# Patient Record
Sex: Female | Born: 1960
Health system: Southern US, Community
[De-identification: ages and names within clinical notes are randomized; demographics above are authoritative.]

## PROBLEM LIST (undated history)

## (undated) DIAGNOSIS — Z803 Family history of malignant neoplasm of breast: Secondary | ICD-10-CM

## (undated) DIAGNOSIS — Z973 Presence of spectacles and contact lenses: Secondary | ICD-10-CM

## (undated) DIAGNOSIS — Z9889 Other specified postprocedural states: Secondary | ICD-10-CM

## (undated) DIAGNOSIS — F419 Anxiety disorder, unspecified: Secondary | ICD-10-CM

## (undated) DIAGNOSIS — Z889 Allergy status to unspecified drugs, medicaments and biological substances status: Secondary | ICD-10-CM

## (undated) DIAGNOSIS — C50919 Malignant neoplasm of unspecified site of unspecified female breast: Secondary | ICD-10-CM

## (undated) DIAGNOSIS — R112 Nausea with vomiting, unspecified: Secondary | ICD-10-CM

## (undated) DIAGNOSIS — Z8042 Family history of malignant neoplasm of prostate: Secondary | ICD-10-CM

## (undated) DIAGNOSIS — Z8041 Family history of malignant neoplasm of ovary: Secondary | ICD-10-CM

## (undated) DIAGNOSIS — Z8489 Family history of other specified conditions: Secondary | ICD-10-CM

## (undated) DIAGNOSIS — G43909 Migraine, unspecified, not intractable, without status migrainosus: Secondary | ICD-10-CM

## (undated) DIAGNOSIS — R011 Cardiac murmur, unspecified: Secondary | ICD-10-CM

## (undated) HISTORY — DX: Family history of malignant neoplasm of prostate: Z80.42

## (undated) HISTORY — DX: Family history of malignant neoplasm of ovary: Z80.41

## (undated) HISTORY — DX: Family history of malignant neoplasm of breast: Z80.3

---

## 1994-02-18 HISTORY — PX: PORT-A-CATH REMOVAL: SHX5289

## 1994-02-18 HISTORY — PX: BREAST SURGERY: SHX581

## 1994-02-18 HISTORY — PX: PORTACATH PLACEMENT: SHX2246

## 1994-02-18 HISTORY — PX: BREAST BIOPSY: SHX20

## 1994-02-18 HISTORY — PX: MASTECTOMY: SHX3

## 1994-02-18 HISTORY — PX: TUNNELED VENOUS CATHETER PLACEMENT: SHX818

## 1998-06-15 ENCOUNTER — Other Ambulatory Visit: Admission: RE | Admit: 1998-06-15 | Discharge: 1998-06-15 | Payer: Self-pay | Admitting: Obstetrics and Gynecology

## 1999-02-19 HISTORY — PX: BREAST RECONSTRUCTION: SHX9

## 1999-03-21 ENCOUNTER — Inpatient Hospital Stay (HOSPITAL_COMMUNITY): Admission: RE | Admit: 1999-03-21 | Discharge: 1999-03-24 | Payer: Self-pay | Admitting: Plastic Surgery

## 1999-03-21 ENCOUNTER — Encounter (INDEPENDENT_AMBULATORY_CARE_PROVIDER_SITE_OTHER): Payer: Self-pay | Admitting: Specialist

## 1999-06-29 ENCOUNTER — Ambulatory Visit (HOSPITAL_BASED_OUTPATIENT_CLINIC_OR_DEPARTMENT_OTHER): Admission: RE | Admit: 1999-06-29 | Discharge: 1999-06-29 | Payer: Self-pay | Admitting: Plastic Surgery

## 1999-07-05 ENCOUNTER — Encounter: Admission: RE | Admit: 1999-07-05 | Discharge: 1999-07-05 | Payer: Self-pay | Admitting: Oncology

## 1999-07-05 ENCOUNTER — Encounter: Payer: Self-pay | Admitting: Oncology

## 1999-07-19 ENCOUNTER — Other Ambulatory Visit: Admission: RE | Admit: 1999-07-19 | Discharge: 1999-07-19 | Payer: Self-pay | Admitting: Obstetrics and Gynecology

## 1999-08-30 ENCOUNTER — Ambulatory Visit (HOSPITAL_COMMUNITY): Admission: RE | Admit: 1999-08-30 | Discharge: 1999-08-30 | Payer: Self-pay | Admitting: Obstetrics and Gynecology

## 2000-06-02 ENCOUNTER — Encounter: Payer: Self-pay | Admitting: Oncology

## 2000-06-02 ENCOUNTER — Encounter: Admission: RE | Admit: 2000-06-02 | Discharge: 2000-06-02 | Payer: Self-pay | Admitting: Oncology

## 2000-06-06 ENCOUNTER — Ambulatory Visit (HOSPITAL_COMMUNITY): Admission: RE | Admit: 2000-06-06 | Discharge: 2000-06-06 | Payer: Self-pay | Admitting: Oncology

## 2000-06-13 ENCOUNTER — Encounter: Payer: Self-pay | Admitting: Neurology

## 2000-06-13 ENCOUNTER — Ambulatory Visit (HOSPITAL_COMMUNITY): Admission: RE | Admit: 2000-06-13 | Discharge: 2000-06-13 | Payer: Self-pay | Admitting: Neurology

## 2000-10-30 ENCOUNTER — Encounter: Payer: Self-pay | Admitting: Oncology

## 2000-10-30 ENCOUNTER — Encounter: Admission: RE | Admit: 2000-10-30 | Discharge: 2000-10-30 | Payer: Self-pay | Admitting: Oncology

## 2001-01-05 ENCOUNTER — Other Ambulatory Visit: Admission: RE | Admit: 2001-01-05 | Discharge: 2001-01-05 | Payer: Self-pay | Admitting: Obstetrics and Gynecology

## 2001-11-02 ENCOUNTER — Encounter: Admission: RE | Admit: 2001-11-02 | Discharge: 2001-11-02 | Payer: Self-pay | Admitting: Oncology

## 2001-11-02 ENCOUNTER — Encounter: Payer: Self-pay | Admitting: Oncology

## 2002-02-10 ENCOUNTER — Other Ambulatory Visit: Admission: RE | Admit: 2002-02-10 | Discharge: 2002-02-10 | Payer: Self-pay | Admitting: Obstetrics and Gynecology

## 2002-11-08 ENCOUNTER — Encounter: Payer: Self-pay | Admitting: Oncology

## 2002-11-08 ENCOUNTER — Encounter: Admission: RE | Admit: 2002-11-08 | Discharge: 2002-11-08 | Payer: Self-pay | Admitting: Oncology

## 2002-11-18 ENCOUNTER — Encounter: Admission: RE | Admit: 2002-11-18 | Discharge: 2002-11-18 | Payer: Self-pay | Admitting: Oncology

## 2002-11-18 ENCOUNTER — Encounter: Payer: Self-pay | Admitting: Oncology

## 2003-02-23 ENCOUNTER — Other Ambulatory Visit: Admission: RE | Admit: 2003-02-23 | Discharge: 2003-02-23 | Payer: Self-pay | Admitting: Obstetrics and Gynecology

## 2003-05-06 ENCOUNTER — Encounter: Admission: RE | Admit: 2003-05-06 | Discharge: 2003-05-06 | Payer: Self-pay | Admitting: Oncology

## 2003-11-09 ENCOUNTER — Encounter: Admission: RE | Admit: 2003-11-09 | Discharge: 2003-11-09 | Payer: Self-pay | Admitting: Oncology

## 2004-02-24 ENCOUNTER — Ambulatory Visit: Payer: Self-pay | Admitting: Oncology

## 2004-03-20 ENCOUNTER — Other Ambulatory Visit: Admission: RE | Admit: 2004-03-20 | Discharge: 2004-03-20 | Payer: Self-pay | Admitting: Obstetrics and Gynecology

## 2004-06-26 ENCOUNTER — Encounter: Admission: RE | Admit: 2004-06-26 | Discharge: 2004-06-26 | Payer: Self-pay | Admitting: Oncology

## 2004-08-27 ENCOUNTER — Ambulatory Visit: Payer: Self-pay | Admitting: Oncology

## 2005-03-29 ENCOUNTER — Other Ambulatory Visit: Admission: RE | Admit: 2005-03-29 | Discharge: 2005-03-29 | Payer: Self-pay | Admitting: Obstetrics and Gynecology

## 2005-06-27 ENCOUNTER — Encounter: Admission: RE | Admit: 2005-06-27 | Discharge: 2005-06-27 | Payer: Self-pay | Admitting: Oncology

## 2005-07-18 ENCOUNTER — Encounter: Admission: RE | Admit: 2005-07-18 | Discharge: 2005-07-18 | Payer: Self-pay | Admitting: Oncology

## 2005-08-15 ENCOUNTER — Ambulatory Visit: Payer: Self-pay | Admitting: Oncology

## 2005-08-23 LAB — COMPREHENSIVE METABOLIC PANEL
ALT: 15 U/L (ref 0–40)
AST: 13 U/L (ref 0–37)
Albumin: 4.5 g/dL (ref 3.5–5.2)
Alkaline Phosphatase: 54 U/L (ref 39–117)
Calcium: 9 mg/dL (ref 8.4–10.5)
Chloride: 103 mEq/L (ref 96–112)
Potassium: 4.1 mEq/L (ref 3.5–5.3)
Sodium: 138 mEq/L (ref 135–145)
Total Protein: 7.6 g/dL (ref 6.0–8.3)

## 2005-08-23 LAB — CBC WITH DIFFERENTIAL/PLATELET
Basophils Absolute: 0 10*3/uL (ref 0.0–0.1)
EOS%: 1.4 % (ref 0.0–7.0)
HCT: 36.8 % (ref 34.8–46.6)
MCHC: 33.5 g/dL (ref 32.0–36.0)
MONO#: 0.5 10*3/uL (ref 0.1–0.9)
MONO%: 8.6 % (ref 0.0–13.0)
Platelets: 302 10*3/uL (ref 145–400)
WBC: 5.3 10*3/uL (ref 3.9–10.0)

## 2006-06-30 ENCOUNTER — Encounter: Admission: RE | Admit: 2006-06-30 | Discharge: 2006-06-30 | Payer: Self-pay | Admitting: Oncology

## 2006-07-08 ENCOUNTER — Encounter: Admission: RE | Admit: 2006-07-08 | Discharge: 2006-07-08 | Payer: Self-pay | Admitting: Oncology

## 2006-08-19 ENCOUNTER — Ambulatory Visit: Payer: Self-pay | Admitting: Oncology

## 2006-08-27 LAB — CBC WITH DIFFERENTIAL/PLATELET
BASO%: 0.5 % (ref 0.0–2.0)
Basophils Absolute: 0 10*3/uL (ref 0.0–0.1)
EOS%: 4.7 % (ref 0.0–7.0)
HGB: 11.8 g/dL (ref 11.6–15.9)
MCH: 30.9 pg (ref 26.0–34.0)
MCHC: 34.5 g/dL (ref 32.0–36.0)
MCV: 89.7 fL (ref 81.0–101.0)
MONO%: 9.7 % (ref 0.0–13.0)
RBC: 3.8 10*6/uL (ref 3.70–5.32)
RDW: 14.2 % (ref 11.3–14.5)
lymph#: 1.6 10*3/uL (ref 0.9–3.3)

## 2006-08-27 LAB — COMPREHENSIVE METABOLIC PANEL
ALT: 13 U/L (ref 0–35)
AST: 14 U/L (ref 0–37)
Albumin: 4.1 g/dL (ref 3.5–5.2)
Alkaline Phosphatase: 51 U/L (ref 39–117)
BUN: 12 mg/dL (ref 6–23)
Potassium: 4.2 mEq/L (ref 3.5–5.3)

## 2007-07-02 ENCOUNTER — Encounter: Admission: RE | Admit: 2007-07-02 | Discharge: 2007-07-02 | Payer: Self-pay | Admitting: Oncology

## 2007-07-15 ENCOUNTER — Ambulatory Visit: Payer: Self-pay | Admitting: Oncology

## 2007-07-17 ENCOUNTER — Encounter: Admission: RE | Admit: 2007-07-17 | Discharge: 2007-07-17 | Payer: Self-pay | Admitting: Oncology

## 2007-08-24 ENCOUNTER — Ambulatory Visit: Payer: Self-pay | Admitting: Oncology

## 2007-08-26 LAB — CBC WITH DIFFERENTIAL/PLATELET
BASO%: 0.2 % (ref 0.0–2.0)
EOS%: 2 % (ref 0.0–7.0)
HCT: 34.5 % — ABNORMAL LOW (ref 34.8–46.6)
LYMPH%: 30 % (ref 14.0–48.0)
MCH: 30 pg (ref 26.0–34.0)
MCHC: 33.6 g/dL (ref 32.0–36.0)
MCV: 89.3 fL (ref 81.0–101.0)
MONO#: 0.4 10*3/uL (ref 0.1–0.9)
MONO%: 8 % (ref 0.0–13.0)
NEUT%: 59.8 % (ref 39.6–76.8)
Platelets: 281 10*3/uL (ref 145–400)

## 2007-08-26 LAB — COMPREHENSIVE METABOLIC PANEL
ALT: 13 U/L (ref 0–35)
CO2: 21 mEq/L (ref 19–32)
Creatinine, Ser: 0.8 mg/dL (ref 0.40–1.20)
Total Bilirubin: 0.8 mg/dL (ref 0.3–1.2)

## 2008-07-04 ENCOUNTER — Encounter: Admission: RE | Admit: 2008-07-04 | Discharge: 2008-07-04 | Payer: Self-pay | Admitting: Oncology

## 2008-09-05 ENCOUNTER — Ambulatory Visit: Payer: Self-pay | Admitting: Oncology

## 2008-09-07 LAB — CBC WITH DIFFERENTIAL/PLATELET
BASO%: 0.5 % (ref 0.0–2.0)
EOS%: 1.8 % (ref 0.0–7.0)
HCT: 36.6 % (ref 34.8–46.6)
LYMPH%: 36.8 % (ref 14.0–49.7)
MCH: 30.1 pg (ref 25.1–34.0)
MCHC: 33.2 g/dL (ref 31.5–36.0)
NEUT%: 52.5 % (ref 38.4–76.8)
Platelets: 312 10*3/uL (ref 145–400)
RBC: 4.03 10*6/uL (ref 3.70–5.45)

## 2008-09-07 LAB — COMPREHENSIVE METABOLIC PANEL
ALT: 17 U/L (ref 0–35)
AST: 21 U/L (ref 0–37)
Alkaline Phosphatase: 61 U/L (ref 39–117)
Creatinine, Ser: 0.81 mg/dL (ref 0.40–1.20)
Sodium: 138 mEq/L (ref 135–145)
Total Bilirubin: 0.6 mg/dL (ref 0.3–1.2)

## 2008-09-30 ENCOUNTER — Ambulatory Visit (HOSPITAL_COMMUNITY): Admission: RE | Admit: 2008-09-30 | Discharge: 2008-09-30 | Payer: Self-pay | Admitting: Oncology

## 2009-02-18 HISTORY — PX: ABDOMINAL HYSTERECTOMY: SHX81

## 2009-07-06 ENCOUNTER — Encounter: Admission: RE | Admit: 2009-07-06 | Discharge: 2009-07-06 | Payer: Self-pay | Admitting: Oncology

## 2009-08-22 ENCOUNTER — Ambulatory Visit (HOSPITAL_COMMUNITY): Admission: RE | Admit: 2009-08-22 | Discharge: 2009-08-22 | Payer: Self-pay | Admitting: Anesthesiology

## 2009-08-24 ENCOUNTER — Inpatient Hospital Stay (HOSPITAL_COMMUNITY): Admission: RE | Admit: 2009-08-24 | Discharge: 2009-08-26 | Payer: Self-pay | Admitting: Obstetrics and Gynecology

## 2009-08-24 ENCOUNTER — Encounter (INDEPENDENT_AMBULATORY_CARE_PROVIDER_SITE_OTHER): Payer: Self-pay | Admitting: Obstetrics and Gynecology

## 2009-09-04 ENCOUNTER — Ambulatory Visit: Payer: Self-pay | Admitting: Oncology

## 2009-09-06 LAB — COMPREHENSIVE METABOLIC PANEL
ALT: 15 U/L (ref 0–35)
AST: 17 U/L (ref 0–37)
Calcium: 9.8 mg/dL (ref 8.4–10.5)
Potassium: 4 mEq/L (ref 3.5–5.3)
Sodium: 139 mEq/L (ref 135–145)
Total Bilirubin: 1.2 mg/dL (ref 0.3–1.2)

## 2009-09-06 LAB — CBC WITH DIFFERENTIAL/PLATELET
Basophils Absolute: 0 10*3/uL (ref 0.0–0.1)
Eosinophils Absolute: 0.1 10*3/uL (ref 0.0–0.5)
MCH: 29.2 pg (ref 25.1–34.0)
MCV: 91 fL (ref 79.5–101.0)
MONO#: 0.3 10*3/uL (ref 0.1–0.9)
NEUT#: 4.6 10*3/uL (ref 1.5–6.5)
RDW: 13.3 % (ref 11.2–14.5)
lymph#: 1.6 10*3/uL (ref 0.9–3.3)

## 2009-10-06 ENCOUNTER — Encounter: Admission: RE | Admit: 2009-10-06 | Discharge: 2009-10-06 | Payer: Self-pay | Admitting: Oncology

## 2010-03-10 ENCOUNTER — Other Ambulatory Visit: Payer: Self-pay | Admitting: Oncology

## 2010-03-10 DIAGNOSIS — Z853 Personal history of malignant neoplasm of breast: Secondary | ICD-10-CM

## 2010-03-10 DIAGNOSIS — Z0000 Encounter for general adult medical examination without abnormal findings: Secondary | ICD-10-CM

## 2010-04-24 ENCOUNTER — Other Ambulatory Visit: Payer: Self-pay | Admitting: Obstetrics and Gynecology

## 2010-05-06 LAB — URINALYSIS, ROUTINE W REFLEX MICROSCOPIC
Bilirubin Urine: NEGATIVE
Glucose, UA: NEGATIVE mg/dL
Specific Gravity, Urine: >1.03 — ABNORMAL HIGH (ref 1.005–1.030)
pH: 5.5 (ref 5.0–8.0)

## 2010-05-06 LAB — SURGICAL PCR SCREEN: Staphylococcus aureus: NEGATIVE

## 2010-05-06 LAB — COMPREHENSIVE METABOLIC PANEL
Albumin: 3.8 g/dL (ref 3.5–5.2)
Alkaline Phosphatase: 51 U/L (ref 39–117)
Calcium: 8.9 mg/dL (ref 8.4–10.5)
Chloride: 106 mEq/L (ref 96–112)
GFR calc Af Amer: >60 mL/min (ref >60)
GFR calc non Af Amer: >60 mL/min (ref >60)
Potassium: 3.6 mEq/L (ref 3.5–5.1)

## 2010-05-06 LAB — CBC
Hemoglobin: 10.9 g/dL — ABNORMAL LOW (ref 12.0–15.0)
Hemoglobin: 11.4 g/dL — ABNORMAL LOW (ref 12.0–15.0)
MCH: 30.3 pg (ref 26.0–34.0)
Platelets: 212 10*3/uL (ref 150–400)
Platelets: 258 10*3/uL (ref 150–400)
RBC: 3.51 MIL/uL — ABNORMAL LOW (ref 3.87–5.11)
RBC: 3.75 MIL/uL — ABNORMAL LOW (ref 3.87–5.11)
RDW: 13.7 % (ref 11.5–15.5)

## 2010-05-06 LAB — URINE MICROSCOPIC-ADD ON

## 2010-05-06 LAB — APTT: aPTT: 30 seconds (ref 24–37)

## 2010-05-06 LAB — PREGNANCY, URINE: Preg Test, Ur: NEGATIVE

## 2010-05-06 LAB — PROTIME-INR: INR: 1.08 (ref 0.00–1.49)

## 2010-07-06 NOTE — Op Note (Signed)
[facility]  Patient:    Heidi Sexton, Heidi Sexton                   MRN: [id number] Proc. Date: 08/30/99 Adm. Date:  [id number] Attending:  Miguel Aschoff                           Operative Report  PREOPERATIVE DIAGNOSIS:       Intrauterine pregnancy at 10 weeks for indicated termination secondary to the patients breast carcinoma.  Desired sterilization.  POSTOPERATIVE DIAGNOSIS:      Intrauterine pregnancy at 10 weeks for indicated termination secondary to the patients breast carcinoma.  Desired sterilization.  OPERATION:                    Suction curettage followed by laparoscopic tubal sterilization using cautery and division.  SURGEON:                      Miguel Aschoff, M.D.  ASSISTANT:  ANESTHESIA:                   General anesthesia.  COMPLICATIONS:                None.  INDICATIONS:                  The patient is a 50 year old black female approximately [redacted] weeks pregnant, now 4-1/2 years out from treatment for breast carcinoma with high dose chemotherapy and radiation.  In view of her breast carcinoma status and fears that an ongoing pregnancy could increase chances for  recurrence, the patient is now being taken to undergo elective termination of pregnancy.  In addition, she has requested that a sterilization procedure be performed at this time and has given her informed consent for tubal sterilization.  DESCRIPTION OF PROCEDURE:     The patient was taken to the operating room and placed in the supine position.  General anesthesia was administered without difficulty.  She was then placed in the dorsal lithotomy position and prepped and draped in the usual sterile fashion.  The bladder was catheterized.  A speculum was placed in the vaginal vault.  The anterior cervical lip was grasped with a tenaculum and then the endocervical canal was dilated using serial Pratt dilators until a #33 Pratt dilator could be passed.  Then using a #10 vacuum  curet, the contents of the uterine cavity were evacuated without difficulty.  Sharp curettage was carried out following this with only a small amount of additional tissue being obtained.  Then a final pass was made with the suction curet.  After this portion of the procedure was completed, a Hulka tenaculum was placed through the cervix and held.  Attention was then directed to the umbilicus where a small infraumbilical incision was made.  The Veress needle was then inserted and the abdomen was insufflated with 3 liters of CO2.  After this was done, the trocar to the laparoscope was placed.  The laparoscope was then introduced.  Systematic inspection revealed the uterus to be globular, now top normal size.  The bladder peritoneum was unremarkable.  The round ligaments were unremarkable.  The tubes  were normal along their course.  Fimbria were fine and delicate.  The ovaries were totally within normal limits.  The cul-de-sac was unremarkable.  The only remarkable finding was the inspection of the upper abdomen revealed banjo-type string adhesions running  from the surface of the liver to the anterior abdominal wall consistent with Curtis-Fitz-Hugh, hepatitis syndrome.  No lesions were noted within the liver.  At this point a second puncture was placed suprapubically and a 5 mm port was established.  Then the bipolar cautery forceps were introduced and the midportion of each tube was cauterized for 3 cm along their course and then  this area of cauterization was divided using laparoscopic scissors with good hemostasis and good separation.  Once this was done, a final inspection was made for hemostasis which was excellent.  The procedure was completed.  The CO2 was allowed to escape.  All instruments were removed and the incisions were closed using subcuticular 4-0 Vicryl.  The plan is for the patient to be discharged home.  Medications for home include Doxycycline 100 mg twice  a day x 3 days and Tylox one every three hours as needed for pain.  The patient is to call if there are any problems such as fever, pain, or heavy bleeding.  She is to follow up in four weeks for follow-up examination. DD:  08/30/99 TD:  08/30/99 Job: [id number]

## 2010-07-06 NOTE — Op Note (Signed)
[place]. [facility]  Patient:    Heidi Sexton, Heidi Sexton                   MRN: [id number] Proc. Date: 06/29/99 Adm. Date:  [id number] Disc. Date: [id number] Attending:  Loura Halt Ii                           Operative Report  PREOPERATIVE DIAGNOSES: 1. Absence of left nipple areolar complex. 2. History of breast cancer with mastectomy previously performed.  POSTOPERATIVE DIAGNOSES: 1. Absence of left nipple areolar complex. 2. History of breast cancer with mastectomy previously performed.  OPERATION PERFORMED:  Left nipple reconstruction.  SURGEON:  Alfredia Ferguson, M.D.  ANESTHESIA:  None required.  INDICATIONS FOR SURGERY:  This is a 50 year old woman who has previously had a mastectomy and radiation therapy.  She had significant radiation injury to her skin and subsequently underwent resection of the radiated skin and placement of a TRAM flap.  The patient has done quite well and is now ready for her nipple reconstruction.  The left breast was marked with a Band-Aid with the patient in a sitting position for location of the nipple related to her opposite side.  DESCRIPTION OF SURGERY:  A 45 mm diameter circle was drawn in the central portion of the reconstructed breast where the Band-Aid had been placed.  The left reconstructed breast was then prepped and draped in a sterile fashion.  A tripartite flap was marked, which was inferiorly based, using a 2.5 cm random skin attachment.  The three point of the flap were pointing to the 3 oclock, 12 oclock, and 9 oclock positions.  The flap was incised down to the remaining connection of the skin with the three points appearing like the points on a star.  The tips of each of the points were now excised.  These were skin and subcutaneous tissue flaps.  The 3 oclock and 9 oclock flaps were rolled towards one another and fixed with one another using interrupted 4-0 chromic suture.  This created a  cylinder.  The 12 oclock flap was then laid down upon the top of this cylinder and fixed into position using interrupted 4-0 chromic.  This created the nipple.  The donor site was then closed using multiple, interrupted, 4-0 Vicryl suture for the dermis.  The skin was united using a running 4-0 chromic suture.  The viability of the nipple flap appeared to be excellent at the conclusion of the procedure.  A bulky dressing was applied.  The patient was discharged to home in satisfactory condition. DD:  06/29/99 TD:  07/02/99 Job: [id number]

## 2010-07-06 NOTE — H&P (Signed)
[place]. [facility]  Patient:    Heidi Sexton                    MRN: [id number] Adm. Date:  [id number] Attending:  Loura Halt Ii                         History and Physical  ADMISSION DIAGNOSES: 1. Acquired absence, left breast. 2. Personal history of breast cancer.  CHIEF COMPLAINT:  "My left breast has been removed and I wish to have it reconstructed."  HISTORY OF PRESENT ILLNESS:  This is a 50 year old black female, admitted for left breast reconstruction.  The patient states that she had breast cancer in 1996 and underwent a left modified radical mastectomy.  She had 13 of 23 lymph nodes positive for metastatic disease.  Patient subsequently underwent five cycles of  Adriamycin and Cytoxan, followed by high-dose chemotherapy and bone marrow rescue. Patient had radiation therapy to the left chest wall and axilla.  Patient has had no evidence of disease since 1996.  She is doing well, active and wishes to proceed with delayed breast reconstruction.  PAST MEDICAL HISTORY:  Negative other than those presented in history of present illness.  PAST SURGICAL HISTORY:  Significant for a left modified radical mastectomy and axillary dissection in 1996.  ALLERGIES:  Patient is allergic to TAPE.  SOCIAL HISTORY:  Patient is married and has one child who is 5 years old.  She interestingly was pregnant when she developed breast cancer and the baby remained in utero during two cycles of her chemotherapy.  Patient denies smoking.  REVIEW OF SYSTEMS:  Patient denies any history of diarrhea or constipation. She denies any history of hematuria or dysuria.  She has had no recent weight loss.  She denies chest pain, shortness of breath or dyspnea on exertion.  There is no  history of weakness in her extremities, although she does have some residual numbness on her chest wall.  Patient denies any psychiatric illnesses, visual problems or  dermatologic problems.  Patient has occasional jaw pain which may be related to a history of TMJ problems.  FAMILY HISTORY:  There is no significant family history of breast cancer.  She as a distant relative who had breast cancer on her fathers side.  She has a cousin  who had a brain tumor and an uncle who had leukemia.  PHYSICAL EXAMINATION:  GENERAL:  Physical exam reveals a woman who is 5-feet 7-inches tall and weighs 57 pounds.  HEENT:  Within normal limits.  CHEST:  Clear to auscultation.  CARDIAC:  Normal S1 and S2, without murmurs or gallops.  BREASTS:  Exam reveals a slight ptotic B-cup breast on the right with no dominant masses.  On the left, there is surgical absence of the breast.  The skin is densely adherent to the chest wall secondary to radiation therapy.  There is a scar contracture which extends up into the axilla, which is a continuation of her mastectomy incision.  There is no axillary adenopathy in either side.  ABDOMEN:  Exam reveals no evidence of masses, tenderness, diastasis or herniae.  There is a very small panniculus.  ASSESSMENT ON ADMISSION:  Acquired absence of the left breast.  PLAN:  Delayed TRAM flap reconstruction. DD:  03/21/99 TD:  03/21/99 Job: [id number]

## 2010-07-06 NOTE — Discharge Summary (Signed)
[place]. [facility]  Patient:    Heidi Sexton                    MRN: [id number] Adm. Date:  [id number] Disc. Date: [id number] Attending:  Loura Halt Ii                           Discharge Summary  ADMISSION DIAGNOSES: 1. Acquired absence left breast. 2. Personal history of left breast cancer.  DISCHARGE DIAGNOSES: 1. Acquired absence left breast. 2. Personal history of left breast cancer.  OPERATIONS:  Delayed breast reconstruction utilizing a left transverse rectus abdominis myocutaneous flap performed on March 21, 1999.  CHIEF COMPLAINT:  "My left breast has been removed, and I wish to have a reconstruction."  HISTORY OF PRESENT ILLNESS:  [age]-year-old black female admitted to the hospital for delayed breast reconstruction.  She had breast cancer in 1996, which was treated with mastectomy, radiation therapy, and chemotherapy.  She had 13 positive nodes at the time of mastectomy.  She has been disease-free for the last four years, and wishes now to proceed with breast reconstruction.  She has a very small abdominal panniculus, and knows that we will not be able to make her the exact same size as the opposite side.  PAST MEDICAL HISTORY:  Negative, other than that presented within the history of present illness.  PAST SURGICAL HISTORY:  Left modified radical mastectomy in 1996.  ALLERGIES:  TAPE.  SOCIAL HISTORY:  The patient is married, has one child.  She does not smoke.  REVIEW OF SYSTEMS:  Please see admission history and physical for review of systems.  PHYSICAL EXAMINATION:  Please see admission H&P for complete physical exam.  ADMISSION LABORATORY DATA:  Hemoglobin of 11.8, hematocrit of 36.5.  Urinalysis was negative.  No EKG was performed.  No chest x-ray was performed.  HOSPITAL COURSE:  On the day of admission, the patient was taken to the operating room where she underwent an uncomplicated  transverse rectus abdominis myocutaneous flap.  Postoperative course has been completely uncomplicated.  On the first postoperative day the patient was started on a diet, her IV was discontinued, and she was switched to oral medications.  On the second postoperative day the patient is ambulating without assistance.  She has three remaining drains in place, with she will go home with.  She is tolerating her oral medications.  She did have some transient nausea on the afternoon of discharge.  She also developed a bit of anemia postoperatively.  Her hemoglobin and hematocrit were 9.0 and 26.6 at discharge.  DISCHARGE MEDICATIONS: 1. Tylox 1-2 q.4h. p.r.n. pain. 2. Keflex 500 mg four times a day. 3. She was advised to take iron and multivitamins once at home.  DISCHARGE INSTRUCTIONS:  Discharge instructions were given to the patient which  included emptying and recording the drain output twice a day.  She was instructed on doing dressing changes around the drain.  However, no other dressing changes  were required.  FOLLOW-UP:  Will be provided in six days in my office.  If her drainage reduces to below 30 cc per 24 hours, the patient was told that she could call the office, nd we would be happy to remove the drains prior to her scheduled appointment.  The patient understands her discharge instructions, and is willing to comply. DD:  03/23/99 TD:  03/24/99 Job: [id number]

## 2010-07-06 NOTE — Op Note (Signed)
[place]. [facility]  Patient:    Heidi Sexton                    MRN: [id number] Proc. Date: 03/21/99 Adm. Date:  [id number] Attending:  Loura Halt Ii                           Operative Report  PREOPERATIVE DIAGNOSES: 1. Acquired absence of left breast. 2. Severe radiation injury to skin of left anterior chest. 3. History of breast cancer.  POSTOPERATIVE DIAGNOSES: 1. Acquired absence of left breast. 2. Severe radiation injury to skin of left anterior chest. 3. History of breast cancer.  PROCEDURES: 1. Excision of radiated skin, left anterior chest with recreation of mastectomy  flaps. 2. Delayed reconstruction utilizing a left epsilateral transverse rectus abdominis    myocutaneous flap.  SURGEON:  Alfredia Ferguson, M.D.  ASSISTANT:  Janet Berlin. Dan Humphreys, M.D.  ANESTHESIA:  General endotracheal anesthesia.  INDICATIONS:  This is a 50 year old black female, who is 4 years status post left modified radical mastectomy with 13 positive nodes.  She was treated with radiation therapy and chemotherapy.  She is showing no evidence of disease and wishes to undergo delayed reconstruction of her breast.  Patient is noted to have severe tightness of the skin of her chest with dense adherence to the anterior chest wall. The inferior half of the pectoralis muscle was atrophied and the skin is stuck o the ribs.  The scar continues up into the anterior axilla with a contracture. Patient wishes to have this scar released and reconstruction of her breast. She understands that the amount of tissue she has in her abdomen is inadequate to reconstruct a breast of same size as the opposite side.  In addition, because she is so small, there is inadequate amount of skin to replace all of the radiation  injured skin of the chest.  She understands the risk of partial or complete loss of the flap, being made too small, being made too large, wound  healing difficulties, abdominal hernias, vascular compromise to the naval and wound breakdown both in the chest and the abdomen.  Inspite of these and other risks discussed with the patient, she wished to proceed with the operation.  DESCRIPTION OF SURGERY:  Skin marks were placed outlining the maximum skin paddle dimensions of her abdomen the day prior to surgery.  The patient was taken to the operating room, where she was given general endotracheal anesthesia.  The attention was first directed to the left anterior chest.  The skin that was densely adherent and atrophied that was stuck to the ribs was excised.  This was approximately 4 cm in width and extended from the medial inferior corner of the left anterior chest up to the anterior axilla.  Following removal of this skin using sharp dissection,  essentially peeling it off the ribs, the inferior and superior breast flaps were elevated.  These were densely adherent inferiorly.  Superiorly the upper half of the pectoralis muscle was still present and was a bit easier to elevate the superior breast flap.  Essentially, I elevated the breast flaps back to virgin territory, where it had not been previously dissected.  This allowed marked release of the chest wall skin.  In doing this, the wound became quite wide, approximately 12-14 cm in width as it diagonally stretched from inferior medial corner to the  anterior axilla.  Following recreation  of the mastectomy flaps, a 10 mm blake drain was placed in the axillary gutter and brought out through a separate stab incision and moist packs were placed in the mastectomy defect.  Attention was now directed to the abdomen.  The upper skin incision of the skin paddle was made and dissected down to the anterior abdominal fascia.  The anterior abdominal skin flap was elevated to the level of the costal margins bilaterally in the xiphoid in the midline.  A circular incision was now  made around the umbilicus and the umbilicus along with a healthy stalk of fatty tissue was dissected away from the skin paddle. Patient was temporarily placed in a sitting position in order to assure that the upper abdominal flap could close to the anticipated line of incision for the inferior skin paddle.  Once I was certain that it could close without undue tension, the patient was returned to a supine position.  The lower skin incision was made and deepened until reaching the anterior abdominal wall fascia.  I opted to use an epsilateral muscle.  The right corner of the skin paddle was elevated off the anterior abdominal wall fascia until crossing the linea alba 1 cm to the left of midline.  The left corner of the skin paddle was elevated until reaching the  lateral row of rectus perforators, which was approximately 3 cm medial to the lateral rectus border.  Two parallel incisions were now made in the rectus fascia beginning at the costal margins superiorly.  The medial incision was continued inferiorly skirting along the medial connection of the skin paddle to the anterior rectus fascia.  Upon reaching the inferior connection of the skin paddle of the  rectus fascia, the incision through the rectus fascia was carried laterally, again skirting along the inferior connection of the skin paddle.  The lateral rectus incision was carried inferiorly, skirting along the lateral connection of the skin paddle until connecting with the previously made transverse opening in the rectus fascia at the inferior border of the skin paddle.  A strip of rectus muscle was  left in between these two parallel incisions, which was approximately 2 cm in width.  The rectus muscle was now dissected out of its anatomic bed, elevating he rectus fascia off of the rectus muscle with the exception of the strip of rectus in the middle.  Once the anterior portion of the rectus muscle had been freed,  the  deep portion of the rectus muscle was freed off the posterior rectus fascia. The deep inferior epigastric vessels were visualized inferiorly.  The artery was isolated, clamped and ligated with hemoclips.  The two veins were likewise ligated.  The rectus muscle was now divided just below the arcuate line.  A tunnel was created between the mastectomy dissection and abdominal dissection.  The skin paddle along with its connected muscle was now tunnelled up to the mastectomy defect and temporarily stapled in position.  The abdominal wound was copiously irrigated with warm saline irrigation and hemostasis was assured using electrocautery.  The anterior rectus fascia was closed using multiple interrupted 0 Prolene in a figure-of-eight, buried fashion.  Marlex mesh was placed as an onlay graft over the rectus closure.  The Marlex mesh was approximately 10 inches in height and 4 inches in width.  It was fixed in position using a running 2-0 Prolene along the periphery of the mesh.  An opening in the central portion of the mesh was made and the umbilicus was brought  through this opening.  Two Blake drains were  placed and brought out through separate stab incisions for the abdominal drainage. Patient was now placed in a semi Fowlers position.  The abdominal wound was closed by approximating the dermis in midline using interrupted 0 Vicryl suture.  A new opening was made in the midline for the umbilicus.  Then the umbilicus was brought through this opening and fixed in position using multiple interrupted 4-0 Vicryl sutures.  The remainder of the abdominal wound was closed using a combination of 2-0 and 3-0 Vicryl sutures for the dermis. The skin was united using Steri-Strips. Attention was redirected to the left breast.  The medial corner (zone 4) of the  skin paddle was now excised.  There was very brisk bleeding coming from this, which assured me that the flap should be fine.   Because the width of the mastectomy defect was so great, I was unable to bury any of the TRAM beneath the superior breast flap.  For this reason, the TRAM was fixed in position in an end-to-end ype closure, uniting the upper border of the skin paddle to the upper border of the  breast flap using interrupted 3-0 Vicryl sutures.  A running 3-0 subcuticular was then placed in the upper skin paddle/breast flap incision.  The inferior skin paddle was united with the lower breast flap in a similar fashion. Steri-Strips were then applied.  The flap looked good, was warm, had good capillary refill. The area was cleansed and Steri-Strips were applied.  A light dressing was applied o breast and the abdomen.  The patient was extubated and transported to the recovery room in satisfactory condition.  Estimated blood loss was between 400 and 500 cc. DD:  03/21/99 TD:  03/21/99 Job: [id number]

## 2010-07-09 ENCOUNTER — Ambulatory Visit: Payer: Self-pay

## 2010-07-09 ENCOUNTER — Inpatient Hospital Stay: Admission: RE | Admit: 2010-07-09 | Payer: Self-pay | Source: Ambulatory Visit

## 2010-07-25 ENCOUNTER — Ambulatory Visit
Admission: RE | Admit: 2010-07-25 | Discharge: 2010-07-25 | Disposition: A | Discharge status: Home / Self Care | Payer: 59 | Source: Ambulatory Visit | Attending: Oncology | Admitting: Oncology

## 2010-07-25 DIAGNOSIS — Z853 Personal history of malignant neoplasm of breast: Secondary | ICD-10-CM

## 2010-07-25 DIAGNOSIS — Z0000 Encounter for general adult medical examination without abnormal findings: Secondary | ICD-10-CM

## 2010-08-16 ENCOUNTER — Other Ambulatory Visit: Payer: Self-pay | Admitting: Oncology

## 2010-08-16 DIAGNOSIS — Z1501 Genetic susceptibility to malignant neoplasm of breast: Secondary | ICD-10-CM

## 2010-08-16 DIAGNOSIS — Z9012 Acquired absence of left breast and nipple: Secondary | ICD-10-CM

## 2010-08-16 DIAGNOSIS — R922 Inconclusive mammogram: Secondary | ICD-10-CM

## 2010-09-18 ENCOUNTER — Other Ambulatory Visit: Payer: Self-pay | Admitting: Oncology

## 2010-09-18 ENCOUNTER — Encounter (HOSPITAL_BASED_OUTPATIENT_CLINIC_OR_DEPARTMENT_OTHER): Payer: 59 | Admitting: Oncology

## 2010-09-18 DIAGNOSIS — Z853 Personal history of malignant neoplasm of breast: Secondary | ICD-10-CM

## 2010-09-18 LAB — CBC WITH DIFFERENTIAL/PLATELET
BASO%: 0.2 % (ref 0.0–2.0)
EOS%: 2.1 % (ref 0.0–7.0)
HCT: 35.8 % (ref 34.8–46.6)
MCH: 28.1 pg (ref 25.1–34.0)
MCHC: 32.7 g/dL (ref 31.5–36.0)
MONO#: 0.4 10*3/uL (ref 0.1–0.9)
NEUT%: 40.8 % (ref 38.4–76.8)
RBC: 4.17 10*6/uL (ref 3.70–5.45)
RDW: 13.6 % (ref 11.2–14.5)
WBC: 4.3 10*3/uL (ref 3.9–10.3)
lymph#: 2.1 10*3/uL (ref 0.9–3.3)

## 2010-09-18 LAB — COMPREHENSIVE METABOLIC PANEL
ALT: 27 U/L (ref 0–35)
AST: 26 U/L (ref 0–37)
Calcium: 9.4 mg/dL (ref 8.4–10.5)
Chloride: 106 mEq/L (ref 96–112)
Creatinine, Ser: 0.81 mg/dL (ref 0.50–1.10)
Sodium: 139 mEq/L (ref 135–145)
Total Protein: 7.2 g/dL (ref 6.0–8.3)

## 2010-10-05 ENCOUNTER — Ambulatory Visit
Admission: RE | Admit: 2010-10-05 | Discharge: 2010-10-05 | Disposition: A | Discharge status: Home / Self Care | Payer: 59 | Source: Ambulatory Visit | Attending: Oncology | Admitting: Oncology

## 2010-10-05 DIAGNOSIS — Z1501 Genetic susceptibility to malignant neoplasm of breast: Secondary | ICD-10-CM

## 2010-10-05 DIAGNOSIS — Z9012 Acquired absence of left breast and nipple: Secondary | ICD-10-CM

## 2010-10-05 DIAGNOSIS — R922 Inconclusive mammogram: Secondary | ICD-10-CM

## 2010-10-05 MED ORDER — GADOBENATE DIMEGLUMINE 529 MG/ML IV SOLN
19.0000 mL | Freq: Once | INTRAVENOUS | Status: AC | PRN
  Administered 2010-10-05: 19 mL via INTRAVENOUS

## 2011-03-23 ENCOUNTER — Telehealth: Payer: Self-pay | Admitting: *Deleted

## 2011-03-23 NOTE — Telephone Encounter (Signed)
left message to inform the patient of the new date and time on 09-18-2011 starting at 9:00am with dr.[email or web address]

## 2011-03-26 ENCOUNTER — Telehealth: Payer: Self-pay | Admitting: Oncology

## 2011-03-26 NOTE — Telephone Encounter (Signed)
S/w the pt and she is aware of her July 2013 appts °

## 2011-05-27 ENCOUNTER — Telehealth: Payer: Self-pay

## 2011-05-27 NOTE — Telephone Encounter (Signed)
FAXED COMPLETED AND SIGNED DISEASE STATUS FORM FOR [facility]  FOR ANNUAL F/U OF REGISTERED PATIENTS.

## 2011-07-29 ENCOUNTER — Other Ambulatory Visit: Payer: Self-pay | Admitting: Oncology

## 2011-07-29 DIAGNOSIS — Z1231 Encounter for screening mammogram for malignant neoplasm of breast: Secondary | ICD-10-CM

## 2011-07-29 DIAGNOSIS — Z853 Personal history of malignant neoplasm of breast: Secondary | ICD-10-CM

## 2011-08-05 ENCOUNTER — Ambulatory Visit
Admission: RE | Admit: 2011-08-05 | Discharge: 2011-08-05 | Disposition: A | Discharge status: Home / Self Care | Payer: 59 | Source: Ambulatory Visit | Attending: Oncology | Admitting: Oncology

## 2011-08-05 DIAGNOSIS — Z1231 Encounter for screening mammogram for malignant neoplasm of breast: Secondary | ICD-10-CM

## 2011-08-05 DIAGNOSIS — Z853 Personal history of malignant neoplasm of breast: Secondary | ICD-10-CM

## 2011-08-30 ENCOUNTER — Other Ambulatory Visit: Payer: Self-pay

## 2011-08-30 DIAGNOSIS — C50919 Malignant neoplasm of unspecified site of unspecified female breast: Secondary | ICD-10-CM

## 2011-09-02 ENCOUNTER — Telehealth: Payer: Self-pay | Admitting: Oncology

## 2011-09-02 ENCOUNTER — Ambulatory Visit (HOSPITAL_BASED_OUTPATIENT_CLINIC_OR_DEPARTMENT_OTHER): Payer: 59 | Admitting: Oncology

## 2011-09-02 ENCOUNTER — Encounter: Payer: Self-pay | Admitting: Oncology

## 2011-09-02 ENCOUNTER — Ambulatory Visit (HOSPITAL_BASED_OUTPATIENT_CLINIC_OR_DEPARTMENT_OTHER): Payer: 59 | Admitting: Lab

## 2011-09-02 VITALS — BP 115/75 | HR 66 | Temp 98.4°F | Ht 67.0 in | Wt 198.3 lb

## 2011-09-02 DIAGNOSIS — C50912 Malignant neoplasm of unspecified site of left female breast: Secondary | ICD-10-CM

## 2011-09-02 DIAGNOSIS — C50919 Malignant neoplasm of unspecified site of unspecified female breast: Secondary | ICD-10-CM

## 2011-09-02 DIAGNOSIS — Z853 Personal history of malignant neoplasm of breast: Secondary | ICD-10-CM

## 2011-09-02 DIAGNOSIS — Z1501 Genetic susceptibility to malignant neoplasm of breast: Secondary | ICD-10-CM | POA: Insufficient documentation

## 2011-09-02 DIAGNOSIS — IMO0002 Reserved for concepts with insufficient information to code with codable children: Secondary | ICD-10-CM

## 2011-09-02 LAB — CBC WITH DIFFERENTIAL/PLATELET
Basophils Absolute: 0 10*3/uL (ref 0.0–0.1)
EOS%: 2.1 % (ref 0.0–7.0)
HCT: 35.9 % (ref 34.8–46.6)
HGB: 11.8 g/dL (ref 11.6–15.9)
MCH: 29.3 pg (ref 25.1–34.0)
MCHC: 33 g/dL (ref 31.5–36.0)
MCV: 89 fL (ref 79.5–101.0)
MONO%: 8.7 % (ref 0.0–14.0)
NEUT%: 49.6 % (ref 38.4–76.8)
RDW: 13.9 % (ref 11.2–14.5)

## 2011-09-02 LAB — COMPREHENSIVE METABOLIC PANEL
AST: 17 U/L (ref 0–37)
Alkaline Phosphatase: 64 U/L (ref 39–117)
BUN: 19 mg/dL (ref 6–23)
Creatinine, Ser: 0.94 mg/dL (ref 0.50–1.10)

## 2011-09-02 NOTE — Patient Instructions (Signed)
Will set up breast MRI in August

## 2011-09-02 NOTE — Telephone Encounter (Signed)
gv pt appt schedule for June 2014 mammo and July 2014 lb/LL. S/w Victorino Dike @ [facility] re appt for breast mri 8/15 [facility] out). Victorino Dike has info and order is in [employer]. Per Victorino Dike they will call pt w/appt - pt aware.

## 2011-09-02 NOTE — Progress Notes (Signed)
OFFICE PROGRESS NOTE   09/02/2011   Physicians: K.Janice Norrie (gyn)  INTERVAL HISTORY:  Patient is seen, alone for visit, in yearly follow up of her history of node positive breast cancer in setting of multiple BRCA 1 and 2 variants.  History is of T2 11 node positive left breast cancer diagnosed June 1996 (when she was [redacted] weeks pregnant). Those records precede MOSAIQ, however I recall that she received adriamycin prior to delivery, then had left mastectomy with ~ 23 left axillary nodes evaluated (11 or possibly 13 positive), ER/ PR negative and HER 2 not tested in 1996. After surgery she had additional chemotherapy, including high dose chemotherapy with autologous bone marrow transplant at [facility], and local radiation. She has multiple BRCA 1 and 2 variants. She had hysterectomy with BSO in July 2011, pathology benign. Yearly breast MRIs are medically necessary with BRCA mutations, personal history of breast cancer and breast tissue still "extremely dense" on digital mammograms. Most recent mammogram was at [facility] 08-06-11 and Breast MRI was done last Aug 2012.  Patient did have consultation with plastic surgeon last year and was considering breast reduction on right, but has not done this due to husband's medical problems. She has not wanted to consider prophylactic right mastectomy.  Patient has been doing well other than stress related to husband now out of work on disability. They are changing diet as husband has been recently diagnosed with diabetes, and she hopes this will help her weight loss also. She has had no recent fever or symptoms of infection, no HA, respiratory, cardiac, GI, musculoskeletal or hematologic complaints. Remainder of 10 point Review of Systems negative.  Daughter Joice Lofts is 53, a rising high Education administrator.  Objective:  Vital signs in last 24 hours:  BP 115/75  Pulse 66  Temp 98.4 F (36.9 C) (Oral)  Ht 5\' 7"  (1.702 m)  Wt 198 lb 4.8 oz (89.948 kg)   BMI 31.06 kg/m2 Easily mobile, looks comfortable.   HEENT:PERRLA, sclera clear, anicteric and oropharynx clear, no lesions. Normal hair pattern LymphaticsCervical, supraclavicular, and axillary nodes normal.No inguinal adenopathy Resp: clear to auscultation bilaterally and normal percussion bilaterally Cardio: regular rate and rhythm GI: soft, non-tender; bowel sounds normal; no masses,  no organomegaly Extremities: extremities normal, atraumatic, no cyanosis or edema Neuro:nonfocal Breast:left tram without evidence of local recurrence, nothing in left axilla, no swelling LUE. Right breast without dominant mass, skin or nipple findings and axilla benign. Skin without rash or ecchymoses  Lab Results:  Results for orders placed in visit on 09/02/11  CBC WITH DIFFERENTIAL      Component Value Range   WBC 4.3  3.9 - 10.3 10e3/uL   NEUT# 2.1  1.5 - 6.5 10e3/uL   HGB 11.8  11.6 - 15.9 g/dL   HCT 82.9  56.2 - 13.0 %   Platelets 269  145 - 400 10e3/uL   MCV 89.0  79.5 - 101.0 fL   MCH 29.3  25.1 - 34.0 pg   MCHC 33.0  31.5 - 36.0 g/dL   RBC 8.65  7.84 - 6.96 10e6/uL   RDW 13.9  11.2 - 14.5 %   lymph# 1.7  0.9 - 3.3 10e3/uL   MONO# 0.4  0.1 - 0.9 10e3/uL   Eosinophils Absolute 0.1  0.0 - 0.5 10e3/uL   Basophils Absolute 0.0  0.0 - 0.1 10e3/uL   NEUT% 49.6  38.4 - 76.8 %   LYMPH% 39.2  14.0 - 49.7 %   MONO% 8.7  0.0 - 14.0 %   EOS% 2.1  0.0 - 7.0 %   BASO% 0.4  0.0 - 2.0 %    CMET available after visit entirely normal  Studies/Results:  [facility]  Medications: I have reviewed the patient's current medications.  Assessment/Plan: 1. T2 eleven node + left breast cancer: history as above, now out 17 years from diagnosis and treatment as above, with multiple BRCA abnormalities. Breast MRI in Aug medically necessary. Yearly visits at this office. 2.post hysterectomy and bilateral oophorectomy  Patient is in agreement with plan.   LIVESAY,LENNIS  P, MD   09/02/2011, 11:56 AM

## 2011-09-18 ENCOUNTER — Ambulatory Visit: Payer: 59 | Admitting: Oncology

## 2011-09-18 ENCOUNTER — Other Ambulatory Visit: Payer: 59 | Admitting: Lab

## 2011-10-04 ENCOUNTER — Ambulatory Visit
Admission: RE | Admit: 2011-10-04 | Discharge: 2011-10-04 | Disposition: A | Discharge status: Home / Self Care | Payer: 59 | Source: Ambulatory Visit | Attending: Oncology | Admitting: Oncology

## 2011-10-04 DIAGNOSIS — C50919 Malignant neoplasm of unspecified site of unspecified female breast: Secondary | ICD-10-CM

## 2011-10-04 MED ORDER — GADOBENATE DIMEGLUMINE 529 MG/ML IV SOLN
18.0000 mL | Freq: Once | INTRAVENOUS | Status: AC | PRN
  Administered 2011-10-04: 18 mL via INTRAVENOUS

## 2011-10-08 ENCOUNTER — Telehealth: Payer: Self-pay

## 2011-10-08 NOTE — Telephone Encounter (Signed)
Spoke with Heidi Sexton and told her that her Breast MRI done 10-04-11 was fine per Dr. Darrold Span.

## 2012-04-07 ENCOUNTER — Encounter: Payer: Self-pay | Admitting: Oncology

## 2012-04-07 NOTE — Progress Notes (Signed)
Medical Oncology Documentation  Per genetics counselor, contacted by [facility] re testing done 2004 - 2005, with patient's variant now reclassified to suspected deleterious. K.Powell to contact patient with this information.  Ila Mcgill, MD

## 2012-04-08 ENCOUNTER — Telehealth: Payer: Self-pay | Admitting: Genetic Counselor

## 2012-04-08 NOTE — Telephone Encounter (Signed)
Left message that I received an amended report from her genetic testing performed in 2004 and asked that she CB.

## 2012-04-09 ENCOUNTER — Telehealth: Payer: Self-pay | Admitting: Genetic Counselor

## 2012-04-09 NOTE — Telephone Encounter (Signed)
Left message about updated report on her test from 2004.

## 2012-05-13 ENCOUNTER — Encounter (HOSPITAL_BASED_OUTPATIENT_CLINIC_OR_DEPARTMENT_OTHER): Payer: Self-pay | Admitting: Genetic Counselor

## 2012-05-13 DIAGNOSIS — Z1501 Genetic susceptibility to malignant neoplasm of breast: Secondary | ICD-10-CM

## 2012-07-31 ENCOUNTER — Telehealth: Payer: Self-pay | Admitting: Oncology

## 2012-07-31 NOTE — Telephone Encounter (Signed)
Moved 7/15 appt to covering provider due to LL's departure. S/w pt husband re new time for lb/fu 7/15 @ 8am.

## 2012-08-06 ENCOUNTER — Other Ambulatory Visit: Payer: Self-pay | Admitting: Oncology

## 2012-08-06 ENCOUNTER — Ambulatory Visit
Admission: RE | Admit: 2012-08-06 | Discharge: 2012-08-06 | Disposition: A | Discharge status: Home / Self Care | Payer: 59 | Source: Ambulatory Visit | Attending: Oncology | Admitting: Oncology

## 2012-08-06 DIAGNOSIS — Z1501 Genetic susceptibility to malignant neoplasm of breast: Secondary | ICD-10-CM

## 2012-08-06 DIAGNOSIS — Z1505 Genetic susceptibility to malignant neoplasm of fallopian tube(s): Secondary | ICD-10-CM

## 2012-08-06 DIAGNOSIS — C50919 Malignant neoplasm of unspecified site of unspecified female breast: Secondary | ICD-10-CM

## 2012-08-28 ENCOUNTER — Telehealth: Payer: Self-pay | Admitting: Hematology and Oncology

## 2012-08-28 ENCOUNTER — Telehealth: Payer: Self-pay

## 2012-08-28 NOTE — Telephone Encounter (Signed)
Former LL pt lmonvm inquiring as to whether or not she needs to keep lb/CP on schedule. Message to [facility] desk nurse Sallye Ober) she will take care of this.

## 2012-08-28 NOTE — Telephone Encounter (Signed)
Left message for Heidi Sexton that Dr. Darrold Span wants her followed up yearly in this office.  Told her that if she is wanting to skip a year for appointment is due to Dr. Precious Reel departure from the practice that an appointment can be arranged with Dr. Tami Lin in the [facility] or she can keep appointment for 09-01-12 as scheduled.

## 2012-09-01 ENCOUNTER — Ambulatory Visit (HOSPITAL_BASED_OUTPATIENT_CLINIC_OR_DEPARTMENT_OTHER): Payer: 59 | Admitting: Hematology and Oncology

## 2012-09-01 ENCOUNTER — Other Ambulatory Visit (HOSPITAL_BASED_OUTPATIENT_CLINIC_OR_DEPARTMENT_OTHER): Payer: 59

## 2012-09-01 ENCOUNTER — Other Ambulatory Visit: Payer: Self-pay

## 2012-09-01 ENCOUNTER — Telehealth: Payer: Self-pay | Admitting: Oncology

## 2012-09-01 VITALS — BP 124/87 | HR 79 | Temp 97.0°F | Resp 20 | Ht 67.0 in | Wt 215.2 lb

## 2012-09-01 DIAGNOSIS — Z1501 Genetic susceptibility to malignant neoplasm of breast: Secondary | ICD-10-CM

## 2012-09-01 DIAGNOSIS — C50912 Malignant neoplasm of unspecified site of left female breast: Secondary | ICD-10-CM

## 2012-09-01 DIAGNOSIS — Z1502 Genetic susceptibility to malignant neoplasm of ovary: Secondary | ICD-10-CM

## 2012-09-01 DIAGNOSIS — C50919 Malignant neoplasm of unspecified site of unspecified female breast: Secondary | ICD-10-CM

## 2012-09-01 LAB — COMPREHENSIVE METABOLIC PANEL (CC13)
ALT: 24 U/L (ref 0–55)
Albumin: 3.7 g/dL (ref 3.5–5.0)
CO2: 22 mEq/L (ref 22–29)
Calcium: 9.3 mg/dL (ref 8.4–10.4)
Chloride: 107 mEq/L (ref 98–109)
Glucose: 152 mg/dl — ABNORMAL HIGH (ref 70–140)
Sodium: 140 mEq/L (ref 136–145)
Total Protein: 7.3 g/dL (ref 6.4–8.3)

## 2012-09-01 LAB — CBC WITH DIFFERENTIAL/PLATELET
BASO%: 0.5 % (ref 0.0–2.0)
Eosinophils Absolute: 0.1 10*3/uL (ref 0.0–0.5)
HCT: 36.2 % (ref 34.8–46.6)
LYMPH%: 43.9 % (ref 14.0–49.7)
MCHC: 33.6 g/dL (ref 31.5–36.0)
MONO#: 0.3 10*3/uL (ref 0.1–0.9)
NEUT#: 2.2 10*3/uL (ref 1.5–6.5)
Platelets: 277 10*3/uL (ref 145–400)
RBC: 4.19 10*6/uL (ref 3.70–5.45)
WBC: 4.6 10*3/uL (ref 3.9–10.3)
lymph#: 2 10*3/uL (ref 0.9–3.3)

## 2012-09-01 NOTE — Progress Notes (Signed)
OFFICE PROGRESS NOTE   09/01/2012   Physicians: K.Janice Norrie (gyn)  INTERVAL HISTORY:  Patient is seen, alone for visit, in yearly follow up of her history of node positive breast cancer in setting of multiple BRCA 1 and 2 variants.  History is of T2 11 node positive left breast cancer diagnosed June 1996 (when she was [redacted] weeks pregnant). Those records precede MOSAIQ, however I recall that she received adriamycin prior to delivery, then had left mastectomy with ~ 23 left axillary nodes evaluated (11 or possibly 13 positive), ER/ PR negative and HER 2 not tested in 1996. After surgery she had additional chemotherapy, including high dose chemotherapy with autologous bone marrow transplant at [facility], and local radiation. She has multiple BRCA 1 and 2 variants. She had hysterectomy with BSO in July 2011, pathology benign. Yearly breast MRIs are medically necessary with BRCA mutations, personal history of breast cancer and breast tissue still "extremely dense" on digital mammograms. Most recent mammogram was at [facility] 08-06-11 and Breast MRI was done last Aug 2012.  Patient did have consultation with plastic surgeon last year and was considering breast reduction on right, but has not done this due to husband's medical problems. She has not wanted to consider prophylactic right mastectomy.  Patient has been doing well other than stress related to husband now out of work on disability. She has had no recent fever or symptoms of infection, no HA, respiratory, cardiac, GI, musculoskeletal or hematologic complaints. Remainder of 10 point Review of Systems negative.  Daughter Joice Lofts is 34, a rising high Education administrator.  Objective:  Vital signs in last 24 hours:  BP 124/87  Pulse 79  Temp(Src) 97 F (36.1 C) (Oral)  Resp 20  Ht 5\' 7"  (1.702 m)  Wt 215 lb 3.2 oz (97.614 kg)  BMI 33.7 kg/m2 Easily mobile, looks comfortable.   HEENT:PERRLA, sclera clear, anicteric and oropharynx clear, no  lesions. Normal hair pattern LymphaticsCervical, supraclavicular, and axillary nodes normal.No inguinal adenopathy Resp: clear to auscultation bilaterally and normal percussion bilaterally Cardio: regular rate and rhythm GI: soft, non-tender; bowel sounds normal; no masses,  no organomegaly Extremities: extremities normal, atraumatic, no cyanosis or edema Neuro:nonfocal Breast:left tram without evidence of local recurrence, nothing in left axilla, no swelling LUE. Right breast without dominant mass, skin or nipple findings and axilla benign. Skin without rash or ecchymoses  Lab Results:  Results for orders placed in visit on 09/01/12  CBC WITH DIFFERENTIAL      Result Value Range   WBC 4.6  3.9 - 10.3 10e3/uL   NEUT# 2.2  1.5 - 6.5 10e3/uL   HGB 12.2  11.6 - 15.9 g/dL   HCT 40.9  81.1 - 91.4 %   Platelets 277  145 - 400 10e3/uL   MCV 86.4  79.5 - 101.0 fL   MCH 29.0  25.1 - 34.0 pg   MCHC 33.6  31.5 - 36.0 g/dL   RBC 7.82  9.56 - 2.13 10e6/uL   RDW 14.0  11.2 - 14.5 %   lymph# 2.0  0.9 - 3.3 10e3/uL   MONO# 0.3  0.1 - 0.9 10e3/uL   Eosinophils Absolute 0.1  0.0 - 0.5 10e3/uL   Basophils Absolute 0.0  0.0 - 0.1 10e3/uL   NEUT% 46.6  38.4 - 76.8 %   LYMPH% 43.9  14.0 - 49.7 %   MONO% 5.9  0.0 - 14.0 %   EOS% 3.1  0.0 - 7.0 %   BASO% 0.5  0.0 - 2.0 %    CMET available after visit entirely normal  Studies/Results:  [facility] as [facility]  Medications: I have reviewed the patient's current medications.  Assessment/Plan: 1. T2 eleven node + left breast cancer: history as above, now out 17 years from diagnosis and treatment as above, with multiple BRCA abnormalities. Breast MRI in Aug medically necessary. Yearly visits at this office. 2.post hysterectomy and bilateral oophorectomy  Patient is in agreement with plan.   Zachery Dakins, MD   09/01/2012, 9:50 AM

## 2012-09-01 NOTE — Telephone Encounter (Signed)
gv and printed appt sched and avs  °

## 2012-09-02 ENCOUNTER — Other Ambulatory Visit: Payer: Self-pay | Admitting: *Deleted

## 2012-09-02 DIAGNOSIS — C50919 Malignant neoplasm of unspecified site of unspecified female breast: Secondary | ICD-10-CM

## 2012-09-02 NOTE — Progress Notes (Signed)
Sheralyn Boatman with Radiology calling to clarify orders for MRI, need to be changed to w & w/o contrast and correct attending Oncologist.

## 2012-09-24 ENCOUNTER — Other Ambulatory Visit: Payer: 59

## 2012-09-25 ENCOUNTER — Ambulatory Visit
Admission: RE | Admit: 2012-09-25 | Discharge: 2012-09-25 | Disposition: A | Discharge status: Home / Self Care | Payer: 59 | Source: Ambulatory Visit | Attending: Oncology | Admitting: Oncology

## 2012-09-25 DIAGNOSIS — C50912 Malignant neoplasm of unspecified site of left female breast: Secondary | ICD-10-CM

## 2012-09-25 DIAGNOSIS — Z1501 Genetic susceptibility to malignant neoplasm of breast: Secondary | ICD-10-CM

## 2012-09-25 MED ORDER — GADOBENATE DIMEGLUMINE 529 MG/ML IV SOLN
18.0000 mL | Freq: Once | INTRAVENOUS | Status: AC | PRN
  Administered 2012-09-25: 18 mL via INTRAVENOUS

## 2013-09-13 ENCOUNTER — Other Ambulatory Visit: Payer: Self-pay

## 2013-09-13 DIAGNOSIS — Z1509 Genetic susceptibility to other malignant neoplasm: Secondary | ICD-10-CM

## 2013-09-13 DIAGNOSIS — Z1501 Genetic susceptibility to malignant neoplasm of breast: Secondary | ICD-10-CM

## 2013-09-13 DIAGNOSIS — C50912 Malignant neoplasm of unspecified site of left female breast: Secondary | ICD-10-CM

## 2013-09-13 NOTE — Progress Notes (Signed)
Ms. Heidi Sexton wants to have mamlgram and breast MRI set up for this year in August and see Dr. Marko Sexton in follow up since she is back at [facility].  Cancell Dr. Virgie Dad appointment. L/M in work voice mail and cell mail full that mammogram set up for 09-23-13 at 1620 and Breast MRI is set up for 09-24-13 at 1715.  She can call the [facility] or [facility] to reschedule these appointments.   She wil,l be called with an appointment to follow  Up with Dr. Marko Sexton.

## 2013-09-15 ENCOUNTER — Telehealth: Payer: Self-pay

## 2013-09-15 DIAGNOSIS — C50912 Malignant neoplasm of unspecified site of left female breast: Secondary | ICD-10-CM

## 2013-09-15 NOTE — Telephone Encounter (Signed)
Heidi Sexton states that the breast MRI and Mammogram appoints are fine as scheduled.   Scheduled her to see Dr. Marko Plume August 26 at 1500.  Cancelled appointments with Dr. Jana Hakim per patient request.

## 2013-09-18 HISTORY — PX: BREAST BIOPSY: SHX20

## 2013-09-23 ENCOUNTER — Ambulatory Visit
Admission: RE | Admit: 2013-09-23 | Discharge: 2013-09-23 | Disposition: A | Discharge status: Home / Self Care | Payer: 59 | Source: Ambulatory Visit | Attending: Oncology | Admitting: Oncology

## 2013-09-23 DIAGNOSIS — Z1509 Genetic susceptibility to other malignant neoplasm: Secondary | ICD-10-CM

## 2013-09-23 DIAGNOSIS — C50912 Malignant neoplasm of unspecified site of left female breast: Secondary | ICD-10-CM

## 2013-09-23 DIAGNOSIS — Z1501 Genetic susceptibility to malignant neoplasm of breast: Secondary | ICD-10-CM

## 2013-09-24 ENCOUNTER — Ambulatory Visit
Admission: RE | Admit: 2013-09-24 | Discharge: 2013-09-24 | Disposition: A | Discharge status: Home / Self Care | Payer: 59 | Source: Ambulatory Visit | Attending: Hematology and Oncology | Admitting: Hematology and Oncology

## 2013-09-24 DIAGNOSIS — C50919 Malignant neoplasm of unspecified site of unspecified female breast: Secondary | ICD-10-CM

## 2013-09-24 MED ORDER — GADOBENATE DIMEGLUMINE 529 MG/ML IV SOLN
20.0000 mL | Freq: Once | INTRAVENOUS | Status: AC | PRN
Start: 2013-09-24 — End: 2013-09-24

## 2013-09-27 ENCOUNTER — Other Ambulatory Visit: Payer: Self-pay | Admitting: Oncology

## 2013-09-27 DIAGNOSIS — R928 Other abnormal and inconclusive findings on diagnostic imaging of breast: Secondary | ICD-10-CM

## 2013-09-28 ENCOUNTER — Other Ambulatory Visit: Payer: Self-pay | Admitting: Oncology

## 2013-09-28 DIAGNOSIS — R928 Other abnormal and inconclusive findings on diagnostic imaging of breast: Secondary | ICD-10-CM

## 2013-09-30 ENCOUNTER — Other Ambulatory Visit: Payer: Self-pay | Admitting: Oncology

## 2013-09-30 ENCOUNTER — Other Ambulatory Visit: Payer: 59

## 2013-09-30 ENCOUNTER — Ambulatory Visit
Admission: RE | Admit: 2013-09-30 | Discharge: 2013-09-30 | Disposition: A | Discharge status: Home / Self Care | Payer: 59 | Source: Ambulatory Visit | Attending: Oncology | Admitting: Oncology

## 2013-09-30 DIAGNOSIS — R928 Other abnormal and inconclusive findings on diagnostic imaging of breast: Secondary | ICD-10-CM

## 2013-10-01 ENCOUNTER — Telehealth: Payer: Self-pay | Admitting: Oncology

## 2013-10-01 NOTE — Telephone Encounter (Signed)
Medical Oncology  Tried to return call to patient. Could not LM on cell, did LM on home # that I am glad to speak with her and that I will be back in office on 8-17.  Godfrey Pick, MD

## 2013-10-07 ENCOUNTER — Ambulatory Visit: Payer: 59 | Admitting: Oncology

## 2013-10-10 ENCOUNTER — Other Ambulatory Visit: Payer: Self-pay | Admitting: Oncology

## 2013-10-10 DIAGNOSIS — Z1509 Genetic susceptibility to other malignant neoplasm: Secondary | ICD-10-CM

## 2013-10-10 DIAGNOSIS — C50911 Malignant neoplasm of unspecified site of right female breast: Secondary | ICD-10-CM

## 2013-10-10 DIAGNOSIS — Z1501 Genetic susceptibility to malignant neoplasm of breast: Secondary | ICD-10-CM

## 2013-10-10 DIAGNOSIS — Z15060 Genetic susceptibility to colorectal cancer: Secondary | ICD-10-CM

## 2013-10-11 ENCOUNTER — Ambulatory Visit (INDEPENDENT_AMBULATORY_CARE_PROVIDER_SITE_OTHER): Payer: 59 | Admitting: General Surgery

## 2013-10-11 ENCOUNTER — Encounter (INDEPENDENT_AMBULATORY_CARE_PROVIDER_SITE_OTHER): Payer: Self-pay | Admitting: General Surgery

## 2013-10-11 VITALS — BP 150/80 | HR 88 | Temp 98.2°F | Ht 67.5 in | Wt 214.2 lb

## 2013-10-11 DIAGNOSIS — C50911 Malignant neoplasm of unspecified site of right female breast: Secondary | ICD-10-CM

## 2013-10-11 DIAGNOSIS — C50419 Malignant neoplasm of upper-outer quadrant of unspecified female breast: Secondary | ICD-10-CM

## 2013-10-11 DIAGNOSIS — C50411 Malignant neoplasm of upper-outer quadrant of right female breast: Secondary | ICD-10-CM | POA: Insufficient documentation

## 2013-10-11 DIAGNOSIS — C50919 Malignant neoplasm of unspecified site of unspecified female breast: Secondary | ICD-10-CM

## 2013-10-11 NOTE — Patient Instructions (Signed)
We will refer you for plastic surgery evaluation and radiation oncology   I will see you back in 2-3 weeks.

## 2013-10-11 NOTE — Progress Notes (Signed)
Chief Complaint  Patient presents with  . New Evaluation    Breast Cancer    Referring MD: Marko Plume  HISTORY: Pt is a 53 yo F with history of a T2N2 left breast cancer while pregnant in 1996.  She had chemotherapy for hormone receptor negative breast cancer (her2 not tested) as well as left modified radical mastectomy with TRAM reconstruction. She has BRCA 1 and 2 variant. She has been doing well since then with yearly MRIs until this year.  She was found to have a 6 mm nodule 17 cm from the nipple at 10 o'clock.  This was grade 3 triple negative cancer with Ki67 87%.  She is sore at the biopsy site. She is not happy about getting cancer.  Dr. Rebekah Chesterfield and Dr. Stephanie Coup performed her surgery in 1996.    Past Medical History  Diagnosis Date  . Cancer     Past Surgical History  Procedure Laterality Date  . Breast surgery    . Abdominal hysterectomy      Current Outpatient Prescriptions  Medication Sig Dispense Refill  . Cholecalciferol (VITAMIN D) 2000 UNITS tablet Take 2,000 Units by mouth daily.      Marland Kitchen loratadine (CLARITIN) 10 MG tablet Take 10 mg by mouth daily as needed.       No current facility-administered medications for this visit.     Allergies  Allergen Reactions  . Adhesive [Tape] Hives  . Aleve [Naproxen] Itching     Family History  Problem Relation Age of Onset  . Liver cancer Mother      History   Social History  . Marital Status: Married    Spouse Name: N/A    Number of Children: N/A  . Years of Education: N/A   Social History Main Topics  . Smoking status: Former Research scientist (life sciences)  . Smokeless tobacco: Never Used  . Alcohol Use: No  . Drug Use: No  . Sexual Activity: None   Other Topics Concern  . None   Social History Narrative  . None     REVIEW OF SYSTEMS - PERTINENT POSITIVES ONLY: 12 point review of systems negative other than HPI and PMH  EXAM: Filed Vitals:   10/11/13 1423  BP: 150/80  Pulse: 88  Temp: 98.2 F (36.8 C)    Wt Readings  from Last 3 Encounters:  10/11/13 214 lb 4 oz (97.183 kg)  09/01/12 215 lb 3.2 oz (97.614 kg)  09/02/11 198 lb 4.8 oz (89.948 kg)     Gen:  No acute distress.  Well nourished and well groomed.   Neurological: Alert and oriented to person, place, and time. Coordination normal.  Head: Normocephalic and atraumatic.  Eyes: Conjunctivae are normal. Pupils are equal, round, and reactive to light. No scleral icterus.  Neck: Normal range of motion. Neck supple. No tracheal deviation or thyromegaly present.  Cardiovascular: Normal rate, regular rhythm, normal heart sounds and intact distal pulses.  Exam reveals no gallop and no friction rub.  No murmur heard. Breast: no palpable masses.  Small hematoma at biopsy site.  No palpable lymphadenopathy.  Left breast surgically resected.  No right nipple retraction.   Respiratory: Effort normal.  No respiratory distress. No chest wall tenderness. Breath sounds normal.  No wheezes, rales or rhonchi.  GI: Soft. Bowel sounds are normal. The abdomen is soft and nontender.  There is no rebound and no guarding.  Musculoskeletal: Normal range of motion. Extremities are nontender.  Lymphadenopathy: No cervical, preauricular, postauricular or axillary adenopathy is  present Skin: Skin is warm and dry. No rash noted. No diaphoresis. No erythema. No pallor. No clubbing, cyanosis, or edema.   Psychiatric: Normal mood and affect. Behavior is normal. Judgment and thought content normal.    LABORATORY RESULTS: Available labs are reviewed   Pathology Diagnosis Breast, right, needle core biopsy, 10 o'clock, RUOQ - INVASIVE MAMMARY CARCINOMA, SEE COMMENT.  RADIOLOGY RESULTS: See E-Chart or I-Site for most recent results.  Images and reports are reviewed.  Mr Breast Bilateral W Wo Contrast  09/27/2013   CLINICAL DATA:  Patient with remote history of left mastectomy status post left myocutaneous flap reconstruction.  EXAM: BILATERAL BREAST MRI WITH AND WITHOUT CONTRAST   TECHNIQUE: Multiplanar, multisequence MR images of both breasts were obtained prior to and following the intravenous administration of 2m of MultiHance.  THREE-DIMENSIONAL MR IMAGE RENDERING ON INDEPENDENT WORKSTATION:  Three-dimensional MR images were rendered by post-processing of the original MR data on an independent workstation. The three-dimensional MR images were interpreted, and findings are reported in the following complete MRI report for this study. Three dimensional images were evaluated at the independent DynaCad workstation  COMPARISON:  Prior exams including prior MRI 10/05/2010; 10/04/2011 and 09/25/2012.  FINDINGS: Breast composition: c:  Heterogeneous fibroglandular tissue  Background parenchymal enhancement: Mild  Right breast: Within the upper-outer aspect of the right breast posterior depth is a new circumscribed enhancing 9 x 5 x 6 mm mass. This demonstrates associated increased T2 signal. No additional concerning areas of enhancement are identified within the right breast.  Left breast: Normal appearing myocutaneous flap reconstruction.  Lymph nodes: No abnormal appearing lymph nodes.  Ancillary findings:  None.  IMPRESSION: New 9 mm enhancing mass within the upper-outer right breast posteriorly with associated T2 signal. This may potentially represent a new intramammary lymph node. Additionally this is felt to correspond with the new mass identified on recent screening mammogram for which the patient has been recalled. Recommend continued diagnostic mammographic evaluation and second-look right breast ultrasound and possible biopsy.  RECOMMENDATION: Second look ultrasound for new mass within the upper-outer right breast posteriorly on MRI and mammography. If the mass demonstrates concerning features on ultrasound, consider ultrasound-guided core needle biopsy. If the mass is sonographically occult, recommend further evaluation with MRI guided core needle biopsy.  BI-RADS CATEGORY  4:  Suspicious.   Electronically Signed   By: DLovey NewcomerM.D.   On: 09/27/2013 10:23   Mm Digital Diagnostic Unilat R  10/04/2013   CLINICAL DATA:  Post right breast ultrasound-guided core biopsy.  EXAM: DIAGNOSTIC right breast MAMMOGRAM POST ULTRASOUND BIOPSY  COMPARISON:  Previous exams  FINDINGS: Mammographic images were obtained following ultrasound guided biopsy of the mass located within the right breast at the 10 o'clock position. The ribbon shaped clip is in appropriate position.  IMPRESSION: Appropriate position of clip following right breast ultrasound-guided core biopsy  Final Assessment: Post Procedure Mammograms for Marker Placement   Electronically Signed   By: RLuberta RobertsonM.D.   On: 10/04/2013 10:03   Mm Digital Diagnostic Unilat R  09/30/2013   CLINICAL DATA:  The patient has a history of a previous malignant mastectomy of the left breast for a T2 carcinoma of the left breast in 1996 ( she was [redacted] weeks pregnant at the time). Left axillary lymph node dissection was performed as well with 11 positive left axillary lymph nodes. The patient received chemotherapy. She also gives a history of multiple BRCA 1 and BRCA 2 mutations. She has undergone previous hysterectomy  and bilateral oophorectomy. She has been followed by screening mammography and breast MRI. Recent right breast screening mammogram and breast MRI demonstrated a new oval mass within the upper-outer quadrant of the right breast. Diagnostic evaluation was recommended.  EXAM: DIGITAL DIAGNOSTIC  right breast MAMMOGRAM WITH CAD  ULTRASOUND right BREAST  COMPARISON:  Previous examinations the most recent of which is dated 09/23/2013.  ACR Breast Density Category c: The breast tissue is heterogeneously dense, which may obscure small masses.  FINDINGS: There is an oval circumscribed mass located within the upper outer quadrant of the right breast with lucency along its superior border. Most likely this represents an intramammary lymph node  which has increased in size.  Mammographic images were processed with CAD.  On physical exam, there is no discrete palpable abnormality within the upper outer quadrant of the right breast  Ultrasound is performed, showing there is an oval mass with a central cleft located within the right breast at 10 o'clock position 17 cm from the nipple corresponding to the mammographic finding. Most likely this represents an intramammary lymph node. However, there is eccentric cortical thickening associated with this lymph node and this does represent change when compare with prior studies. This measures 6 x 5 x 4 mm in size. Tissue sampling is recommended. I discussed ultrasound-guided core biopsy with clip placement of this mass with the patient. This will be performed and reported separately.  IMPRESSION: 6 mm suspicious mass located within the upper outer quadrant of the right breast at the 10 o'clock position 17 cm from the nipple. Most likely this represents an abnormal intramammary lymph node. Tissue sampling is recommended and ultrasound-guided core biopsy will be performed and reported separately.  RECOMMENDATION: Right breast ultrasound-guided core biopsy.  I have discussed the findings and recommendations with the patient. Results were also provided in writing at the conclusion of the visit. If applicable, a reminder letter will be sent to the patient regarding the next appointment.  BI-RADS CATEGORY  4: Suspicious.   Electronically Signed   By: Luberta Robertson M.D.   On: 09/30/2013 16:29   US Breast Ltd Uni Right Inc Axilla  09/30/2013   CLINICAL DATA:  The patient has a history of a previous malignant mastectomy of the left breast for a T2 carcinoma of the left breast in 1996 ( she was [redacted] weeks pregnant at the time). Left axillary lymph node dissection was performed as well with 11 positive left axillary lymph nodes. The patient received chemotherapy. She also gives a history of multiple BRCA 1 and BRCA 2  mutations. She has undergone previous hysterectomy and bilateral oophorectomy. She has been followed by screening mammography and breast MRI. Recent right breast screening mammogram and breast MRI demonstrated a new oval mass within the upper-outer quadrant of the right breast. Diagnostic evaluation was recommended.  EXAM: DIGITAL DIAGNOSTIC  right breast MAMMOGRAM WITH CAD  ULTRASOUND right BREAST  COMPARISON:  Previous examinations the most recent of which is dated 09/23/2013.  ACR Breast Density Category c: The breast tissue is heterogeneously dense, which may obscure small masses.  FINDINGS: There is an oval circumscribed mass located within the upper outer quadrant of the right breast with lucency along its superior border. Most likely this represents an intramammary lymph node which has increased in size.  Mammographic images were processed with CAD.  On physical exam, there is no discrete palpable abnormality within the upper outer quadrant of the right breast  Ultrasound is performed, showing there is an  oval mass with a central cleft located within the right breast at 10 o'clock position 17 cm from the nipple corresponding to the mammographic finding. Most likely this represents an intramammary lymph node. However, there is eccentric cortical thickening associated with this lymph node and this does represent change when compare with prior studies. This measures 6 x 5 x 4 mm in size. Tissue sampling is recommended. I discussed ultrasound-guided core biopsy with clip placement of this mass with the patient. This will be performed and reported separately.  IMPRESSION: 6 mm suspicious mass located within the upper outer quadrant of the right breast at the 10 o'clock position 17 cm from the nipple. Most likely this represents an abnormal intramammary lymph node. Tissue sampling is recommended and ultrasound-guided core biopsy will be performed and reported separately.  RECOMMENDATION: Right breast  ultrasound-guided core biopsy.  I have discussed the findings and recommendations with the patient. Results were also provided in writing at the conclusion of the visit. If applicable, a reminder letter will be sent to the patient regarding the next appointment.  BI-RADS CATEGORY  4: Suspicious.   Electronically Signed   By: Luberta Robertson M.D.   On: 09/30/2013 16:29   Mm Screening Breast Tomo Uni R  09/24/2013   CLINICAL DATA:  Screening.  EXAM: DIGITAL SCREENING UNILATERAL RIGHT MAMMOGRAM WITH TOMO AND CAD  COMPARISON:  Previous exam(s)  ACR Breast Density Category c: The breast tissue is heterogeneously dense, which may obscure small masses.  FINDINGS: In the right breast, a possible mass warrants further evaluation with spot compression views and possibly ultrasound. In the left breast, no findings suspicious for malignancy. Images were processed with CAD.  IMPRESSION: Further evaluation is suggested for possible mass in the right breast.  RECOMMENDATION: Diagnostic mammogram and possibly ultrasound of the right breast. (Code:[id number])  The patient will be contacted regarding the findings, and additional imaging will be scheduled.  BI-RADS CATEGORY  0: Incomplete. Need additional imaging evaluation and/or prior mammograms for comparison.   Electronically Signed   By: Lovey Newcomer M.D.   On: 09/24/2013 15:49   [place] Rt Breast Bx W Loc Dev 1st Lesion Img Bx Spec US Guide  10/04/2013   ADDENDUM REPORT: 10/01/2013 12:15  ADDENDUM: Pathology revealed grade 3 invasive mammary carcinoma in the right breast. This was found to be concordant by Dr. Curlene Dolphin. Pathology was discussed with the patient by telephone. She reported doing well after the biopsy. Post biopsy instructions were reviewed and her questions were answered. The patient has a history of left breast cancer diagnosed in 1996, while she was [redacted] weeks pregnant. She underwent chemotherapy, mastectomy, and axillary dissection (11 positive lymph nodes). She  has known BRCA 1 and 2 mutations and has had a hysterectomy and oophorectomy. Surgical referral has been scheduled with Dr. [name] at [facility] on October 11, 2013. She also has an appointment with her medical oncologist, Dr. Evlyn Clines, on October 13, 2013. She has already had an MRI. My number was provided to the patient for future questions and concerns.  Pathology results reported by Susa Raring RN, BSN on October 01, 2013.   Electronically Signed   By: Luberta Robertson M.D.   On: 10/01/2013 12:15   10/04/2013   CLINICAL DATA:  History of malignant mastectomy of the left breast in 1996 for a T2 tumor. At that time 11 positive lymph nodes were present on left axillary lymph node dissection. She received chemotherapy. The patient was  [redacted] weeks pregnant at the time of her diagnosis. The patient has a history of multiple BRCA 1 and BRCA 2 mutations. Breast MRI and recent right breast mammogram demonstrated a new small mass within the upper outer quadrant of the right breast.  EXAM: ULTRASOUND GUIDED right BREAST CORE NEEDLE BIOPSY  COMPARISON:  Previous exams.  PROCEDURE: I met with the patient and we discussed the procedure of ultrasound-guided biopsy, including benefits and alternatives. We discussed the high likelihood of a successful procedure. We discussed the risks of the procedure including infection, bleeding, tissue injury, clip migration, and inadequate sampling. Informed written consent was given. The usual time-out protocol was performed immediately prior to the procedure.  Using sterile technique and 2% Lidocaine as local anesthetic, under direct ultrasound visualization, a 12 gauge vacuum assisteddevice was used to perform biopsy of the small mass located within the right breast at the 10 o'clock position 17 cm from the nipple using a lateral approach. At the conclusion of the procedure, a ribbon shaped tissue marker clip was deployed into the biopsy cavity. Follow-up  2-view mammogram was performed and dictated separately.  IMPRESSION: Ultrasound-guided biopsy of the right breast mass located at the 10 o'clock position 17 cm from the nipple as discussed above. No apparent complications.  Electronically Signed: By: Luberta Robertson M.D. On: 09/30/2013 16:59      ASSESSMENT AND PLAN: Breast cancer of upper-outer quadrant of right female breast Pt is going to think about what she would like to do.    She originally wanted a lumpectomy and sentinel lymph node biopsy, but we discussed that she is likely to have another event as she is only 24 and has BRCA variants. She has had bilateral breast cancer by the age of 59.    The complicating factor is that the lesion on MRI may a intramammary lymph node replaced by tumor rather than a de novo cancer.  I would like to see what radiation oncology recommends doing with this in terms of their plan for radiation.  It may be that she can proceed with mastectomy and sentinel lymph node biopsy with immediate reconstruction since the mass is 1 cm, and all imaging shows normal lymph nodes.    She will also discuss with plastic surgery what options she has for reconstruction either with a lumpectomy and reduction/mammoplasty vs a mastectomy and how radiation affects her current cancer and potential future cancer if she continues to strongly desire breast conservation.  45 min spent in evaluation, examination, counseling, and coordination of care.  >50% of time spent in counseling.        Milus Height MD Surgical [facility]      Visit Diagnoses: 1. Breast cancer, right   2. Breast cancer of upper-outer quadrant of right female breast     Primary Care Physician: Wenda Low, MD  Other care  Marko Plume, MD Harlow Mares, MD

## 2013-10-11 NOTE — Assessment & Plan Note (Signed)
Pt is going to think about what she would like to do.    She originally wanted a lumpectomy and sentinel lymph node biopsy, but we discussed that she is likely to have another event as she is only 89 and has BRCA variants. She has had bilateral breast cancer by the age of 62.    The complicating factor is that the lesion on MRI may a intramammary lymph node replaced by tumor rather than a de novo cancer.  I would like to see what radiation oncology recommends doing with this in terms of their plan for radiation.  It may be that she can proceed with mastectomy and sentinel lymph node biopsy with immediate reconstruction since the mass is 1 cm, and all imaging shows normal lymph nodes.    She will also discuss with plastic surgery what options she has for reconstruction either with a lumpectomy and reduction/mammoplasty vs a mastectomy and how radiation affects her current cancer and potential future cancer if she continues to strongly desire breast conservation.  45 min spent in evaluation, examination, counseling, and coordination of care.  >50% of time spent in counseling.

## 2013-10-12 ENCOUNTER — Telehealth: Payer: Self-pay | Admitting: Oncology

## 2013-10-12 ENCOUNTER — Ambulatory Visit (HOSPITAL_COMMUNITY)
Admission: RE | Admit: 2013-10-12 | Discharge: 2013-10-12 | Disposition: A | Discharge status: Home / Self Care | Payer: 59 | Source: Ambulatory Visit | Attending: Oncology | Admitting: Oncology

## 2013-10-12 ENCOUNTER — Ambulatory Visit (HOSPITAL_COMMUNITY): Payer: 59

## 2013-10-12 DIAGNOSIS — Z1501 Genetic susceptibility to malignant neoplasm of breast: Secondary | ICD-10-CM | POA: Diagnosis present

## 2013-10-12 DIAGNOSIS — C50919 Malignant neoplasm of unspecified site of unspecified female breast: Secondary | ICD-10-CM | POA: Insufficient documentation

## 2013-10-12 DIAGNOSIS — Z1509 Genetic susceptibility to other malignant neoplasm: Secondary | ICD-10-CM

## 2013-10-12 DIAGNOSIS — Z1502 Genetic susceptibility to malignant neoplasm of ovary: Secondary | ICD-10-CM | POA: Diagnosis present

## 2013-10-12 DIAGNOSIS — C50911 Malignant neoplasm of unspecified site of right female breast: Secondary | ICD-10-CM

## 2013-10-12 MED ORDER — IOHEXOL 300 MG/ML  SOLN
50.0000 mL | Freq: Once | INTRAMUSCULAR | Status: AC | PRN
  Administered 2013-10-12: 50 mL via ORAL

## 2013-10-12 MED ORDER — IOHEXOL 300 MG/ML  SOLN
100.0000 mL | Freq: Once | INTRAMUSCULAR | Status: AC | PRN
  Administered 2013-10-12: 100 mL via INTRAVENOUS

## 2013-10-12 NOTE — Telephone Encounter (Signed)
pt contacted by central rad sched re ct appt - no other orders per 8/23 pof.

## 2013-10-13 ENCOUNTER — Other Ambulatory Visit (HOSPITAL_BASED_OUTPATIENT_CLINIC_OR_DEPARTMENT_OTHER): Payer: 59

## 2013-10-13 ENCOUNTER — Encounter: Payer: Self-pay | Admitting: Oncology

## 2013-10-13 ENCOUNTER — Ambulatory Visit (HOSPITAL_BASED_OUTPATIENT_CLINIC_OR_DEPARTMENT_OTHER): Payer: 59 | Admitting: Oncology

## 2013-10-13 VITALS — BP 128/89 | HR 75 | Temp 98.3°F | Resp 18 | Ht 67.0 in | Wt 213.4 lb

## 2013-10-13 DIAGNOSIS — Z1509 Genetic susceptibility to other malignant neoplasm: Secondary | ICD-10-CM

## 2013-10-13 DIAGNOSIS — C50419 Malignant neoplasm of upper-outer quadrant of unspecified female breast: Secondary | ICD-10-CM

## 2013-10-13 DIAGNOSIS — C50912 Malignant neoplasm of unspecified site of left female breast: Secondary | ICD-10-CM

## 2013-10-13 DIAGNOSIS — C50911 Malignant neoplasm of unspecified site of right female breast: Secondary | ICD-10-CM

## 2013-10-13 DIAGNOSIS — Z1501 Genetic susceptibility to malignant neoplasm of breast: Secondary | ICD-10-CM

## 2013-10-13 DIAGNOSIS — Z171 Estrogen receptor negative status [ER-]: Secondary | ICD-10-CM

## 2013-10-13 DIAGNOSIS — Z853 Personal history of malignant neoplasm of breast: Secondary | ICD-10-CM

## 2013-10-13 LAB — CBC WITH DIFFERENTIAL/PLATELET
BASO%: 0.3 % (ref 0.0–2.0)
Basophils Absolute: 0 10*3/uL (ref 0.0–0.1)
EOS%: 1.7 % (ref 0.0–7.0)
Eosinophils Absolute: 0.1 10*3/uL (ref 0.0–0.5)
HCT: 37.7 % (ref 34.8–46.6)
HGB: 11.9 g/dL (ref 11.6–15.9)
LYMPH%: 29.1 % (ref 14.0–49.7)
MCH: 27.8 pg (ref 25.1–34.0)
MCHC: 31.6 g/dL (ref 31.5–36.0)
MCV: 88.1 fL (ref 79.5–101.0)
MONO#: 0.4 10*3/uL (ref 0.1–0.9)
MONO%: 7.7 % (ref 0.0–14.0)
NEUT#: 3.4 10*3/uL (ref 1.5–6.5)
NEUT%: 61.2 % (ref 38.4–76.8)
PLATELETS: 318 10*3/uL (ref 145–400)
RBC: 4.28 10*6/uL (ref 3.70–5.45)
RDW: 14.1 % (ref 11.2–14.5)
WBC: 5.5 10*3/uL (ref 3.9–10.3)
lymph#: 1.6 10*3/uL (ref 0.9–3.3)

## 2013-10-13 LAB — COMPREHENSIVE METABOLIC PANEL (CC13)
ALK PHOS: 74 U/L (ref 40–150)
ALT: 18 U/L (ref 0–55)
AST: 17 U/L (ref 5–34)
Albumin: 4 g/dL (ref 3.5–5.0)
Anion Gap: 11 mEq/L (ref 3–11)
BILIRUBIN TOTAL: 0.65 mg/dL (ref 0.20–1.20)
BUN: 15.7 mg/dL (ref 7.0–26.0)
CO2: 24 mEq/L (ref 22–29)
Calcium: 9.4 mg/dL (ref 8.4–10.4)
Chloride: 107 mEq/L (ref 98–109)
Creatinine: 0.8 mg/dL (ref 0.6–1.1)
Glucose: 87 mg/dl (ref 70–140)
Potassium: 4 mEq/L (ref 3.5–5.1)
SODIUM: 142 meq/L (ref 136–145)
TOTAL PROTEIN: 7.7 g/dL (ref 6.4–8.3)

## 2013-10-13 NOTE — Progress Notes (Signed)
OFFICE PROGRESS NOTE   10/13/2013   Physicians: F.Byerly, K.Winifred Olive (gyn), Crissie Reese (new patient apt 10-18-13), Eppie Gibson (new patient apt 10-20-13).  INTERVAL HISTORY:  Patient is seen,together with husband and for first time back by this MD since 08-2011, now with new diagnosis of second primary breast cancer in right breast. At last genetics testing, she had multiple BRCA 1 and 2 variants.    I have known her since first breast cancer on left in June 1996, when she was [redacted] weeks pregnant. This was ER PR negative, HER 2 not tested in 1996.  She was treated with adriamycin as she completed the pregnancy (daughter [name] now 4 years old and healthy), had mastectomy with 11 or 13 nodes positive out of 23 nodes, additional chemotherapy including high dose with autologous transplant at [facility], and local radiation on left. She has had no known active disease since treatment was completed in 1997. Note these records preceded all EMRs; [facility] has gotten some information from microfilm so far, with request also submitted to [facility] for their chemotherapy information.  She had hysterectomy with oophorectomy ~ 08-2009.  She has not wanted to consider prophylactic right mastectomy in past, despite history as above. She has been followed with mammograms and yearly breast MRIs due to BRCA abnormalities. She had TRAM reconstruction on left, and has been unhappy with asymmetrical size between the reconstruction and remaining breast.  Patient was seen by locum tenens at this office 09-01-12. She was not aware of any changes in right breast. She had right tomo mammogram at [facility] on 09-24-13, with heterogeneously dense tissue and suspicious area noted. She had MRI  09-27-13 (already scheduled) with 9 x 5 x 6 mm mass upper outer quadrant on right, with no other findings in right breast, no right axillary or internal mammary adenopathy and nothing of concern with reconstructed left  breast/axilla. She had US biopsy at [facility] on 10-04-13, pathology [phone]) with invasive mammary carcinoma ER PR and HER 2 negative and proliferation fraction 87%., She was seen by Dr Barry Dienes on 10-11-13, with recommendation for mastectomy given her history. Patient was reluctant to agree to mastectomy at time of their meeting, but did agree to consultation with plastic surgery and is to see Dr Crissie Reese on 10-18-13. Dr Barry Dienes and I communicated prior to her visit and we discussed directly prior to patient being presented at multidisciplinary breast conference AM of this visit. She had staging CT CAP last PM, which has 5 mm apparent cyst in liver, nothing else of concern radiographically. We will need to follow the apparent cyst.  She had no difficulty with the biopsy and cannot tell any changes in right breast. She is otherwise feeling well. She has physical exam with PCP already scheduled within the week. She has beach vacation planned first week in Sept, but is willing to proceed with surgery as soon as Sept 10 and is willing to cancel plans for conference out of state in Oct if needed.   ONCOLOGIC HISTORY AS detailed above  Review of systems as above, also: Sinus infection this summer, resolved. No other infectious illness. No pain. Good appetite, good energy. No SOB, no change in bowels or bladder. No swelling LE. No bleeding. Remainder of 10 point Review of Systems negative.  Social History:  Working full time for [employer] Married, lives with husband. Daughter is taking a year off before college. No cigarettes. Occasional ETOH. 2 Jasmine Awe.  Family History negative for breast ca.   Objective:  Vital signs in last 24 hours:  BP 128/89  Pulse 75  Temp(Src) 98.3 F (36.8 C) (Oral)  Resp 18  Ht 5' 7"  (1.702 m)  Wt 213 lb 6.4 oz (96.798 kg)  BMI 33.42 kg/m2 Looks comfortable, talkative, husband very supportive.  Alert, oriented and appropriate. Ambulatory  without difficulty.  No alopecia  HEENT:PERRL, sclerae not icteric. Oral mucosa moist without lesions, posterior pharynx clear.  Neck supple. No JVD.  Lymphatics:no cervical,suraclavicular, axillary adenopathy Resp: clear to auscultation bilaterally and normal percussion bilaterally Cardio: regular rate and rhythm. No gallop. GI: soft, nontender, not distended, no mass or organomegaly. Normally active bowel sounds.  Musculoskeletal/ Extremities: without pitting edema, cords, tenderness. No swelling either UE. Neuro: no peripheral neuropathy. Otherwise nonfocal. PSYCH normal mood and affect Skin without rash, ecchymosis, petechiae Breasts:Right with biopsy site upper outer, not tender, no drainage. No palpable mass, no skin or nipple findings. Left TRAM well healed, without findings of concern for local recurrence.Axillae benign.  Lab Results:  Results for orders placed in visit on 10/13/13  CBC WITH DIFFERENTIAL      Result Value Ref Range   WBC 5.5  3.9 - 10.3 10e3/uL   NEUT# 3.4  1.5 - 6.5 10e3/uL   HGB 11.9  11.6 - 15.9 g/dL   HCT 37.7  34.8 - 46.6 %   Platelets 318  145 - 400 10e3/uL   MCV 88.1  79.5 - 101.0 fL   MCH 27.8  25.1 - 34.0 pg   MCHC 31.6  31.5 - 36.0 g/dL   RBC 4.28  3.70 - 5.45 10e6/uL   RDW 14.1  11.2 - 14.5 %   lymph# 1.6  0.9 - 3.3 10e3/uL   MONO# 0.4  0.1 - 0.9 10e3/uL   Eosinophils Absolute 0.1  0.0 - 0.5 10e3/uL   Basophils Absolute 0.0  0.0 - 0.1 10e3/uL   NEUT% 61.2  38.4 - 76.8 %   LYMPH% 29.1  14.0 - 49.7 %   MONO% 7.7  0.0 - 14.0 %   EOS% 1.7  0.0 - 7.0 %   BASO% 0.3  0.0 - 2.0 %  COMPREHENSIVE METABOLIC PANEL (SN05)      Result Value Ref Range   Sodium 142  136 - 145 mEq/L   Potassium 4.0  3.5 - 5.1 mEq/L   Chloride 107  98 - 109 mEq/L   CO2 24  22 - 29 mEq/L   Glucose 87  70 - 140 mg/dl   BUN 15.7  7.0 - 26.0 mg/dL   Creatinine 0.8  0.6 - 1.1 mg/dL   Total Bilirubin 0.65  0.20 - 1.20 mg/dL   Alkaline Phosphatase 74  40 - 150 U/L   AST 17   5 - 34 U/L   ALT 18  0 - 55 U/L   Total Protein 7.7  6.4 - 8.3 g/dL   Albumin 4.0  3.5 - 5.0 g/dL   Calcium 9.4  8.4 - 10.4 mg/dL   Anion Gap 11  3 - 11 mEq/L    Available after visit  CA 2729 normal at 18 and CEA normal at 0.7  Studies/Results:   Ginette Otto J Collected: 09/30/2013 Client: [facility] Accession: [id number] Received: 09/30/2013 F. Altamese Cabal, MD  REPORT OF SURGICAL PATHOLOGY ADDITIONAL INFORMATION: PROGNOSTIC INDICATORS - ACIS Results: IMMUNOHISTOCHEMICAL AND MORPHOMETRIC ANALYSIS BY THE AUTOMATED CELLULAR IMAGING SYSTEM (ACIS) Estrogen Receptor: 0%, NEGATIVE Progesterone Receptor:  0%, NEGATIVE Proliferation Marker Ki67: 87% COMMENT: The negative hormone receptor study(ies) in this case have an internal positive control. REFERENCE RANGE ESTROGEN RECEPTOR NEGATIVE <1% POSITIVE =>1% PROGESTERONE RECEPTOR NEGATIVE <1% POSITIVE =>1% All controls stained appropriately Enid Cutter MD Pathologist, Electronic Signature ( Signed 10/07/2013) CHROMOGENIC IN-SITU HYBRIDIZATION Results: HER-2/NEU BY CISH - NO AMPLIFICATION OF HER-2 DETECTED. RESULT RATIO OF HER2: CEP 17 SIGNALS 1.40 AVERAGE HER2 COPY NUMBER PER CELL 1.75 1 of 3 FINAL for KAMEAH, RAWL [phone]) ADDITIONAL INFORMATION:(continued) REFERENCE RANGE NEGATIVE HER2/Chr17 Ratio <2.0 and Average HER2 copy number <4.0 EQUIVOCAL HER2/Chr17 Ratio <2.0 and Average HER2 copy number 4.0 and <6.0 POSITIVE HER2/Chr17 Ratio >=2.0 and/or Average HER2 copy number >=6.0 Enid Cutter MD Pathologist, Electronic Signature ( Signed 10/06/2013) FINAL DIAGNOSIS Diagnosis Breast, right, needle core biopsy, 10 o'clock, RUOQ - INVASIVE MAMMARY CARCINOMA, SEE COMMENT. Microscopic Comment Although grade of tumor is best assessed at resection, with these biopsies, the invasive tumor is grade III. Differential considerations include lymph node involvement by tumor and  metastatic soft tissue tumor deposit. Breast prognostic studies are pending      Ct Chest W Contrast  10/13/2013   CLINICAL DATA:  New diagnosis breast cancer. Remote history of left breast cancer.  EXAM: CT CHEST, ABDOMEN, AND PELVIS WITH CONTRAST  TECHNIQUE: Multidetector CT imaging of the chest, abdomen and pelvis was performed following the standard protocol during bolus administration of intravenous contrast.  CONTRAST:  41m OMNIPAQUE IOHEXOL 300 MG/ML SOLN, 1020mOMNIPAQUE IOHEXOL 300 MG/ML SOLN  COMPARISON:  None.  FINDINGS: CT CHEST FINDINGS  Reconstructions surgery in the left chest wall. Lymphadenectomy clips in left axilla. There is no right axillary lymphadenopathy. No supraclavicular or internal mammary lymphadenopathy. No mediastinal lymphadenopathy. No pericardial fluid. Esophagus is normal.  Review of the lung parenchyma demonstrates scarring at the left lung apex; no suspicious pulmonary nodules.  CT ABDOMEN AND PELVIS FINDINGS  There is a 5 mm low-density lesion in the dome of the right hepatic lobe (image 44, series 2). Gallbladder, pancreas, spleen, adrenal glands, and kidneys are normal.  Stomach, small bowel, appendix, cecum normal. The colon demonstrates several diverticula through the descending colon. No acute inflammation.  Abdominal aorta is normal caliber. No retroperitoneal periportal lymphadenopathy. No mesenteric nodularity.  No free fluid the pelvis. Post hysterectomy anatomy. No adnexal abnormality. No pelvic lymphadenopathy. No aggressive osseous lesion.  IMPRESSION: Cancer CT Impression Chest Impression:  1. No evidence of metastatic adenopathy in the thorax. 2. Postsurgical change in the left chest wall. 3. Scarring at the left lung apex likely relates to radiation change. Abdomen / Pelvis Impression:  1. No metastasis in the abdomen pelvis. 2. Small sub cm hypodensity in the right hepatic lobe is likely a small hepatic cyst. 3. Mild left colon diverticulosis. 4.  Hysterectomy.   Electronically Signed   By: StSuzy Bouchard.D.   On: 10/13/2013 09:24   Ct Abdomen Pelvis W Contrast  10/13/2013   CLINICAL DATA:  New diagnosis breast cancer. Remote history of left breast cancer.  EXAM: CT CHEST, ABDOMEN, AND PELVIS WITH CONTRAST  TECHNIQUE: Multidetector CT imaging of the chest, abdomen and pelvis was performed following the standard protocol during bolus administration of intravenous contrast.  CONTRAST:  5079mMNIPAQUE IOHEXOL 300 MG/ML SOLN, 100m72mNIPAQUE IOHEXOL 300 MG/ML SOLN  COMPARISON:  None.  FINDINGS: CT CHEST FINDINGS  Reconstructions surgery in the left chest wall. Lymphadenectomy clips in left axilla. There is no right axillary lymphadenopathy. No supraclavicular or internal mammary lymphadenopathy. No  mediastinal lymphadenopathy. No pericardial fluid. Esophagus is normal.  Review of the lung parenchyma demonstrates scarring at the left lung apex; no suspicious pulmonary nodules.  CT ABDOMEN AND PELVIS FINDINGS  There is a 5 mm low-density lesion in the dome of the right hepatic lobe (image 44, series 2). Gallbladder, pancreas, spleen, adrenal glands, and kidneys are normal.  Stomach, small bowel, appendix, cecum normal. The colon demonstrates several diverticula through the descending colon. No acute inflammation.  Abdominal aorta is normal caliber. No retroperitoneal periportal lymphadenopathy. No mesenteric nodularity.  No free fluid the pelvis. Post hysterectomy anatomy. No adnexal abnormality. No pelvic lymphadenopathy. No aggressive osseous lesion.  IMPRESSION: Cancer CT Impression Chest Impression:  1. No evidence of metastatic adenopathy in the thorax. 2. Postsurgical change in the left chest wall. 3. Scarring at the left lung apex likely relates to radiation change. Abdomen / Pelvis Impression:  1. No metastasis in the abdomen pelvis. 2. Small sub cm hypodensity in the right hepatic lobe is likely a small hepatic cyst. 3. Mild left colon  diverticulosis. 4. Hysterectomy.   Electronically Signed   By: Suzy Bouchard M.D.   On: 10/13/2013 09:24        DIGITAL DIAGNOSTIC right breast MAMMOGRAM WITH CAD 09-30-13 ULTRASOUND right BREAST  COMPARISON: Previous examinations the most recent of which is dated  09/23/2013.  ACR Breast Density Category c: The breast tissue is heterogeneously  dense, which may obscure small masses.  FINDINGS:  There is an oval circumscribed mass located within the upper outer  quadrant of the right breast with lucency along its superior border.  Most likely this represents an intramammary lymph node which has  increased in size.  Mammographic images were processed with CAD.  On physical exam, there is no discrete palpable abnormality within  the upper outer quadrant of the right breast  Ultrasound is performed, showing there is an oval mass with a  central cleft located within the right breast at 10 o'clock position  17 cm from the nipple corresponding to the mammographic finding.  Most likely this represents an intramammary lymph node. However,  there is eccentric cortical thickening associated with this lymph  node and this does represent change when compare with prior studies.  This measures 6 x 5 x 4 mm in size. Tissue sampling is recommended.  I discussed ultrasound-guided core biopsy with clip placement of  this mass with the patient. This will be performed and reported  separately.  IMPRESSION:  6 mm suspicious mass located within the upper outer quadrant of the  right breast at the 10 o'clock position 17 cm from the nipple. Most  likely this represents an abnormal intramammary lymph node. Tissue  sampling is recommended and ultrasound-guided core biopsy will be  performed and reported separately.  RECOMMENDATION:  Right breast ultrasound-guided core biopsy.     CLINICAL DATA: Patient with remote history of left mastectomy  status post left myocutaneous flap reconstruction.   EXAM:  BILATERAL BREAST MRI WITH AND WITHOUT CONTRAST  09-27-13 TECHNIQUE:  Multiplanar, multisequence MR images of both breasts were obtained  prior to and following the intravenous administration of 68m of  MultiHance.  THREE-DIMENSIONAL MR IMAGE RENDERING ON INDEPENDENT WORKSTATION:  Three-dimensional MR images were rendered by post-processing of the  original MR data on an independent workstation. The  three-dimensional MR images were interpreted, and findings are  reported in the following complete MRI report for this study. Three  dimensional images were evaluated at the independent DynaCad  workstation  COMPARISON: Prior exams including prior MRI 10/05/2010; 10/04/2011  and 09/25/2012.  FINDINGS:  Breast composition: c: Heterogeneous fibroglandular tissue  Background parenchymal enhancement: Mild  Right breast: Within the upper-outer aspect of the right breast  posterior depth is a new circumscribed enhancing 9 x 5 x 6 mm mass.  This demonstrates associated increased T2 signal. No additional  concerning areas of enhancement are identified within the right  breast.  Left breast: Normal appearing myocutaneous flap reconstruction.  Lymph nodes: No abnormal appearing lymph nodes.  Ancillary findings: None.  IMPRESSION:  New 9 mm enhancing mass within the upper-outer right breast  posteriorly with associated T2 signal. This may potentially  represent a new intramammary lymph node. Additionally this is felt  to correspond with the new mass identified on recent screening  mammogram for which the patient has been recalled. Recommend  continued diagnostic mammographic evaluation and second-look right  breast ultrasound and possible biopsy.  RECOMMENDATION:  Second look ultrasound for new mass within the upper-outer right  breast posteriorly on MRI and mammography. If the mass demonstrates  concerning features on ultrasound, consider ultrasound-guided core  needle biopsy. If the  mass is sonographically occult, recommend  further evaluation with MRI guided core needle biopsy.    DIGITAL SCREENING UNILATERAL RIGHT MAMMOGRAM WITH TOMO AND CAD 09-24-13 COMPARISON: Previous exam(s)  ACR Breast Density Category c: The breast tissue is heterogeneously  dense, which may obscure small masses.  FINDINGS:  In the right breast, a possible mass warrants further evaluation  with spot compression views and possibly ultrasound. In the left  breast, no findings suspicious for malignancy. Images were processed  with CAD.  IMPRESSION:  Further evaluation is suggested for possible mass in the right  breast.  RECOMMENDATION:  Diagnostic mammogram and possibly ultrasound of the right breast.  (Code:FI-R-61M)       Medications: I have reviewed the patient's current medications.  DISCUSSION: we have discussed circumstances surrounding this new diagnosis of small triple negative breast cancer. Dr Barry Dienes and I both recommend mastectomy given the multiple BRCA 1 and 2 variants, as was general consensus from multidisciplinary breast conference today, however this is still her decision. She may need radiation if lumpectomy only is done. Depending on final path size of the tumor, she may need adjuvant chemotherapy for this triple negative malignancy. We have discussed implications of triple negative for treatment and fact that these can be more aggressive malignancies.  I have expressed my concern that, with high proliferation fraction of the tumor, delay in surgery may allow significant growth. We have discussed sentinel nodes vs axillary dissection. If mastectomy, she does want reconstruction, and would prefer that at time of mastectomy; I have explained that this requires coordination of OR time with general surgeon and plastic surgeon, so possible that there could be some delay getting this arranged. We have mentioned another TRAM or other flap, or possibly tissue expander and saline  implant. NOTE she especially wants the reconstructions to be symmetrical.  By completion of lengthy discussion, she is in agreement with right mastectomy. She would like to go on beach trip in ~ a week, but could have surgery after Sept 10. She understands that I will let other MDs know. If mastectomy she may not need RT, but seems reasonable to have consultation with Dr Isidore Moos, as it will be useful going forward to have the remote RT treatment information recovered for present EMR.  Assessment/Plan:  1.New triple negative invasive ductal carcinoma of right  breast, in patient with history of node + premenopausal left breast cancer 1996 and multiple BRCA 1 and 2 variants. Size by various imaging techniques is ~ 5-6 mm or possibly up to 9 mm, no imaging evidence of adenopathy and body CTs without obvious concern for metastatic disease. From medical standpoint, mastectomy seems most appropriate, with axillary node evaluation. She may need chemotherapy depending on size of tumor. Radiation not as likely to be needed if mastectomy, but I think proactive to get old RT information into EMR in case needed in future. With high proliferation fraction, I would be in favor of surgery in near future. I will schedule next appointment with medical oncology with surgery date is known. 2.post hysterectomy and oophorectomy 2011 3.history of allergic sinusitis 4.5 mm apparent cyst in liver by CT. Follow   TIme spent 50+ min including >50% counseling and coordination of care. Cc this note Drs Santina Evans.   Gordy Levan, MD   10/13/2013, 4:34 PM

## 2013-10-14 ENCOUNTER — Telehealth: Payer: Self-pay | Admitting: Oncology

## 2013-10-14 LAB — CANCER ANTIGEN 27.29: CA 27.29: 18 U/mL (ref 0–39)

## 2013-10-14 LAB — CEA: CEA: 0.7 ng/mL (ref 0.0–5.0)

## 2013-10-14 NOTE — Telephone Encounter (Signed)
Medical Oncology  LM on cell [phone] and on home machine [phone] to let patient know I need to speak with her to clarify 2 things from visit on 10-13-13. She can call back to my office cell on 8-27 or to office on 8-28.  L.Livesay,MD

## 2013-10-15 ENCOUNTER — Telehealth: Payer: Self-pay | Admitting: Oncology

## 2013-10-15 NOTE — Telephone Encounter (Signed)
Medical Oncology  Returned call to patient following my call to her on 10-14-13. Explained that she still may need chemo after mastectomy depending on size of primary, as chemo is considered in triple negative cancers if >=5 mm. Recommended that she keep radiation oncology appointment, even if mastectomy is choice, as it would be good to have summary of past RT easily available in current medical records.  Patient understands and agrees.  Godfrey Pick, MD

## 2013-10-19 ENCOUNTER — Encounter: Payer: Self-pay | Admitting: Radiation Oncology

## 2013-10-19 NOTE — Progress Notes (Signed)
Radiation Oncology         [phone] ________________________________  Initial outpatient Consultation  Name: Heidi Sexton MRN: [id number]  Date: 10/20/2013  DOB: [birth year]  XT:KWIOXB,DZHGDJ, MD  [name], MD   REFERRING PHYSICIAN: [name], MD  DIAGNOSIS: Stage I, T1bN0M0 Right breast triple negative grade III invasive mammary carcinoma, UOQ  HISTORY OF PRESENT ILLNESS::Heidi Sexton is a 53 y.o. female who has a prior history of left breast cancer diagnosed in 1996, when she was [redacted] weeks pregnant. Per med/onc notes, this was ER PR negative, HER 2 not tested.  I believe this was Stage pT2N3M0. She received adriamycin while pregnant, followed by a mastectomy with 11 or 13 nodes positive out of 23. She went on to receive adjuvant chemotherapy with high dose with autologous transplant at [facility], and radiotherapy at [facility].    MRI of breasts in Aug 2013 and Aug 2014 were negative. Mammograms of breasts in June 2013 and 2014 were negative. But, mammogram on 09-24-13 showed a possible right breast mass. MRI on 09-27-13 notable for a 39m enhancing mass in the RUOQ. Left TRAM and b/l nodes unremarkable.    Spot compression and UKoreaviews on 09-30-13 reported a "6 mm suspicious mass located within the upper outer quadrant of the right breast at the 10 o'clock position 17 cm from the nipple. Most likely this represents an abnormal intramammary lymph node."  CT of CAP on 10-13-13 negative for metastastic disease.  Biopsy on 09-30-13 revealed: Breast, right, needle core biopsy, 10 o'clock, RUOQ - INVASIVE MAMMARY CARCINOMA, SEE COMMENT. Microscopic Comment Although grade of tumor is best assessed at resection, with these biopsies, the invasive tumor is grade III.  It is unclear whether the lesion is an intramammary node vs parenchymal breast tumor.   She has plans for mastectomy and immediate reconstruction in the near future. Chemotherapy plans are pending final pathology. She  has tested positive for multiple BRCA 1 and 2 variants but had refused prophylactic mastectomy before her most recent diagnosis.   PREVIOUS RADIATION THERAPY: Yes RT to left chest wall (and presumably nodes) [facility] Will try to retrieve files.  PAST MEDICAL HISTORY:  has a past medical history of Cancer and Breast cancer.    PAST SURGICAL HISTORY: Past Surgical History  Procedure Laterality Date  . Breast surgery    . Abdominal hysterectomy  2011    FAMILY HISTORY: family history includes Liver cancer in her mother.  SOCIAL HISTORY:  reports that she has never smoked. She has never used smokeless tobacco. She reports that she does not drink alcohol or use illicit drugs.  ALLERGIES: Adhesive; Aleve; and Advil  MEDICATIONS:  Current Outpatient Prescriptions  Medication Sig Dispense Refill  . Cholecalciferol (VITAMIN D) 2000 UNITS tablet Take 2,000 Units by mouth daily.      .Marland Kitchenloratadine (CLARITIN) 10 MG tablet Take 10 mg by mouth daily as needed.       No current facility-administered medications for this encounter.    REVIEW OF SYSTEMS:  Notable for that above.   PHYSICAL EXAM:  height is 5' 7"  (1.702 m) and weight is 212 lb 8 oz (96.389 kg). Her oral temperature is 98 F (36.7 C). Her blood pressure is 106/77 and her pulse is 83. Her respiration is 16 and oxygen saturation is 100%.   General: Alert and oriented, in no acute distress HEENT: Head is normocephalic  Extraocular movements are intact. Oropharynx is clear. Neck: Neck is supple,  no palpable cervical or supraclavicular lymphadenopathy. Heart: Regular in rate and rhythm with no murmurs, rubs, or gallops. Chest: Clear to auscultation bilaterally, with no rhonchi, wheezes, or rales. Abdomen: Soft, nontender, nondistended, with no rigidity or guarding. Extremities: No cyanosis or edema. Lymphatics: No concerning lymphadenopathy. Skin: No concerning lesions. Musculoskeletal: symmetric strength and muscle tone  throughout. Neurologic: Cranial nerves II through XII are grossly intact. No obvious focalities. Speech is fluent. Coordination is intact. Psychiatric: Judgment and insight are intact. Affect is appropriate. BREASTS: Cannot appreciate any obvious lumps/bumps in right breast or left TRAM flap. No concerning skin lesions, no axillary nodes appreciated   ECOG = 0  0 - Asymptomatic (Fully active, able to carry on all predisease activities without restriction)  1 - Symptomatic but completely ambulatory (Restricted in physically strenuous activity but ambulatory and able to carry out work of a light or sedentary nature. For example, light housework, office work)  2 - Symptomatic, <50% in bed during the day (Ambulatory and capable of all self care but unable to carry out any work activities. Up and about more than 50% of waking hours)  3 - Symptomatic, >50% in bed, but not bedbound (Capable of only limited self-care, confined to bed or chair 50% or more of waking hours)  4 - Bedbound (Completely disabled. Cannot carry on any self-care. Totally confined to bed or chair)  5 - Death   Eustace Pen MM, Creech RH, Tormey DC, et al. [phone]). "Toxicity and response criteria of the [facility]". [place] Oncol. 5 (6): 649-55   LABORATORY DATA:  Lab Results  Component Value Date   WBC 5.5 10/13/2013   HGB 11.9 10/13/2013   HCT 37.7 10/13/2013   MCV 88.1 10/13/2013   PLT 318 10/13/2013   CMP     Component Value Date/Time   NA 142 10/13/2013 1426   NA 140 09/02/2011 1106   K 4.0 10/13/2013 1426   K 4.6 09/02/2011 1106   CL 107 09/02/2011 1106   CO2 24 10/13/2013 1426   CO2 24 09/02/2011 1106   GLUCOSE 87 10/13/2013 1426   GLUCOSE 94 09/02/2011 1106   BUN 15.7 10/13/2013 1426   BUN 19 09/02/2011 1106   CREATININE 0.8 10/13/2013 1426   CREATININE 0.94 09/02/2011 1106   CALCIUM 9.4 10/13/2013 1426   CALCIUM 9.4 09/02/2011 1106   PROT 7.7 10/13/2013 1426   PROT 7.1 09/02/2011 1106   ALBUMIN  4.0 10/13/2013 1426   ALBUMIN 4.5 09/02/2011 1106   AST 17 10/13/2013 1426   AST 17 09/02/2011 1106   ALT 18 10/13/2013 1426   ALT 17 09/02/2011 1106   ALKPHOS 74 10/13/2013 1426   ALKPHOS 64 09/02/2011 1106   BILITOT 0.65 10/13/2013 1426   BILITOT 0.4 09/02/2011 1106   GFRNONAA >60 08/22/2009 1611   GFRAA  Value: >60        The eGFR has been calculated using the MDRD equation. This calculation has not been validated in all clinical situations. eGFR's persistently <60 mL/min signify possible Chronic Kidney Disease. 08/22/2009 1611         RADIOGRAPHY: Ct Chest W Contrast  10/13/2013   CLINICAL DATA:  New diagnosis breast cancer. Remote history of left breast cancer.  EXAM: CT CHEST, ABDOMEN, AND PELVIS WITH CONTRAST  TECHNIQUE: Multidetector CT imaging of the chest, abdomen and pelvis was performed following the standard protocol during bolus administration of intravenous contrast.  CONTRAST:  39m OMNIPAQUE IOHEXOL 300 MG/ML SOLN, 1070mOMNIPAQUE IOHEXOL  300 MG/ML SOLN  COMPARISON:  None.  FINDINGS: CT CHEST FINDINGS  Reconstructions surgery in the left chest wall. Lymphadenectomy clips in left axilla. There is no right axillary lymphadenopathy. No supraclavicular or internal mammary lymphadenopathy. No mediastinal lymphadenopathy. No pericardial fluid. Esophagus is normal.  Review of the lung parenchyma demonstrates scarring at the left lung apex; no suspicious pulmonary nodules.  CT ABDOMEN AND PELVIS FINDINGS  There is a 5 mm low-density lesion in the dome of the right hepatic lobe (image 44, series 2). Gallbladder, pancreas, spleen, adrenal glands, and kidneys are normal.  Stomach, small bowel, appendix, cecum normal. The colon demonstrates several diverticula through the descending colon. No acute inflammation.  Abdominal aorta is normal caliber. No retroperitoneal periportal lymphadenopathy. No mesenteric nodularity.  No free fluid the pelvis. Post hysterectomy anatomy. No adnexal abnormality. No pelvic  lymphadenopathy. No aggressive osseous lesion.  IMPRESSION: Cancer CT Impression Chest Impression:  1. No evidence of metastatic adenopathy in the thorax. 2. Postsurgical change in the left chest wall. 3. Scarring at the left lung apex likely relates to radiation change. Abdomen / Pelvis Impression:  1. No metastasis in the abdomen pelvis. 2. Small sub cm hypodensity in the right hepatic lobe is likely a small hepatic cyst. 3. Mild left colon diverticulosis. 4. Hysterectomy.   Electronically Signed   By: Suzy Bouchard M.D.   On: 10/13/2013 09:24   Ct Abdomen Pelvis W Contrast  10/13/2013   CLINICAL DATA:  New diagnosis breast cancer. Remote history of left breast cancer.  EXAM: CT CHEST, ABDOMEN, AND PELVIS WITH CONTRAST  TECHNIQUE: Multidetector CT imaging of the chest, abdomen and pelvis was performed following the standard protocol during bolus administration of intravenous contrast.  CONTRAST:  44m OMNIPAQUE IOHEXOL 300 MG/ML SOLN, 1061mOMNIPAQUE IOHEXOL 300 MG/ML SOLN  COMPARISON:  None.  FINDINGS: CT CHEST FINDINGS  Reconstructions surgery in the left chest wall. Lymphadenectomy clips in left axilla. There is no right axillary lymphadenopathy. No supraclavicular or internal mammary lymphadenopathy. No mediastinal lymphadenopathy. No pericardial fluid. Esophagus is normal.  Review of the lung parenchyma demonstrates scarring at the left lung apex; no suspicious pulmonary nodules.  CT ABDOMEN AND PELVIS FINDINGS  There is a 5 mm low-density lesion in the dome of the right hepatic lobe (image 44, series 2). Gallbladder, pancreas, spleen, adrenal glands, and kidneys are normal.  Stomach, small bowel, appendix, cecum normal. The colon demonstrates several diverticula through the descending colon. No acute inflammation.  Abdominal aorta is normal caliber. No retroperitoneal periportal lymphadenopathy. No mesenteric nodularity.  No free fluid the pelvis. Post hysterectomy anatomy. No adnexal abnormality. No  pelvic lymphadenopathy. No aggressive osseous lesion.  IMPRESSION: Cancer CT Impression Chest Impression:  1. No evidence of metastatic adenopathy in the thorax. 2. Postsurgical change in the left chest wall. 3. Scarring at the left lung apex likely relates to radiation change. Abdomen / Pelvis Impression:  1. No metastasis in the abdomen pelvis. 2. Small sub cm hypodensity in the right hepatic lobe is likely a small hepatic cyst. 3. Mild left colon diverticulosis. 4. Hysterectomy.   Electronically Signed   By: StSuzy Bouchard.D.   On: 10/13/2013 09:24   Mr Breast Bilateral W Wo Contrast  09/27/2013   CLINICAL DATA:  Patient with remote history of left mastectomy status post left myocutaneous flap reconstruction.  EXAM: BILATERAL BREAST MRI WITH AND WITHOUT CONTRAST  TECHNIQUE: Multiplanar, multisequence MR images of both breasts were obtained prior to and following the intravenous administration of  84m of MultiHance.  THREE-DIMENSIONAL MR IMAGE RENDERING ON INDEPENDENT WORKSTATION:  Three-dimensional MR images were rendered by post-processing of the original MR data on an independent workstation. The three-dimensional MR images were interpreted, and findings are reported in the following complete MRI report for this study. Three dimensional images were evaluated at the independent DynaCad workstation  COMPARISON:  Prior exams including prior MRI 10/05/2010; 10/04/2011 and 09/25/2012.  FINDINGS: Breast composition: c:  Heterogeneous fibroglandular tissue  Background parenchymal enhancement: Mild  Right breast: Within the upper-outer aspect of the right breast posterior depth is a new circumscribed enhancing 9 x 5 x 6 mm mass. This demonstrates associated increased T2 signal. No additional concerning areas of enhancement are identified within the right breast.  Left breast: Normal appearing myocutaneous flap reconstruction.  Lymph nodes: No abnormal appearing lymph nodes.  Ancillary findings:  None.   IMPRESSION: New 9 mm enhancing mass within the upper-outer right breast posteriorly with associated T2 signal. This may potentially represent a new intramammary lymph node. Additionally this is felt to correspond with the new mass identified on recent screening mammogram for which the patient has been recalled. Recommend continued diagnostic mammographic evaluation and second-look right breast ultrasound and possible biopsy.  RECOMMENDATION: Second look ultrasound for new mass within the upper-outer right breast posteriorly on MRI and mammography. If the mass demonstrates concerning features on ultrasound, consider ultrasound-guided core needle biopsy. If the mass is sonographically occult, recommend further evaluation with MRI guided core needle biopsy.  BI-RADS CATEGORY  4: Suspicious.   Electronically Signed   By: DLovey NewcomerM.D.   On: 09/27/2013 10:23   Mm Digital Diagnostic Unilat R  10/04/2013   CLINICAL DATA:  Post right breast ultrasound-guided core biopsy.  EXAM: DIAGNOSTIC right breast MAMMOGRAM POST ULTRASOUND BIOPSY  COMPARISON:  Previous exams  FINDINGS: Mammographic images were obtained following ultrasound guided biopsy of the mass located within the right breast at the 10 o'clock position. The ribbon shaped clip is in appropriate position.  IMPRESSION: Appropriate position of clip following right breast ultrasound-guided core biopsy  Final Assessment: Post Procedure Mammograms for Marker Placement   Electronically Signed   By: RLuberta RobertsonM.D.   On: 10/04/2013 10:03   Mm Digital Diagnostic Unilat R  09/30/2013   CLINICAL DATA:  The patient has a history of a previous malignant mastectomy of the left breast for a T2 carcinoma of the left breast in 1996 ( she was [redacted] weeks pregnant at the time). Left axillary lymph node dissection was performed as well with 11 positive left axillary lymph nodes. The patient received chemotherapy. She also gives a history of multiple BRCA 1 and BRCA 2  mutations. She has undergone previous hysterectomy and bilateral oophorectomy. She has been followed by screening mammography and breast MRI. Recent right breast screening mammogram and breast MRI demonstrated a new oval mass within the upper-outer quadrant of the right breast. Diagnostic evaluation was recommended.  EXAM: DIGITAL DIAGNOSTIC  right breast MAMMOGRAM WITH CAD  ULTRASOUND right BREAST  COMPARISON:  Previous examinations the most recent of which is dated 09/23/2013.  ACR Breast Density Category c: The breast tissue is heterogeneously dense, which may obscure small masses.  FINDINGS: There is an oval circumscribed mass located within the upper outer quadrant of the right breast with lucency along its superior border. Most likely this represents an intramammary lymph node which has increased in size.  Mammographic images were processed with CAD.  On physical exam, there is no discrete palpable  abnormality within the upper outer quadrant of the right breast  Ultrasound is performed, showing there is an oval mass with a central cleft located within the right breast at 10 o'clock position 17 cm from the nipple corresponding to the mammographic finding. Most likely this represents an intramammary lymph node. However, there is eccentric cortical thickening associated with this lymph node and this does represent change when compare with prior studies. This measures 6 x 5 x 4 mm in size. Tissue sampling is recommended. I discussed ultrasound-guided core biopsy with clip placement of this mass with the patient. This will be performed and reported separately.  IMPRESSION: 6 mm suspicious mass located within the upper outer quadrant of the right breast at the 10 o'clock position 17 cm from the nipple. Most likely this represents an abnormal intramammary lymph node. Tissue sampling is recommended and ultrasound-guided core biopsy will be performed and reported separately.  RECOMMENDATION: Right breast  ultrasound-guided core biopsy.  I have discussed the findings and recommendations with the patient. Results were also provided in writing at the conclusion of the visit. If applicable, a reminder letter will be sent to the patient regarding the next appointment.  BI-RADS CATEGORY  4: Suspicious.   Electronically Signed   By: Luberta Robertson M.D.   On: 09/30/2013 16:29   US Breast Ltd Uni Right Inc Axilla  09/30/2013   CLINICAL DATA:  The patient has a history of a previous malignant mastectomy of the left breast for a T2 carcinoma of the left breast in 1996 ( she was [redacted] weeks pregnant at the time). Left axillary lymph node dissection was performed as well with 11 positive left axillary lymph nodes. The patient received chemotherapy. She also gives a history of multiple BRCA 1 and BRCA 2 mutations. She has undergone previous hysterectomy and bilateral oophorectomy. She has been followed by screening mammography and breast MRI. Recent right breast screening mammogram and breast MRI demonstrated a new oval mass within the upper-outer quadrant of the right breast. Diagnostic evaluation was recommended.  EXAM: DIGITAL DIAGNOSTIC  right breast MAMMOGRAM WITH CAD  ULTRASOUND right BREAST  COMPARISON:  Previous examinations the most recent of which is dated 09/23/2013.  ACR Breast Density Category c: The breast tissue is heterogeneously dense, which may obscure small masses.  FINDINGS: There is an oval circumscribed mass located within the upper outer quadrant of the right breast with lucency along its superior border. Most likely this represents an intramammary lymph node which has increased in size.  Mammographic images were processed with CAD.  On physical exam, there is no discrete palpable abnormality within the upper outer quadrant of the right breast  Ultrasound is performed, showing there is an oval mass with a central cleft located within the right breast at 10 o'clock position 17 cm from the nipple corresponding  to the mammographic finding. Most likely this represents an intramammary lymph node. However, there is eccentric cortical thickening associated with this lymph node and this does represent change when compare with prior studies. This measures 6 x 5 x 4 mm in size. Tissue sampling is recommended. I discussed ultrasound-guided core biopsy with clip placement of this mass with the patient. This will be performed and reported separately.  IMPRESSION: 6 mm suspicious mass located within the upper outer quadrant of the right breast at the 10 o'clock position 17 cm from the nipple. Most likely this represents an abnormal intramammary lymph node. Tissue sampling is recommended and ultrasound-guided core biopsy will be performed  and reported separately.  RECOMMENDATION: Right breast ultrasound-guided core biopsy.  I have discussed the findings and recommendations with the patient. Results were also provided in writing at the conclusion of the visit. If applicable, a reminder letter will be sent to the patient regarding the next appointment.  BI-RADS CATEGORY  4: Suspicious.   Electronically Signed   By: Luberta Robertson M.D.   On: 09/30/2013 16:29   Mm Screening Breast Tomo Uni R  09/24/2013   CLINICAL DATA:  Screening.  EXAM: DIGITAL SCREENING UNILATERAL RIGHT MAMMOGRAM WITH TOMO AND CAD  COMPARISON:  Previous exam(s)  ACR Breast Density Category c: The breast tissue is heterogeneously dense, which may obscure small masses.  FINDINGS: In the right breast, a possible mass warrants further evaluation with spot compression views and possibly ultrasound. In the left breast, no findings suspicious for malignancy. Images were processed with CAD.  IMPRESSION: Further evaluation is suggested for possible mass in the right breast.  RECOMMENDATION: Diagnostic mammogram and possibly ultrasound of the right breast. (Code:[id number])  The patient will be contacted regarding the findings, and additional imaging will be scheduled.  BI-RADS  CATEGORY  0: Incomplete. Need additional imaging evaluation and/or prior mammograms for comparison.   Electronically Signed   By: Lovey Newcomer M.D.   On: 09/24/2013 15:49   [place] Rt Breast Bx W Loc Dev 1st Lesion Img Bx Spec US Guide  10/04/2013   ADDENDUM REPORT: 10/01/2013 12:15  ADDENDUM: Pathology revealed grade 3 invasive mammary carcinoma in the right breast. This was found to be concordant by Dr. Curlene Dolphin. Pathology was discussed with the patient by telephone. She reported doing well after the biopsy. Post biopsy instructions were reviewed and her questions were answered. The patient has a history of left breast cancer diagnosed in 1996, while she was [redacted] weeks pregnant. She underwent chemotherapy, mastectomy, and axillary dissection (11 positive lymph nodes). She has known BRCA 1 and 2 mutations and has had a hysterectomy and oophorectomy. Surgical referral has been scheduled with Dr. [name] at [facility] on October 11, 2013. She also has an appointment with her medical oncologist, Dr. Evlyn Clines, on October 13, 2013. She has already had an MRI. My number was provided to the patient for future questions and concerns.  Pathology results reported by Susa Raring RN, BSN on October 01, 2013.   Electronically Signed   By: Luberta Robertson M.D.   On: 10/01/2013 12:15   10/04/2013   CLINICAL DATA:  History of malignant mastectomy of the left breast in 1996 for a T2 tumor. At that time 11 positive lymph nodes were present on left axillary lymph node dissection. She received chemotherapy. The patient was [redacted] weeks pregnant at the time of her diagnosis. The patient has a history of multiple BRCA 1 and BRCA 2 mutations. Breast MRI and recent right breast mammogram demonstrated a new small mass within the upper outer quadrant of the right breast.  EXAM: ULTRASOUND GUIDED right BREAST CORE NEEDLE BIOPSY  COMPARISON:  Previous exams.  PROCEDURE: I met with the patient and we discussed the  procedure of ultrasound-guided biopsy, including benefits and alternatives. We discussed the high likelihood of a successful procedure. We discussed the risks of the procedure including infection, bleeding, tissue injury, clip migration, and inadequate sampling. Informed written consent was given. The usual time-out protocol was performed immediately prior to the procedure.  Using sterile technique and 2% Lidocaine as local anesthetic, under direct ultrasound visualization, a 12 gauge  vacuum assisteddevice was used to perform biopsy of the small mass located within the right breast at the 10 o'clock position 17 cm from the nipple using a lateral approach. At the conclusion of the procedure, a ribbon shaped tissue marker clip was deployed into the biopsy cavity. Follow-up 2-view mammogram was performed and dictated separately.  IMPRESSION: Ultrasound-guided biopsy of the right breast mass located at the 10 o'clock position 17 cm from the nipple as discussed above. No apparent complications.  Electronically Signed: By: Luberta Robertson M.D. On: 09/30/2013 16:59      IMPRESSION/PLAN: This is a lovely 53 year old woman who was recently diagnosed with her second breast cancer contralaterally. Extensive history as described above. Due to BRCA positivity, I concur with the plan for mastectomy at this time. Whether the lesion that was biopsied is an intramammary node or a new breast parenchymal lesion, since clinically her axillary nodes are negative, I think immediately reconstruction is warranted. If she does not show any positive axillary lymph nodes on pathology, I do not think she will need radiotherapy.  I wished her the very best.  In the meantime I will try to obtain her written records 1996 to document her radiation history in the electronic medical record.    __________________________________________   Eppie Gibson, MD

## 2013-10-19 NOTE — Progress Notes (Signed)
Location of Breast Cancer:Right Upper Outer Quadrant  Histology per Pathology Report:  09-30-13 revealed:  Breast, right, needle core biopsy, 10 o'clock, RUOQ  - INVASIVE MAMMARY CARCINOMA, SEE COMMENT.  Microscopic Comment   Receptor Status: ER(0%), PR (0%), Her2-neu (No amplification), Ki-67(87%)  Heidi Sexton had a mammogram on 09-24-13 which showed a possible right breast mass. MRI on 09-27-13 notable for a 66m enhancing mass in the RUOQ. Left TRAM and b/l nodes unremarkable.  Spot compression and [facility]iews on 09-30-13 reported a "6 mm suspicious mass located within the upper outer quadrant of the right breast at the 10 o'clock position 17 cm from the nipple. Most likely this represents an abnormal intramammary lymph node." CT of CAP on 10-13-13 negative for metastastic disease.    Past/Anticipated interventions by surgeon, if any: Dr. FStark Klein Right Needle Core Biospy of the Right Upper Outer Quadrant  Past/Anticipated interventions by medical oncology, if any: Chemotherapy: Adriamycin 1996 for left  breast cancer  Lymphedema issues, if any: None  Pain issues, if any:  None  SAFETY ISSUES:  Prior radiation? Yes, 1996-7, Left Breast  Pacemaker/ICD? No  Possible current pregnancy? No  Is the patient on methotrexate? No  Current Complaints / other details:  First breast cancer on left in June 1996, when she was [redacted] weeks pregnant. This was ER PR negative, HER 2 not tested in 1996. She was treated with adriamycin as she completed the pregnancy (daughter Heidi Nuttingnow 89years old and healthy), had mastectomy with 11 or 13 nodes positive out of 23 nodes, additional [facility] and local radiation on left. She has had no known active disease since treatment was completed in 1997.

## 2013-10-20 ENCOUNTER — Encounter: Payer: Self-pay | Admitting: Radiation Oncology

## 2013-10-20 ENCOUNTER — Other Ambulatory Visit (INDEPENDENT_AMBULATORY_CARE_PROVIDER_SITE_OTHER): Payer: Self-pay | Admitting: General Surgery

## 2013-10-20 ENCOUNTER — Telehealth (INDEPENDENT_AMBULATORY_CARE_PROVIDER_SITE_OTHER): Payer: Self-pay | Admitting: General Surgery

## 2013-10-20 ENCOUNTER — Ambulatory Visit
Admission: RE | Admit: 2013-10-20 | Discharge: 2013-10-20 | Disposition: A | Discharge status: Home / Self Care | Payer: 59 | Source: Ambulatory Visit | Attending: Radiation Oncology | Admitting: Radiation Oncology

## 2013-10-20 VITALS — BP 106/77 | HR 83 | Temp 98.0°F | Resp 16 | Ht 67.0 in | Wt 212.5 lb

## 2013-10-20 DIAGNOSIS — Z923 Personal history of irradiation: Secondary | ICD-10-CM | POA: Diagnosis not present

## 2013-10-20 DIAGNOSIS — Z510 Encounter for antineoplastic radiation therapy: Secondary | ICD-10-CM | POA: Insufficient documentation

## 2013-10-20 DIAGNOSIS — Z90710 Acquired absence of both cervix and uterus: Secondary | ICD-10-CM | POA: Insufficient documentation

## 2013-10-20 DIAGNOSIS — D0591 Unspecified type of carcinoma in situ of right breast: Secondary | ICD-10-CM

## 2013-10-20 DIAGNOSIS — C50419 Malignant neoplasm of upper-outer quadrant of unspecified female breast: Secondary | ICD-10-CM | POA: Diagnosis not present

## 2013-10-20 DIAGNOSIS — Z9221 Personal history of antineoplastic chemotherapy: Secondary | ICD-10-CM | POA: Diagnosis not present

## 2013-10-20 DIAGNOSIS — Z171 Estrogen receptor negative status [ER-]: Secondary | ICD-10-CM | POA: Insufficient documentation

## 2013-10-20 DIAGNOSIS — Z853 Personal history of malignant neoplasm of breast: Secondary | ICD-10-CM | POA: Diagnosis not present

## 2013-10-20 DIAGNOSIS — C50911 Malignant neoplasm of unspecified site of right female breast: Secondary | ICD-10-CM

## 2013-10-20 DIAGNOSIS — C50411 Malignant neoplasm of upper-outer quadrant of right female breast: Secondary | ICD-10-CM

## 2013-10-20 HISTORY — DX: Malignant neoplasm of unspecified site of unspecified female breast: C50.919

## 2013-10-20 NOTE — Progress Notes (Signed)
Reports occasional sinus headache and rare migraines. Denies nausea, vomiting, dizziness, weight loss, or night sweats. Healed well following ultrasound and needle biopsy done 8/13. Reports incision remain sore. 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 Vitals stable. Reports that she had chemo and radiation at [facility] in 1996 and high dose chemo at [facility]. Denies nipple discharge from right breast. Reports mass was found via routine mammogram and MRI. Denies palpating any lumps or bump in her right breast. Denies lymphedema of either upper extremity. Planning a trip to the beach on Saturday. Understands she may possibly have to have chemotherapy. Reports she understands the plan to be mastectomy, reconstruction and no radiation.

## 2013-10-20 NOTE — Telephone Encounter (Signed)
Discussed surgery with patient.  Will plan right mastectomy with sln bx and Dr. Harlow Mares to do reconstruction.

## 2013-10-20 NOTE — Progress Notes (Signed)
Please see the Nurse Progress Note in the MD Initial Consult Encounter for this patient. 

## 2013-10-22 ENCOUNTER — Telehealth: Payer: Self-pay

## 2013-10-22 NOTE — Telephone Encounter (Signed)
Faxed completed  FMLA forms dated 10-20-13 to Ms. McDougal. Sent a copy to HIM to be scanned into patient's EMR.

## 2013-11-01 ENCOUNTER — Encounter (INDEPENDENT_AMBULATORY_CARE_PROVIDER_SITE_OTHER): Payer: 59 | Admitting: General Surgery

## 2013-11-01 ENCOUNTER — Other Ambulatory Visit (INDEPENDENT_AMBULATORY_CARE_PROVIDER_SITE_OTHER): Payer: Self-pay | Admitting: General Surgery

## 2013-11-01 NOTE — H&P (Signed)
Heidi Sexton 11/01/2013 10:50 AM Location: [facility] Patient #: [id number] DOB: [birth year] Married / Language: English / Race: Black or African American Female History of Present Illness Heidi Klein MD; 11/01/2013 11:43 AM) Patient words: 2 week breast follow up.  The patient is a 53 year old female who presents for a follow-up for Breast cancer. The patient is being seen for a consultation for Stage I ( T1 and N0 ) invasive ductal carcinoma (Mammary) of the right breast (upper outer quadrant). Tumor markers include estrogen receptor negative, progesterone receptor negative, HER-2/neu negative and BRCA -2 positive. The patient was referred by a specialty consultant (Dr. Marko Sexton). Initial presentation was 1 month(s) ago for an abnormal mammogram (and MRI). Treatment has included modified radical mastectomy, radiation therapy and chemotherapy (for T2N2 breast cancer in 1996). She saw Dr. Harlow Sexton and wants to undergo lat flap with implant for reconstruction. We reviewed this in conference and felt that she had a reasonable low risk for needing radiation. Other Problems Heidi Sexton, [place]; 11/01/2013 11:00 AM) Breast Cancer Migraine Headache  Past Surgical History Heidi Sexton, [place]; 11/01/2013 11:00 AM) Breast Reconstruction Left. Hysterectomy (not due to cancer) - Complete Mastectomy Left.  Diagnostic Studies History Heidi Sexton, [place]; 11/01/2013 11:00 AM) Colonoscopy never Mammogram within last year Pap Smear 1-5 years ago  Social History Heidi Sexton, [place]; 11/01/2013 11:00 AM) Alcohol use Occasional alcohol use. Caffeine use Carbonated beverages, Coffee, Tea. Illicit drug use Recently quit drug use. Tobacco use Former smoker.  Family History Heidi Sexton, [place]; 11/01/2013 11:00 AM) Hypertension Heidi Sexton.  Pregnancy / Birth History Heidi Sexton, [place]; 11/01/2013 11:00 AM) Age at menarche 22 years. Age of menopause 70-50 Gravida 2 Irregular  periods Maternal age 9-35 Para 1     Review of Systems Heidi Sexton [place]; 11/01/2013 11:00 AM) General Not Present- Appetite Loss, Chills, Fatigue, Fever, Night Sweats, Weight Gain and Weight Loss. Skin Not Present- Change in Wart/Mole, Dryness, Hives, Jaundice, New Lesions, Non-Healing Wounds, Rash and Ulcer. HEENT Present- Seasonal Allergies and Wears glasses/contact lenses. Not Present- Earache, Hearing Loss, Hoarseness, Nose Bleed, Oral Ulcers, Ringing in the Ears, Sinus Pain, Sore Throat, Visual Disturbances and Yellow Eyes. Respiratory Not Present- Bloody sputum, Chronic Cough, Difficulty Breathing, Snoring and Wheezing. Cardiovascular Not Present- Chest Pain, Difficulty Breathing Lying Down, Leg Cramps, Palpitations, Rapid Heart Rate, Shortness of Breath and Swelling of Extremities. Gastrointestinal Not Present- Abdominal Pain, Bloating, Bloody Stool, Change in Bowel Habits, Chronic diarrhea, Constipation, Difficulty Swallowing, Excessive gas, Gets full quickly at meals, Hemorrhoids, Indigestion, Nausea, Rectal Pain and Vomiting. Female Genitourinary Not Present- Frequency, Nocturia, Painful Urination, Pelvic Pain and Urgency. Musculoskeletal Not Present- Back Pain, Joint Pain, Joint Stiffness, Muscle Pain, Muscle Weakness and Swelling of Extremities. Neurological Present- Headaches. Not Present- Decreased Memory, Fainting, Numbness, Seizures, Tingling, Tremor, Trouble walking and Weakness. Psychiatric Not Present- Anxiety, Bipolar, Change in Sleep Pattern, Depression, Fearful and Frequent crying. Endocrine Present- Hot flashes. Not Present- Cold Intolerance, Excessive Hunger, Hair Changes, Heat Intolerance and New Diabetes. Hematology Not Present- Easy Bruising, Excessive bleeding, Gland problems, HIV and Persistent Infections.  Vitals Heidi Costa MA; 11/01/2013 10:52 AM) 11/01/2013 10:51 AM Weight: 212.8 lb Height: 67.5in Body Surface Area: 2.14 m Body Mass Index: 32.84  kg/m Temp.: 97.104F(Temporal)  Pulse: 71 (Regular)  Resp.: 16 (Unlabored)  BP: 118/84 (Sitting, Left Arm, Standard)     Physical Exam Heidi Klein MD; 11/01/2013 11:44 AM)  General Mental Status-Alert. General Appearance-Consistent with stated age. Hydration-Well hydrated. Voice-Normal.  Eye Eyeball - Bilateral-Extraocular  movements intact. Sclera/Conjunctiva - Bilateral-No scleral icterus.  Chest and Lung Exam Note: no change in exam. no palpable mass     Assessment & Plan Heidi Klein MD; 11/01/2013 11:45 AM)  PRIMARY CANCER OF UPPER OUTER QUADRANT OF RIGHT FEMALE BREAST (174.4  C50.411) Impression: Plan is right mastectomy with sentinel lymph node biopsy and reconstruction with Dr. Harlow Sexton.  We reviewed risks and benefits. We discussed that Dr. Harlow Sexton would manage drains. We have orders written.  15 min spent in counseling, total visit 20 min.  Current Plans Pt Education - CCS Mastectomy HCI Schedule for Surgery

## 2013-11-05 ENCOUNTER — Other Ambulatory Visit: Payer: Self-pay | Admitting: Plastic Surgery

## 2013-11-11 ENCOUNTER — Encounter (HOSPITAL_COMMUNITY): Payer: Self-pay

## 2013-11-11 ENCOUNTER — Encounter (HOSPITAL_COMMUNITY): Payer: Self-pay | Admitting: Pharmacy Technician

## 2013-11-11 ENCOUNTER — Encounter (HOSPITAL_COMMUNITY)
Admission: RE | Admit: 2013-11-11 | Discharge: 2013-11-11 | Disposition: A | Discharge status: Home / Self Care | Payer: 59 | Source: Ambulatory Visit | Attending: General Surgery | Admitting: General Surgery

## 2013-11-11 DIAGNOSIS — Z01812 Encounter for preprocedural laboratory examination: Secondary | ICD-10-CM | POA: Insufficient documentation

## 2013-11-11 DIAGNOSIS — Z1501 Genetic susceptibility to malignant neoplasm of breast: Secondary | ICD-10-CM | POA: Insufficient documentation

## 2013-11-11 DIAGNOSIS — C50919 Malignant neoplasm of unspecified site of unspecified female breast: Secondary | ICD-10-CM | POA: Insufficient documentation

## 2013-11-11 HISTORY — DX: Anxiety disorder, unspecified: F41.9

## 2013-11-11 HISTORY — DX: Nausea with vomiting, unspecified: R11.2

## 2013-11-11 HISTORY — DX: Other specified postprocedural states: Z98.890

## 2013-11-11 HISTORY — DX: Cardiac murmur, unspecified: R01.1

## 2013-11-11 LAB — BASIC METABOLIC PANEL
ANION GAP: 16 — AB (ref 5–15)
BUN: 16 mg/dL (ref 6–23)
CHLORIDE: 104 meq/L (ref 96–112)
CO2: 23 meq/L (ref 19–32)
Calcium: 9.1 mg/dL (ref 8.4–10.5)
Creatinine, Ser: 0.66 mg/dL (ref 0.50–1.10)
GFR calc Af Amer: >90 mL/min (ref >90)
GFR calc non Af Amer: >90 mL/min (ref >90)
Glucose, Bld: 93 mg/dL (ref 70–99)
Potassium: 4.4 mEq/L (ref 3.7–5.3)
Sodium: 143 mEq/L (ref 137–147)

## 2013-11-11 LAB — CBC
HCT: 34.8 % — ABNORMAL LOW (ref 36.0–46.0)
HEMOGLOBIN: 11.5 g/dL — AB (ref 12.0–15.0)
MCH: 28.1 pg (ref 26.0–34.0)
MCHC: 33 g/dL (ref 30.0–36.0)
MCV: 85.1 fL (ref 78.0–100.0)
Platelets: 276 10*3/uL (ref 150–400)
RBC: 4.09 MIL/uL (ref 3.87–5.11)
RDW: 14 % (ref 11.5–15.5)
WBC: 4.6 10*3/uL (ref 4.0–10.5)

## 2013-11-11 NOTE — Pre-Procedure Instructions (Signed)
JANISHA BUESO  11/11/2013   Your procedure is scheduled on:  11/16/2013  Report to [facility] Admitting   ENTRANCE A at  5:30 AM.  Call this number if you have problems the morning of surgery: [phone]   Remember:    Do not eat food or drink liquids after midnight.  On Monday NIGHT   Take these medicines the morning of surgery with A SIP OF WATER:   NONE   Do not wear jewelry, make-up or nail polish.  Do not wear lotions, powders, or perfumes. You may wear deodorant.  Do not shave 48 hours prior to surgery. Men may shave face and neck.  Do not bring valuables to the hospital.  [facility] is not responsible                  for any belongings or valuables.               Contacts, dentures or bridgework may not be worn into surgery.  Leave suitcase in the car. After surgery it may be brought to your room.  For patients admitted to the hospital, discharge time is determined by your                treatment team.               Patients discharged the day of surgery will not be allowed to drive  home.  Name and phone number of your driver: /w FAMILY  Special Instructions: Special Instructions: [facility] - Preparing for Surgery  Before surgery, you can play an important role.  Because skin is not sterile, your skin needs to be as free of germs as possible.  You can reduce the number of germs on you skin by washing with CHG (chlorahexidine gluconate) soap before surgery.  CHG is an antiseptic cleaner which kills germs and bonds with the skin to continue killing germs even after washing.  Please DO NOT use if you have an allergy to CHG or antibacterial soaps.  If your skin becomes reddened/irritated stop using the CHG and inform your nurse when you arrive at Short Stay.  Do not shave (including legs and underarms) for at least 48 hours prior to the first CHG shower.  You may shave your face.  Please follow these instructions carefully:   1.  Shower with CHG Soap the  night before surgery and the  morning of Surgery.  2.  If you choose to wash your hair, wash your hair first as usual with your  normal shampoo.  3.  After you shampoo, rinse your hair and body thoroughly to remove the  Shampoo.  4.  Use CHG as you would any other liquid soap.  You can apply chg directly to the skin and wash gently with scrungie or a clean washcloth.  5.  Apply the CHG Soap to your body ONLY FROM THE NECK DOWN.    Do not use on open wounds or open sores.  Avoid contact with your eyes, ears, mouth and genitals (private parts).  Wash genitals (private parts)   with your normal soap.  6.  Wash thoroughly, paying special attention to the area where your surgery will be performed.  7.  Thoroughly rinse your body with warm water from the neck down.  8.  DO NOT shower/wash with your normal soap after using and rinsing off   the CHG Soap.  9.  Pat yourself dry with a clean  towel.            10.  Wear clean pajamas.            11.  Place clean sheets on your bed the night of your first shower and do not sleep with pets.  Day of Surgery  Do not apply any lotions/deodorants the morning of surgery.  Please wear clean clothes to the hospital/surgery center.   Please read over the following fact sheets that you were given: Pain Booklet, Coughing and Deep Breathing and Surgical Site Infection Prevention

## 2013-11-15 MED ORDER — CEFAZOLIN SODIUM-DEXTROSE 2-3 GM-% IV SOLR: 2.0000 g | INTRAVENOUS | Status: DC

## 2013-11-15 MED ORDER — HEPARIN SODIUM (PORCINE) 5000 UNIT/ML IJ SOLN
5000.0000 [IU] | Freq: Once | INTRAMUSCULAR | Status: AC
  Administered 2013-11-16: 5000 [IU] via SUBCUTANEOUS
  Filled 2013-11-15: qty 1

## 2013-11-15 MED ORDER — CEFAZOLIN SODIUM-DEXTROSE 2-3 GM-% IV SOLR
2.0000 g | INTRAVENOUS | Status: AC
  Administered 2013-11-16 (×2): 2 g via INTRAVENOUS
  Filled 2013-11-15: qty 50

## 2013-11-15 MED ORDER — CHLORHEXIDINE GLUCONATE 4 % EX LIQD
1.0000 "application " | Freq: Once | CUTANEOUS | Status: DC
  Filled 2013-11-15: qty 15

## 2013-11-16 ENCOUNTER — Encounter (HOSPITAL_COMMUNITY): Payer: 59 | Admitting: Anesthesiology

## 2013-11-16 ENCOUNTER — Inpatient Hospital Stay (HOSPITAL_COMMUNITY)
Admission: RE | Admit: 2013-11-16 | Discharge: 2013-11-18 | DRG: 581 | Disposition: A | Discharge status: Home / Self Care | Payer: 59 | Source: Ambulatory Visit | Attending: Plastic Surgery | Admitting: Plastic Surgery

## 2013-11-16 ENCOUNTER — Ambulatory Visit (HOSPITAL_COMMUNITY): Payer: 59 | Admitting: Anesthesiology

## 2013-11-16 ENCOUNTER — Encounter (HOSPITAL_COMMUNITY)
Admission: RE | Admit: 2013-11-16 | Discharge: 2013-11-16 | Disposition: A | Discharge status: Home / Self Care | Payer: 59 | Source: Ambulatory Visit | Attending: General Surgery | Admitting: General Surgery

## 2013-11-16 ENCOUNTER — Encounter (HOSPITAL_COMMUNITY): Payer: Self-pay | Admitting: *Deleted

## 2013-11-16 ENCOUNTER — Encounter (HOSPITAL_COMMUNITY)
Admission: RE | Disposition: A | Discharge status: Home / Self Care | Payer: Self-pay | Source: Ambulatory Visit | Attending: Plastic Surgery

## 2013-11-16 ENCOUNTER — Ambulatory Visit (HOSPITAL_COMMUNITY): Payer: 59

## 2013-11-16 DIAGNOSIS — Z87891 Personal history of nicotine dependence: Secondary | ICD-10-CM | POA: Diagnosis not present

## 2013-11-16 DIAGNOSIS — Z853 Personal history of malignant neoplasm of breast: Secondary | ICD-10-CM

## 2013-11-16 DIAGNOSIS — Z79899 Other long term (current) drug therapy: Secondary | ICD-10-CM | POA: Diagnosis not present

## 2013-11-16 DIAGNOSIS — C50411 Malignant neoplasm of upper-outer quadrant of right female breast: Secondary | ICD-10-CM | POA: Diagnosis present

## 2013-11-16 DIAGNOSIS — C50911 Malignant neoplasm of unspecified site of right female breast: Secondary | ICD-10-CM

## 2013-11-16 DIAGNOSIS — C50919 Malignant neoplasm of unspecified site of unspecified female breast: Secondary | ICD-10-CM | POA: Diagnosis present

## 2013-11-16 HISTORY — PX: MASTECTOMY W/ SENTINEL NODE BIOPSY: SHX2001

## 2013-11-16 HISTORY — PX: LATISSIMUS FLAP TO BREAST: SHX5357

## 2013-11-16 HISTORY — PX: BREAST RECONSTRUCTION: SHX9

## 2013-11-16 HISTORY — PX: MASTECTOMY COMPLETE / SIMPLE W/ SENTINEL NODE BIOPSY: SUR846

## 2013-11-16 HISTORY — DX: Migraine, unspecified, not intractable, without status migrainosus: G43.909

## 2013-11-16 HISTORY — PX: RECONSTRUCTION BREAST W/ LATISSIMUS DORSI FLAP: SUR1078

## 2013-11-16 HISTORY — DX: Family history of other specified conditions: Z84.89

## 2013-11-16 SURGERY — MASTECTOMY WITH SENTINEL LYMPH NODE BIOPSY
Anesthesia: General | Site: Breast | Laterality: Right

## 2013-11-16 MED ORDER — ONDANSETRON HCL 4 MG/2ML IJ SOLN
4.0000 mg | Freq: Four times a day (QID) | INTRAMUSCULAR | Status: DC | PRN
  Administered 2013-11-16: 4 mg via INTRAVENOUS
  Filled 2013-11-16: qty 2

## 2013-11-16 MED ORDER — PROPOFOL 10 MG/ML IV BOLUS
INTRAVENOUS | Status: AC
  Filled 2013-11-16: qty 20

## 2013-11-16 MED ORDER — PROMETHAZINE HCL 25 MG/ML IJ SOLN
INTRAMUSCULAR | Status: AC
  Filled 2013-11-16: qty 1

## 2013-11-16 MED ORDER — PROPOFOL 10 MG/ML IV BOLUS
INTRAVENOUS | Status: DC | PRN
  Administered 2013-11-16: 130 mg via INTRAVENOUS

## 2013-11-16 MED ORDER — DIPHENHYDRAMINE HCL 12.5 MG/5ML PO ELIX: 12.5000 mg | ORAL_SOLUTION | Freq: Four times a day (QID) | ORAL | Status: DC | PRN

## 2013-11-16 MED ORDER — SCOPOLAMINE 1 MG/3DAYS TD PT72
MEDICATED_PATCH | TRANSDERMAL | Status: DC | PRN
  Administered 2013-11-16: 1 via TRANSDERMAL

## 2013-11-16 MED ORDER — SODIUM CHLORIDE 0.9 % IJ SOLN
INTRAMUSCULAR | Status: DC | PRN
  Administered 2013-11-16: 08:00:00

## 2013-11-16 MED ORDER — FENTANYL CITRATE 0.05 MG/ML IJ SOLN
INTRAMUSCULAR | Status: AC
  Filled 2013-11-16: qty 5

## 2013-11-16 MED ORDER — GLYCOPYRROLATE 0.2 MG/ML IJ SOLN
INTRAMUSCULAR | Status: DC | PRN
  Administered 2013-11-16: 0.4 mg via INTRAVENOUS

## 2013-11-16 MED ORDER — METHOCARBAMOL 500 MG PO TABS
500.0000 mg | ORAL_TABLET | Freq: Four times a day (QID) | ORAL | Status: DC | PRN
Start: 2013-11-16 — End: 2013-11-17
  Administered 2013-11-16: 500 mg via ORAL
  Filled 2013-11-16: qty 1

## 2013-11-16 MED ORDER — HEPARIN SODIUM (PORCINE) 5000 UNIT/ML IJ SOLN
5000.0000 [IU] | Freq: Three times a day (TID) | INTRAMUSCULAR | Status: DC
  Administered 2013-11-17 – 2013-11-18 (×4): 5000 [IU] via SUBCUTANEOUS
  Filled 2013-11-16 (×8): qty 1

## 2013-11-16 MED ORDER — ROCURONIUM BROMIDE 50 MG/5ML IV SOLN
INTRAVENOUS | Status: AC
  Filled 2013-11-16: qty 1

## 2013-11-16 MED ORDER — HYDROMORPHONE 0.3 MG/ML IV SOLN
INTRAVENOUS | Status: DC
  Administered 2013-11-16: 0.599 mg via INTRAVENOUS
  Administered 2013-11-16: 0.99 mg via INTRAVENOUS
  Administered 2013-11-16: 0.2 mg via INTRAVENOUS
  Administered 2013-11-16: 1.39 mg via INTRAVENOUS
  Administered 2013-11-17: 0.999 mg via INTRAVENOUS

## 2013-11-16 MED ORDER — DOCUSATE SODIUM 100 MG PO CAPS
100.0000 mg | ORAL_CAPSULE | Freq: Every day | ORAL | Status: DC
  Administered 2013-11-17 – 2013-11-18 (×2): 100 mg via ORAL
  Filled 2013-11-16: qty 1

## 2013-11-16 MED ORDER — TECHNETIUM TC 99M SULFUR COLLOID FILTERED
1.0000 | Freq: Once | INTRAVENOUS | Status: AC | PRN
  Administered 2013-11-16: 1 via INTRADERMAL

## 2013-11-16 MED ORDER — METHYLENE BLUE 1 % INJ SOLN
INTRAMUSCULAR | Status: AC
  Filled 2013-11-16: qty 10

## 2013-11-16 MED ORDER — OXYCODONE HCL 5 MG/5ML PO SOLN: 5.0000 mg | Freq: Once | ORAL | Status: DC | PRN

## 2013-11-16 MED ORDER — BUPIVACAINE-EPINEPHRINE (PF) 0.5% -1:200000 IJ SOLN
INTRAMUSCULAR | Status: AC
  Filled 2013-11-16: qty 30

## 2013-11-16 MED ORDER — CEFAZOLIN SODIUM 1-5 GM-% IV SOLN
1.0000 g | Freq: Four times a day (QID) | INTRAVENOUS | Status: AC
  Administered 2013-11-16 – 2013-11-17 (×4): 1 g via INTRAVENOUS
  Filled 2013-11-16 (×4): qty 50

## 2013-11-16 MED ORDER — DEXAMETHASONE SODIUM PHOSPHATE 10 MG/ML IJ SOLN
INTRAMUSCULAR | Status: DC | PRN
  Administered 2013-11-16: 8 mg via INTRAVENOUS

## 2013-11-16 MED ORDER — ROCURONIUM BROMIDE 50 MG/5ML IV SOLN
INTRAVENOUS | Status: AC
Start: 2013-11-16 — End: 2013-11-16
  Filled 2013-11-16: qty 1

## 2013-11-16 MED ORDER — ONDANSETRON HCL 4 MG/2ML IJ SOLN
INTRAMUSCULAR | Status: DC | PRN
  Administered 2013-11-16: 4 mg via INTRAVENOUS

## 2013-11-16 MED ORDER — SODIUM CHLORIDE 0.9 % IJ SOLN: 9.0000 mL | INTRAMUSCULAR | Status: DC | PRN

## 2013-11-16 MED ORDER — DEXTROSE-NACL 5-0.45 % IV SOLN
INTRAVENOUS | Status: DC
  Administered 2013-11-16: 125 mL/h via INTRAVENOUS
  Administered 2013-11-17: 01:00:00 via INTRAVENOUS

## 2013-11-16 MED ORDER — FENTANYL CITRATE 0.05 MG/ML IJ SOLN
INTRAMUSCULAR | Status: AC
Start: 2013-11-16 — End: 2013-11-16
  Filled 2013-11-16: qty 5

## 2013-11-16 MED ORDER — FENTANYL CITRATE 0.05 MG/ML IJ SOLN
INTRAMUSCULAR | Status: DC | PRN
  Administered 2013-11-16: 50 ug via INTRAVENOUS
  Administered 2013-11-16 (×3): 100 ug via INTRAVENOUS
  Administered 2013-11-16: 50 ug via INTRAVENOUS
  Administered 2013-11-16 (×4): 100 ug via INTRAVENOUS
  Administered 2013-11-16 (×3): 50 ug via INTRAVENOUS
  Administered 2013-11-16 (×2): 100 ug via INTRAVENOUS

## 2013-11-16 MED ORDER — MIDAZOLAM HCL 2 MG/2ML IJ SOLN
INTRAMUSCULAR | Status: AC
  Filled 2013-11-16: qty 2

## 2013-11-16 MED ORDER — HYDROMORPHONE HCL 1 MG/ML IJ SOLN
INTRAMUSCULAR | Status: AC
  Filled 2013-11-16: qty 1

## 2013-11-16 MED ORDER — LIDOCAINE HCL (CARDIAC) 20 MG/ML IV SOLN
INTRAVENOUS | Status: AC
  Filled 2013-11-16: qty 5

## 2013-11-16 MED ORDER — NALOXONE HCL 0.4 MG/ML IJ SOLN: 0.4000 mg | INTRAMUSCULAR | Status: DC | PRN

## 2013-11-16 MED ORDER — SODIUM CHLORIDE 0.9 % IV SOLN
INTRAVENOUS | Status: AC
  Administered 2013-11-16: 10:00:00
  Filled 2013-11-16: qty 1

## 2013-11-16 MED ORDER — MIDAZOLAM HCL 5 MG/5ML IJ SOLN
INTRAMUSCULAR | Status: DC | PRN
  Administered 2013-11-16: 2 mg via INTRAVENOUS

## 2013-11-16 MED ORDER — LACTATED RINGERS IV SOLN
INTRAVENOUS | Status: DC | PRN
  Administered 2013-11-16 (×3): via INTRAVENOUS

## 2013-11-16 MED ORDER — PROMETHAZINE HCL 25 MG/ML IJ SOLN
6.2500 mg | INTRAMUSCULAR | Status: DC | PRN
  Administered 2013-11-16 – 2013-11-17 (×2): 6.25 mg via INTRAVENOUS
  Filled 2013-11-16: qty 1

## 2013-11-16 MED ORDER — HYDROMORPHONE 0.3 MG/ML IV SOLN
INTRAVENOUS | Status: AC
  Filled 2013-11-16: qty 25

## 2013-11-16 MED ORDER — SODIUM CHLORIDE 0.9 % IJ SOLN
INTRAMUSCULAR | Status: DC | PRN
  Administered 2013-11-16: 60 mL

## 2013-11-16 MED ORDER — SCOPOLAMINE 1 MG/3DAYS TD PT72
1.0000 | MEDICATED_PATCH | TRANSDERMAL | Status: DC
  Filled 2013-11-16: qty 1

## 2013-11-16 MED ORDER — ROCURONIUM BROMIDE 100 MG/10ML IV SOLN
INTRAVENOUS | Status: DC | PRN
  Administered 2013-11-16 (×2): 50 mg via INTRAVENOUS
  Administered 2013-11-16: 20 mg via INTRAVENOUS

## 2013-11-16 MED ORDER — SODIUM CHLORIDE 0.9 % IR SOLN
Status: DC | PRN
  Administered 2013-11-16: 1000 mL

## 2013-11-16 MED ORDER — HYDROMORPHONE HCL 1 MG/ML IJ SOLN
0.2500 mg | INTRAMUSCULAR | Status: DC | PRN
  Administered 2013-11-16 (×4): 0.5 mg via INTRAVENOUS

## 2013-11-16 MED ORDER — DIPHENHYDRAMINE HCL 50 MG/ML IJ SOLN: 12.5000 mg | Freq: Four times a day (QID) | INTRAMUSCULAR | Status: DC | PRN

## 2013-11-16 MED ORDER — SODIUM CHLORIDE 0.9 % IJ SOLN
INTRAMUSCULAR | Status: AC
  Filled 2013-11-16: qty 10

## 2013-11-16 MED ORDER — ONDANSETRON HCL 4 MG/2ML IJ SOLN
INTRAMUSCULAR | Status: AC
  Filled 2013-11-16: qty 2

## 2013-11-16 MED ORDER — BUPIVACAINE-EPINEPHRINE 0.5% -1:200000 IJ SOLN
INTRAMUSCULAR | Status: DC | PRN
  Administered 2013-11-16: 40 mL

## 2013-11-16 MED ORDER — CEFAZOLIN SODIUM-DEXTROSE 2-3 GM-% IV SOLR
INTRAVENOUS | Status: AC
  Filled 2013-11-16: qty 50

## 2013-11-16 MED ORDER — 0.9 % SODIUM CHLORIDE (POUR BTL) OPTIME
TOPICAL | Status: DC | PRN
  Administered 2013-11-16 (×5): 1000 mL

## 2013-11-16 MED ORDER — PROMETHAZINE HCL 25 MG/ML IJ SOLN: 6.2500 mg | INTRAMUSCULAR | Status: DC | PRN

## 2013-11-16 MED ORDER — OXYCODONE HCL 5 MG PO TABS: 5.0000 mg | ORAL_TABLET | Freq: Once | ORAL | Status: DC | PRN

## 2013-11-16 MED ORDER — DEXAMETHASONE SODIUM PHOSPHATE 4 MG/ML IJ SOLN
INTRAMUSCULAR | Status: AC
  Filled 2013-11-16: qty 2

## 2013-11-16 MED ORDER — NEOSTIGMINE METHYLSULFATE 10 MG/10ML IV SOLN
INTRAVENOUS | Status: DC | PRN
  Administered 2013-11-16: 3 mg via INTRAVENOUS

## 2013-11-16 SURGICAL SUPPLY — 110 items
APPLIER CLIP 9.375 MED OPEN (MISCELLANEOUS) ×4
ATCH SMKEVC FLXB CAUT HNDSWH (FILTER) ×1 IMPLANT
BAG DECANTER FOR FLEXI CONT (MISCELLANEOUS) ×2 IMPLANT
BINDER BREAST LRG (GAUZE/BANDAGES/DRESSINGS) IMPLANT
BINDER BREAST XLRG (GAUZE/BANDAGES/DRESSINGS) IMPLANT
BINDER BREAST XXLRG (GAUZE/BANDAGES/DRESSINGS) ×2 IMPLANT
BIOPATCH RED 1 DISK 7.0 (GAUZE/BANDAGES/DRESSINGS) ×8 IMPLANT
BLADE SURG 10 STRL SS (BLADE) ×2 IMPLANT
BLADE SURG 15 STRL LF DISP TIS (BLADE) ×1 IMPLANT
BLADE SURG 15 STRL SS (BLADE) ×1
BNDG COHESIVE 4X5 TAN STRL (GAUZE/BANDAGES/DRESSINGS) IMPLANT
CANISTER SUCTION 2500CC (MISCELLANEOUS) ×6 IMPLANT
CHLORAPREP W/TINT 26ML (MISCELLANEOUS) ×4 IMPLANT
CLIP APPLIE 9.375 MED OPEN (MISCELLANEOUS) ×2 IMPLANT
CLIP TI MEDIUM 6 (CLIP) ×2 IMPLANT
CLIP TI WIDE RED SMALL 6 (CLIP) ×2 IMPLANT
CONT SPEC 4OZ CLIKSEAL STRL BL (MISCELLANEOUS) ×2 IMPLANT
COVER SURGICAL LIGHT HANDLE (MISCELLANEOUS) ×4 IMPLANT
COVER TRANSDUCER ULTRASND GEL (DRAPE) ×2 IMPLANT
DERMABOND ADVANCED (GAUZE/BANDAGES/DRESSINGS) ×3
DERMABOND ADVANCED .7 DNX12 (GAUZE/BANDAGES/DRESSINGS) ×3 IMPLANT
DEVICE DISSECT PLASMABLAD 3.0S (MISCELLANEOUS) ×1 IMPLANT
DRAIN CHANNEL 19F RND (DRAIN) ×8 IMPLANT
DRAPE INCISE 23X17 IOBAN STRL (DRAPES) ×1
DRAPE INCISE IOBAN 23X17 STRL (DRAPES) ×1 IMPLANT
DRAPE ORTHO SPLIT 77X108 STRL (DRAPES) ×4
DRAPE PROXIMA HALF (DRAPES) ×6 IMPLANT
DRAPE SURG 17X23 STRL (DRAPES) ×8 IMPLANT
DRAPE SURG ORHT 6 SPLT 77X108 (DRAPES) ×4 IMPLANT
DRAPE UTILITY 15X26 W/TAPE STR (DRAPE) ×6 IMPLANT
DRAPE WARM FLUID 44X44 (DRAPE) ×2 IMPLANT
DRSG PAD ABDOMINAL 8X10 ST (GAUZE/BANDAGES/DRESSINGS) ×6 IMPLANT
DRSG SORBAVIEW 3.5X5-5/16 MED (GAUZE/BANDAGES/DRESSINGS) IMPLANT
DRSG TEGADERM 4X4.75 (GAUZE/BANDAGES/DRESSINGS) ×4 IMPLANT
DRSG TELFA 3X8 NADH (GAUZE/BANDAGES/DRESSINGS) IMPLANT
ELECT BLADE 6.5 EXT (BLADE) ×4 IMPLANT
ELECT CAUTERY BLADE 6.4 (BLADE) ×6 IMPLANT
ELECT REM PT RETURN 9FT ADLT (ELECTROSURGICAL) ×4
ELECTRODE REM PT RTRN 9FT ADLT (ELECTROSURGICAL) ×2 IMPLANT
EVACUATOR SILICONE 100CC (DRAIN) ×8 IMPLANT
EVACUATOR SMOKE ACCUVAC VALLEY (FILTER) ×1
GAUZE SPONGE 4X4 12PLY STRL (GAUZE/BANDAGES/DRESSINGS) IMPLANT
GAUZE XEROFORM 5X9 LF (GAUZE/BANDAGES/DRESSINGS) IMPLANT
GLOVE BIO SURGEON STRL SZ 6 (GLOVE) ×2 IMPLANT
GLOVE BIO SURGEON STRL SZ7 (GLOVE) ×2 IMPLANT
GLOVE BIO SURGEON STRL SZ7.5 (GLOVE) ×4 IMPLANT
GLOVE BIOGEL PI IND STRL 6.5 (GLOVE) ×1 IMPLANT
GLOVE BIOGEL PI IND STRL 7.0 (GLOVE) ×2 IMPLANT
GLOVE BIOGEL PI IND STRL 8 (GLOVE) ×2 IMPLANT
GLOVE BIOGEL PI INDICATOR 6.5 (GLOVE) ×1
GLOVE BIOGEL PI INDICATOR 7.0 (GLOVE) ×2
GLOVE BIOGEL PI INDICATOR 8 (GLOVE) ×2
GLOVE EUDERMIC 7 POWDERFREE (GLOVE) ×2 IMPLANT
GLOVE SURG SS PI 6.5 STRL IVOR (GLOVE) ×2 IMPLANT
GLOVE SURG SS PI 7.0 STRL IVOR (GLOVE) ×4 IMPLANT
GOWN STRL REUS W/ TWL LRG LVL3 (GOWN DISPOSABLE) ×4 IMPLANT
GOWN STRL REUS W/ TWL XL LVL3 (GOWN DISPOSABLE) ×1 IMPLANT
GOWN STRL REUS W/TWL 2XL LVL3 (GOWN DISPOSABLE) ×2 IMPLANT
GOWN STRL REUS W/TWL LRG LVL3 (GOWN DISPOSABLE) ×4
GOWN STRL REUS W/TWL XL LVL3 (GOWN DISPOSABLE) ×1
IMPL SALINE MOD 475CC (Breast) ×1 IMPLANT
IMPLANT SALINE MOD 475CC (Breast) ×2 IMPLANT
KIT BASIN OR (CUSTOM PROCEDURE TRAY) ×4 IMPLANT
KIT ROOM TURNOVER OR (KITS) ×4 IMPLANT
MARKER SKIN DUAL TIP RULER LAB (MISCELLANEOUS) ×4 IMPLANT
MENTOR ROUND MODERATE PROFILE SINGLE USE SALINE BREAST IMPLANT SIZER 425CC +50; REF: 351425SZ ×2 IMPLANT
NEEDLE 18GX1X1/2 (RX/OR ONLY) (NEEDLE) ×2 IMPLANT
NEEDLE 21 GA WING INFUSION (NEEDLE) ×2 IMPLANT
NEEDLE HYPO 25GX1X1/2 BEV (NEEDLE) ×2 IMPLANT
NEEDLE SPNL 22GX3.5 QUINCKE BK (NEEDLE) ×2 IMPLANT
NS IRRIG 1000ML POUR BTL (IV SOLUTION) ×6 IMPLANT
PACK GENERAL/GYN (CUSTOM PROCEDURE TRAY) ×4 IMPLANT
PACK UNIVERSAL I (CUSTOM PROCEDURE TRAY) ×2 IMPLANT
PAD ARMBOARD 7.5X6 YLW CONV (MISCELLANEOUS) ×8 IMPLANT
PENCIL BUTTON HOLSTER BLD 10FT (ELECTRODE) ×2 IMPLANT
PLASMABLADE 3.0S (MISCELLANEOUS) ×2
PREFILTER EVAC NS 1 1/3-3/8IN (MISCELLANEOUS) ×2 IMPLANT
SET ASEPTIC TRANSFER (MISCELLANEOUS) ×2 IMPLANT
SIZER BREAST MP 475CC (SIZER) ×4
SIZER BRST MP 475CC (SIZER) ×2 IMPLANT
SPECIMEN JAR X LARGE (MISCELLANEOUS) ×2 IMPLANT
SPONGE GAUZE 4X4 12PLY STER LF (GAUZE/BANDAGES/DRESSINGS) IMPLANT
SPONGE LAP 18X18 X RAY DECT (DISPOSABLE) ×4 IMPLANT
STAPLER VISISTAT 35W (STAPLE) ×4 IMPLANT
STOCKINETTE IMPERVIOUS 9X36 MD (GAUZE/BANDAGES/DRESSINGS) ×2 IMPLANT
STRIP CLOSURE SKIN 1/2X4 (GAUZE/BANDAGES/DRESSINGS) IMPLANT
SUT ETHILON 2 0 FS 18 (SUTURE) IMPLANT
SUT MNCRL AB 3-0 PS2 18 (SUTURE) ×12 IMPLANT
SUT MON AB 2-0 CT1 36 (SUTURE) ×4 IMPLANT
SUT MON AB 4-0 PC3 18 (SUTURE) ×2 IMPLANT
SUT PDS AB 0 CT 36 (SUTURE) ×4 IMPLANT
SUT PDS AB 3-0 SH 27 (SUTURE) IMPLANT
SUT PROLENE 3 0 PS 1 (SUTURE) ×4 IMPLANT
SUT PROLENE 3 0 PS 2 (SUTURE) ×4 IMPLANT
SUT SILK 2 0 (SUTURE) ×1
SUT SILK 2 0 FS (SUTURE) ×2 IMPLANT
SUT SILK 2-0 18XBRD TIE 12 (SUTURE) ×1 IMPLANT
SUT VIC AB 3-0 FS2 27 (SUTURE) IMPLANT
SUT VIC AB 3-0 SH 18 (SUTURE) ×2 IMPLANT
SUT VIC AB 3-0 SH 8-18 (SUTURE) ×4 IMPLANT
SYR 50ML LL SCALE MARK (SYRINGE) ×2 IMPLANT
SYR 50ML SLIP (SYRINGE) IMPLANT
SYR BULB IRRIGATION 50ML (SYRINGE) ×8 IMPLANT
SYR CONTROL 10ML LL (SYRINGE) ×2 IMPLANT
TAPE CLOTH SURG 4X10 WHT LF (GAUZE/BANDAGES/DRESSINGS) ×2 IMPLANT
TOWEL OR 17X24 6PK STRL BLUE (TOWEL DISPOSABLE) ×4 IMPLANT
TOWEL OR 17X26 10 PK STRL BLUE (TOWEL DISPOSABLE) ×4 IMPLANT
TRAY FOLEY CATH 14FRSI W/METER (CATHETERS) ×2 IMPLANT
TRAY FOLEY CATH 16FRSI W/METER (SET/KITS/TRAYS/PACK) IMPLANT
TUBE CONNECTING 12X1/4 (SUCTIONS) ×6 IMPLANT

## 2013-11-16 NOTE — Brief Op Note (Signed)
11/16/2013  2:56 PM  PATIENT:  Heidi Sexton  53 y.o. female  PRE-OPERATIVE DIAGNOSIS:  RIGHT BREAST CANCER  POST-OPERATIVE DIAGNOSIS:  RIGHT BREAST CANCER  PROCEDURE:  Procedure(s): RIGHT MASTECTOMY WITH SENTINEL LYMPH NODE BIOPSY (Right) LATISSIMUS FLAP TO BREAST WITH SALINE IMPLANTS (Right) BREAST RECONSTRUCTION (Right)  SURGEON:  Surgeon(s) and Role: Panel 1:    * [name], MD - Primary  Panel 2:    * Crissie Reese, MD - Primary  PHYSICIAN ASSISTANT:   ASSISTANTS: none   ANESTHESIA:   general  EBL:  Total I/O In: 2500 [I.V.:2500] Out: 505 [Urine:455; Blood:50]  BLOOD ADMINISTERED:none  DRAINS: (4) Jackson-Pratt drain(s) with closed bulb suction in the right back donor site (2) and right chest (2)   LOCAL MEDICATIONS USED:  NONE  SPECIMEN:  Source of Specimen:  portion of mastectomy skin flap  DISPOSITION OF SPECIMEN:  PATHOLOGY  COUNTS:  YES  TOURNIQUET:  * No tourniquets in log *  DICTATION: .Other Dictation: Dictation Number [id number]  PLAN OF CARE: Admit to inpatient   PATIENT DISPOSITION:  PACU - hemodynamically stable.   Delay start of Pharmacological VTE agent (>24hrs) due to surgical blood loss or risk of bleeding: no

## 2013-11-16 NOTE — Transfer of Care (Signed)
Immediate Anesthesia Transfer of Care Note  Patient: Heidi Sexton  Procedure(s) Performed: Procedure(s): RIGHT MASTECTOMY WITH SENTINEL LYMPH NODE BIOPSY (Right) LATISSIMUS FLAP TO BREAST WITH SALINE IMPLANTS (Right) BREAST RECONSTRUCTION (Right)  Patient Location: PACU  Anesthesia Type:General  Level of Consciousness: awake  Airway & Oxygen Therapy: Patient Spontanous Breathing and Patient connected to nasal cannula oxygen  Post-op Assessment: Report given to PACU RN and Post -op Vital signs reviewed and stable  Post vital signs: Reviewed and stable  Complications: No apparent anesthesia complications

## 2013-11-16 NOTE — Anesthesia Preprocedure Evaluation (Signed)
Anesthesia Evaluation  Patient identified by MRN, date of birth, ID band Patient awake    History of Anesthesia Complications (+) PONV  Airway       Dental   Pulmonary          Cardiovascular     Neuro/Psych    GI/Hepatic   Endo/Other    Renal/GU      Musculoskeletal   Abdominal   Peds  Hematology   Anesthesia Other Findings   Reproductive/Obstetrics                           Anesthesia Physical Anesthesia Plan  ASA: II  Anesthesia Plan: General   Post-op Pain Management:    Induction: Intravenous  Airway Management Planned: Oral ETT  Additional Equipment:   Intra-op Plan:   Post-operative Plan: Extubation in OR  Informed Consent: I have reviewed the patients History and Physical, chart, labs and discussed the procedure including the risks, benefits and alternatives for the proposed anesthesia with the patient or authorized representative who has indicated his/her understanding and acceptance.   Dental advisory given  Plan Discussed with: CRNA and Surgeon  Anesthesia Plan Comments:         Anesthesia Quick Evaluation

## 2013-11-16 NOTE — Op Note (Signed)
Right Mastectomy with Sentinel Node Biopsy Procedure Note  Indications: This patient presents with right breast cancer with clinically negative axillary lymph node exam, BRCA mutation, and history of left breast cancer.  Pre-operative Diagnosis: right breast cancer, cT1N0M0  Post-operative Diagnosis: right breast cancer, same  Surgeon: [name]   Anesthesia: General endotracheal anesthesia and Local anesthesia 0.25.% bupivacaine, with epinephrine  ASA Class: 2  Procedure Details  The patient was seen in the Holding Room. The risks, benefits, complications, treatment options, and expected outcomes were discussed with the patient. The possibilities of reaction to medication, pulmonary aspiration, bleeding, infection, the need for additional procedures, failure to diagnose a condition, and creating a complication requiring transfusion or operation were discussed with the patient. The patient concurred with the proposed plan, giving informed consent.  The site of surgery properly noted/marked. The patient was taken to Operating Room # 9, identified as Heidi Sexton and the procedure verified as Right Mastectomy and Sentinel Node Biopsy. A Time Out was held and the above information confirmed.  The methylene blue was injected in the subareolar location.    After induction of anesthesia, the right arm, breast, and chest were prepped and draped in standard fashion.   The borders of the breast were identified and marked. A skin sparing incision was drawn out.   The superior incision was made with the PlasmaBlade.  The marcaine/saline mixture was infiltrated into the superior flap.  Mastectomy hooks were used to provide elevation of the skin edges, and the PlasmaBlade was used to create the mastectomy flaps.  The dissection was taken to the fascia of the pectoralis major.  The penetrating vessels were clipped.  The superior flap was taken medially to the lateral sternal border, superiorly to the  inferior border of the clavicle.  The inferior flap was similarly created, inferiorly to the inframammary fold and laterally to the border of the latissimus.  The breast was taken off including the pectoralis fascia and the axillary tail marked.    Using a hand-held gamma probe, axillary sentinel nodes were identified.  3 level 2 axillary sentinel nodes were removed and submitted to pathology.  The findings are below.  The lymphovascular channels were clipped with metal clips. Additional tissue was taken laterally as the flap laterally was too thick.  This was oriented and passed off.         The wound was irrigated.   Hemostasis was achieved with cautery.  The wound was irrigated.  My lap count was performed.  Once this was correct, and all my laps were passed off, two of Dr. Sherilyn Dacosta laps were soaked in antibiotic irrigation and placed in the wound.  This was covered with a sterile occlusive dressing so that the drapes could be removed and the patient could be positioned laterally for a latissimus flap.    Instrument and needle counts were correct.    Findings: grossly clear surgical margins, SLN #1 palpable, SLN #2 cps 350, #3 cps 140  Estimated Blood Loss:  less than 50 mL         Drains: per Dr. Harlow Mares                Specimens: R breast, 3 axillary sentinel nodes, and additional right breast tissue representing true lateral margin.           Complications:  None; patient tolerated the procedure well.         Disposition: PACU - hemodynamically stable.  Condition: stable

## 2013-11-16 NOTE — Anesthesia Procedure Notes (Signed)
Procedures

## 2013-11-16 NOTE — H&P (Signed)
Heidi Sexton is an 53 y.o. female.   Chief Complaint: right breast cancer HPI:  Pt is 53 yo F BRCA positive with history of left breast cancer 16 years ago.  She presented with abnormal screening MRI.  She was found to have what appeared to be an intramammary LN that was positive for cancer. We had long pre op discussions about lumpectomy vs mastectomy.    Past Medical History  Diagnosis Date  . Cancer   . Breast cancer   . Complication of anesthesia   . PONV (postoperative nausea and vomiting)   . Heart murmur     during pregnancy only- then resolved   . Anxiety     h/o panic attacks - relative to migranie headaches   . Headache(784.0)     2 migraine headaches this yr., states she has been seen by Neurology- - 20 yrs.      Past Surgical History  Procedure Laterality Date  . Abdominal hysterectomy  2011  . Portacath placement      also had hickman cath. put in   . Breast surgery  1996    mastectomy- & reconstruction - 2001    Family History  Problem Relation Age of Onset  . Liver cancer Mother    Social History:  reports that she quit smoking about 20 years ago. She has never used smokeless tobacco. She reports that she does not drink alcohol or use illicit drugs.  Allergies:  Allergies  Allergen Reactions  . Adhesive [Tape] Hives  . Aleve [Naproxen] Itching  . Advil [Ibuprofen]     Itchy red face    Medications Prior to Admission  Medication Sig Dispense Refill  . acetaminophen (TYLENOL) 500 MG tablet Take 1,000 mg by mouth every 6 (six) hours as needed.      . loratadine (CLARITIN) 10 MG tablet Take 10 mg by mouth daily.       . Cholecalciferol (VITAMIN D) 2000 UNITS tablet Take 2,000 Units by mouth daily.        No results found for this or any previous visit (from the past 48 hour(s)). No results found.  Review of Systems  All other systems reviewed and are negative.   Blood pressure 132/71, pulse 69, temperature 97.9 F (36.6 C), temperature source  Oral, resp. rate 20, weight 211 lb (95.709 kg), SpO2 100.00%. Physical Exam  Constitutional: She is oriented to person, place, and time. She appears well-developed and well-nourished. No distress.  HENT:  Head: Normocephalic and atraumatic.  Eyes: No scleral icterus.  Neck: Normal range of motion.  Cardiovascular: Normal rate and regular rhythm.   Respiratory: Effort normal. No respiratory distress.  GI: Soft. She exhibits no distension. There is no tenderness.  Neurological: She is alert and oriented to person, place, and time.  Skin: Skin is warm and dry. No rash noted. She is not diaphoretic. No erythema. No pallor.  Psychiatric: She has a normal mood and affect. Her behavior is normal. Judgment and thought content normal.     Assessment/Plan BRCA New right breast cancer  Plan right mastectomy and SLN bx.   Reviewed risks and benefits. Pt ready to proceed.    Dr. Harlow Mares to follow with reconstruction.    Refoel Palladino 11/16/2013, 7:45 AM

## 2013-11-17 ENCOUNTER — Other Ambulatory Visit: Payer: Self-pay | Admitting: Oncology

## 2013-11-17 MED ORDER — DEXTROSE-NACL 5-0.45 % IV SOLN
INTRAVENOUS | Status: DC
  Administered 2013-11-17 – 2013-11-18 (×4): via INTRAVENOUS

## 2013-11-17 MED ORDER — METHOCARBAMOL 500 MG PO TABS
500.0000 mg | ORAL_TABLET | Freq: Four times a day (QID) | ORAL | Status: DC
Start: 2013-11-17 — End: 2013-11-18
  Administered 2013-11-17 – 2013-11-18 (×6): 500 mg via ORAL
  Filled 2013-11-17 (×9): qty 1

## 2013-11-17 MED ORDER — HYDROMORPHONE HCL 2 MG PO TABS
2.0000 mg | ORAL_TABLET | ORAL | Status: DC | PRN
  Administered 2013-11-17: 2 mg via ORAL
  Administered 2013-11-17 – 2013-11-18 (×2): 4 mg via ORAL
  Administered 2013-11-18: 2 mg via ORAL
  Administered 2013-11-18: 4 mg via ORAL
  Filled 2013-11-17 (×3): qty 2
  Filled 2013-11-17: qty 1
  Filled 2013-11-17: qty 2

## 2013-11-17 NOTE — Op Note (Signed)
Heidi Sexton, CRONIC NO.:  [id number]  MEDICAL RECORD NO.:  [id number]  LOCATION:                               FACILITY:  [facility]  PHYSICIAN:  Crissie Reese, M.D.     DATE OF BIRTH:  June 14, 1960  DATE OF PROCEDURE:  11/16/2013 DATE OF DISCHARGE:                              OPERATIVE REPORT   PREOPERATIVE DIAGNOSIS:  Right breast cancer.  POSTOPERATIVE DIAGNOSIS:  Right breast cancer.  PROCEDURES PERFORMED: 1. Right latissimus myocutaneous flap to the right chest. 2. Immediate breast reconstruction right side with a saline implant.  SURGEON:  Crissie Reese, M.D.  ANESTHESIA:  General.  ESTIMATED BLOOD LOSS:  50 mL.  DRAINS:  Four 19-French, two right chest, two right back donor site.  CLINICAL NOTE:  This 53 year old woman has right breast cancer and is having right mastectomy and desired reconstruction.  She had a previous left TRAM flap reconstruction many years ago.  She understood this going to be performed once and would have to have a different technique. Various options were discussed and it appeared to her that the best option in terms of the closest asymmetry would be a latissimus flap with implant.  We discussed different types of implant, silicone and saline and she selected saline.  She understood there would be asymmetry with this final result.  The nature of procedure was discussed with her as well as risks that include, but not limited to, bleeding, infection, healing problems, scarring, loss of sensation, fluid accumulations, anesthesia-related complications, contour deformities, contour deformities at the periphery of the reconstruction, pneumothorax, DVT, PE, failure of device, capsular contracture, displacement device, wrinkles and ripples, asymmetry, loss of tissue, loss of portions of the mastectomy skin flaps, loss of portions or all of the latissimus flap and chronic pain, loss of range of motion strength in the right arm  and shoulder, and overall disappointment.  She understood all of this and wished to proceed.  DESCRIPTION OF PROCEDURE:  The patient had been marked earlier in the holding area in a full standing position for the flap.  General Surgery completed the mastectomy and the wound had been closed with skin staples over laps soaked in antibiotic solution.  A large impervious Steri-Drape had been placed over this.  She was then placed in a left lateral decubitus position with a left axillary roll and stabilized with a beanbag.  She was then prepped with ChloraPrep and draped with sterile drapes including impervious drapes.  The skin paddle was incised and the dissection was carried through the subcutaneous tissue beveling superior and inferior in order to incorporate a larger amount of adipose tissue at the level of the muscle, then straight to the level of skin.  This was done in order to try to ensure blood supply into the skin paddle. Dissection was then carried out to the limits of the muscle both medial, lateral, inferior and superior and then the muscle was divided inferiorly and the dissection was carried out in a cephalad direction. Subcutaneous tunnel was created through to the right chest.  The dissection was continued in a cephalad direction lifting the flap and large perforating vessels were triple or quadruple ligated and divided. The  dissection was continued with great care taken to avoid any injury to the pedicle, thoracodorsal vessels to the muscle.  Care was taken to also avoid any injury to the serratus anterior branch.  Once the flap was mobilized, again antibiotic solution was placed in the right chest through the subcutaneous tunnel.  After removing the laps that had been placed by General Surgery and the flap was passed through this tunnel to the right chest.  The back donor site was irrigated thoroughly with saline and again meticulous hemostasis with electrocautery.   Excellent hemostasis had been ensured.  Two 19-French drains were positioned, brought through separate stab wounds infero-anteriorly and secured with 3-0 Prolene sutures.  The closure with 0 PDS interrupted inverted deep sutures, 2-0 Monocryl interrupted inverted deep dermal sutures and 3-0 Monocryl running subcuticular suture, the couple of 3-0 Prolene simple interrupted sutures for reinforcement at the central aspect of the incision.  Dermabond and dry sterile dressing applied and Biopatch and Tegaderm for the drains.  The patient then placed supine.  Once she was supine and positioned, the sterile Steri-Drape had been placed over the mastectomy incision was removed and she was prepped with Betadine and draped with sterile drapes.  The skin staples were removed. The mastectomy flaps were inspected and found to have excellent color with bright red bleeding at the periphery consistent with viability. The underlying latissimus flap also inspected and bright red bleeding at the periphery even at the very tip of the flap consistent with viability.  Irrigation with saline as well as some antibiotic solution and the insetting of the muscle was performed beginning laterally after having measured the space that was needed for the diameter of the planned saline implant.  A 3-0 Vicryl suture was used for this portion and then 3-0 Vicryl sutures for the inferior and medial insetting of the muscle as well.  A Sizer was soaked in antibiotic solution and then positioned, and filled to the maximum of 525 mL and it was found to be fairly good match with the opposite side.  This was removed.  Again, antibiotic solution placed.  3-0 Vicryl sutures were preplaced for the superior muscle closure to the muscle flap, but left untied.  These were performed with the Sizer in place in order to avoid any damage to the implant when it was positioned.  The Sizer was then removed and the implant was prepared by  soaking it in antibiotic solution.  After thoroughly cleaning the gloves, the implant was prepared.  This was a Mentor 475 plus 50 maximum fill 525 mL Moderate Profile saline implant. 150 mL of sterile saline placed using a closed filling system and all of the air removed and then the implant returned to the antibiotic solution.  The space was inspected.  Excellent hemostasis was confirmed. Betadine was used for the skin edges and avoiding contact with the skin. The implant was positioned and care was taken to make sure that it was flat without any folds or wrinkles and positioned well into the inferior and medial aspect of the space.  It was then filled to the maximum 525 mL using sterile saline via a closed filling system.  Antibiotic solution was placed on the implant and then 3-0 Vicryl sutures that had been preplaced, but the superior closure was then tied securely.  This completed a complete muscular coverage.  Two 19-French drains were positioned, brought out through separate stab wounds inferiorly and secured with 3-0 Prolene sutures.  One of these placed  inferior, medial and superior and the other inferior and then lateral up into the area of the sentinel lymph node biopsy.  The flaps continued to have excellent color and as did the latissimus flap.  Continued to have bright red bleeding at the periphery consistent with viability.  The insetting of the skin paddle with 3-0 Monocryl interrupted inverted deep dermal sutures and running 3-0 Monocryl subcuticular suture.  Dermabond and a dry sterile dressing placed avoiding tape on the skin and Biopatches and Tegaderm were again for the drains and the breast binder was placed avoiding any excessive pressure and she was transferred to the recovery room stable having tolerated the procedure well.    Crissie Reese, M.D.    DB/MEDQ  D:  11/16/2013  T:  11/17/2013  Job:  [id number]

## 2013-11-17 NOTE — Op Note (Deleted)
Heidi Sexton, LOHR NO.:  [id number]  MEDICAL RECORD NO.:  [id number]  LOCATION:                               FACILITY:  [facility]  PHYSICIAN:  Crissie Reese, M.D.     DATE OF BIRTH:  1961-02-11  DATE OF PROCEDURE:  11/16/2013 DATE OF DISCHARGE:                              OPERATIVE REPORT   PREOPERATIVE DIAGNOSIS:  Right breast cancer.  POSTOPERATIVE DIAGNOSIS:  Right breast cancer.  PROCEDURES PERFORMED: 1. Right latissimus myocutaneous flap to the right chest. 2. Immediate breast reconstruction right side with a saline implant.  SURGEON:  Crissie Reese, M.D.  ANESTHESIA:  General.  ESTIMATED BLOOD LOSS:  50 mL.  DRAINS:  Four 19-French, two right chest, two right back donor site.  CLINICAL NOTE:  This 53 year old woman has right breast cancer and is having right mastectomy and desired reconstruction.  She had a previous left TRAM flap reconstruction many years ago.  She understood this going to be performed once and would have to have a different technique. Various options were discussed and it appeared to her that the best option in terms of the closest asymmetry would be a latissimus flap with implant.  We discussed different types of implant, silicone and saline and she selected saline.  She understood there would be asymmetry with this final result.  The nature of procedure was discussed with her as well as risks that include, but not limited to, bleeding, infection, healing problems, scarring, loss of sensation, fluid accumulations, anesthesia-related complications, contour deformities, contour deformities at the periphery of the reconstruction, pneumothorax, DVT, PE, failure of device, capsular contracture, displacement device, wrinkles and ripples, asymmetry, loss of tissue, loss of portions of the mastectomy skin flaps, loss of portions or all of the latissimus flap and chronic pain, loss of range of motion strength in the right arm  and shoulder, and overall disappointment.  She understood all of this and wished to proceed.  DESCRIPTION OF PROCEDURE:  The patient had been marked earlier in the holding area in a full standing position for the flap.  General Surgery completed the mastectomy and the wound had been closed with skin staples over laps soaked in antibiotic solution.  A large impervious Steri-Drape had been placed over this.  She was then placed in a left lateral decubitus position with a left axillary roll and stabilized with a beanbag.  She was then prepped with ChloraPrep and draped with sterile drapes including impervious drapes.  The skin paddle was incised and the dissection was carried through the subcutaneous tissue beveling superior and inferior in order to incorporate a larger amount of adipose tissue at the level of the muscle, then straight to the level of skin.  This was done in order to try to ensure blood supply into the skin paddle. Dissection was then carried out to the limits of the muscle both medial, lateral, inferior and superior and then the muscle was divided inferiorly and the dissection was carried out in a cephalad direction. Subcutaneous tunnel was created through to the right chest.  The dissection was continued in a cephalad direction lifting the flap and large perforating vessels were triple or quadruple ligated and divided. The  dissection was continued with great care taken to avoid any injury to the pedicle, thoracodorsal vessels to the muscle.  Care was taken to also avoid any injury to the serratus anterior branch.  Once the flap was mobilized, again antibiotic solution was placed in the right chest through the subcutaneous tunnel.  After removing the laps that had been placed by General Surgery and the flap was passed through this tunnel to the right chest.  The back donor site was irrigated thoroughly with saline and again meticulous hemostasis with electrocautery.   Excellent hemostasis had been ensured.  Two 19-French drains were positioned, brought through separate stab wounds infero-anteriorly and secured with 3-0 Prolene sutures.  The closure with 0 PDS interrupted inverted deep sutures, 2-0 Monocryl interrupted inverted deep dermal sutures and 3-0 Monocryl running subcuticular suture, the couple of 3-0 Prolene simple interrupted sutures for reinforcement at the central aspect of the incision.  Dermabond and dry sterile dressing applied and Biopatch and Tegaderm for the drains.  The patient then placed supine.  Once she was supine and positioned, the sterile Steri-Drape had been placed over the mastectomy incision was removed and she was prepped with Betadine and draped with sterile drapes.  The skin staples were removed. The mastectomy flaps were inspected and found to have excellent color with bright red bleeding at the periphery consistent with viability. The underlying latissimus flap also inspected and bright red bleeding at the periphery even at the very tip of the flap consistent with viability.  Irrigation with saline as well as some antibiotic solution and the insetting of the muscle was performed beginning laterally after having measured the space that was needed for the diameter of the planned saline implant.  A 3-0 Vicryl suture was used for this portion and then 3-0 Vicryl sutures for the inferior and medial insetting of the muscle as well.  A Sizer was soaked in antibiotic solution and then positioned, and filled to the maximum of 525 mL and it was found to be fairly good match with the opposite side.  This was removed.  Again, antibiotic solution placed.  3-0 Vicryl sutures were preplaced for the superior muscle closure to the muscle flap, but left untied.  These were performed with the Sizer in place in order to avoid any damage to the implant when it was positioned.  The Sizer was then removed and the implant was prepared by  soaking it in antibiotic solution.  After thoroughly cleaning the gloves, the implant was prepared.  This was a Mentor 475 plus 50 maximum fill 525 mL Moderate Profile saline implant. 150 mL of sterile saline placed using a closed filling system and all of the air removed and then the implant returned to the antibiotic solution.  The space was inspected.  Excellent hemostasis was confirmed. Betadine was used for the skin edges and avoiding contact with the skin. The implant was positioned and care was taken to make sure that it was flat without any folds or wrinkles and positioned well into the inferior and medial aspect of the space.  It was then filled to the maximum 525 mL using sterile saline via a closed filling system.  Antibiotic solution was placed on the implant and then 3-0 Vicryl sutures that had been preplaced, but the superior closure was then tied securely.  This completed a complete muscular coverage.  Two 19-French drains were positioned, brought out through separate stab wounds inferiorly and secured with 3-0 Prolene sutures.  One of these placed  inferior, medial and superior and the other inferior and then lateral up into the area of the sentinel lymph node biopsy.  The flaps continued to have excellent color and as did the latissimus flap.  Continued to have bright red bleeding at the periphery consistent with viability.  The insetting of the skin paddle with 3-0 Monocryl interrupted inverted deep dermal sutures and running 3-0 Monocryl subcuticular suture.  Dermabond and a dry sterile dressing placed avoiding tape on the skin and Biopatches and Tegaderm were again for the drains and the breast binder was placed avoiding any excessive pressure and she was transferred to the recovery room stable having tolerated the procedure well.    Crissie Reese, M.D.    DB/MEDQ  D:  11/16/2013  T:  11/17/2013  Job:  [id number]

## 2013-11-17 NOTE — Progress Notes (Signed)
Subjective: Sore but good pain control. Some nausea last night. Better today.  Objective: Vital signs in last 24 hours: Temp:  [97.6 F (36.4 C)-98 F (36.7 C)] 98 F (36.7 C) (09/30 0522) Pulse Rate:  [70-95] 79 (09/30 0522) Resp:  [8-18] 17 (09/30 0522) BP: (131-162)/(45-79) 150/53 mmHg (09/30 0522) SpO2:  [91 %-100 %] 94 % (09/30 0522) Weight:  [212 lb (96.163 kg)] 212 lb (96.163 kg) (09/29 1723)  Intake/Output from previous day: 09/29 0701 - 09/30 0700 In: 3540.8 [P.O.:120; I.V.:3420.8] Out: 3111 [Urine:2805; Emesis/NG output:1; Drains:255; Blood:50] Intake/Output this shift:    Operative sites: Mastectomy flaps look good. Viable. Flap viable. Good color throughout skin paddle. Implant in good position. Drains functioning. Drainage thin. No evidence of bleeding.  No results found for this basename: WBC, HGB, HCT, PLATELETS, NA, K, CL, CO2, BUN, CREATININE, GLU,  in the last 72 hours  Studies/Results: Nm Sentinel Node Inj-no Rpt (breast)  11/16/2013   CLINICAL DATA: right breast cancer   Sulfur colloid was injected intradermally by the nuclear medicine  technologist for breast cancer sentinel node localization.    Dg Chest Port 1 View  11/16/2013   CLINICAL DATA:  Status post central line placement and recent mastectomy with reconstruction  EXAM: PORTABLE CHEST - 1 VIEW  COMPARISON:  None.  FINDINGS: Cardiac shadow is within normal limits. A right jugular central line is noted in the mid superior vena cava. Multiple surgical drains are seen over the right consistent with a recent history. Breast no pneumothorax is noted. The lungs are clear.  IMPRESSION: No evidence of pneumothorax following central line placement  These results will be called to the ordering clinician or representative by the Radiologist Assistant, and communication documented in the PACS or zVision Dashboard.   Electronically Signed   By: Inez Catalina M.D.   On: 11/16/2013 15:23    Assessment/Plan: Ambulate.  D/C foley. Continue DVT prophylaxis. Change to po pain med.   LOS: 1 day    Crissie Reese M 11/17/2013 7:46 AM

## 2013-11-18 ENCOUNTER — Encounter (HOSPITAL_COMMUNITY): Payer: Self-pay | Admitting: General Surgery

## 2013-11-18 MED ORDER — CEPHALEXIN 500 MG PO CAPS: 500.0000 mg | ORAL_CAPSULE | Freq: Four times a day (QID) | ORAL | Status: DC

## 2013-11-18 MED ORDER — DSS 100 MG PO CAPS: 100.0000 mg | ORAL_CAPSULE | Freq: Every day | ORAL | Status: DC

## 2013-11-18 MED ORDER — METHOCARBAMOL 500 MG PO TABS: 500.0000 mg | ORAL_TABLET | Freq: Four times a day (QID) | ORAL | Status: DC

## 2013-11-18 MED ORDER — HYDROMORPHONE HCL 2 MG PO TABS: 2.0000 mg | ORAL_TABLET | ORAL | Status: DC | PRN

## 2013-11-18 MED ORDER — CEPHALEXIN 500 MG PO CAPS
500.0000 mg | ORAL_CAPSULE | Freq: Four times a day (QID) | ORAL | Status: DC
  Administered 2013-11-18 (×2): 500 mg via ORAL
  Filled 2013-11-18 (×2): qty 1

## 2013-11-18 MED ORDER — ENOXAPARIN SODIUM 40 MG/0.4ML ~~LOC~~ SOLN: 40.0000 mg | SUBCUTANEOUS | Status: DC

## 2013-11-18 MED ORDER — ENOXAPARIN SODIUM 40 MG/0.4ML ~~LOC~~ SOLN
40.0000 mg | SUBCUTANEOUS | Status: DC
  Filled 2013-11-18: qty 0.4

## 2013-11-18 NOTE — Progress Notes (Signed)
1 day post op. (delayed entry, pt seen at 8:35 9/30) Subjective: Pain OK.  Pt is very sleepy after just receiving muscle relaxant.    Objective: Vital signs in last 24 hours: Temp:  [98 F (36.7 C)-99.4 F (37.4 C)] 98.3 F (36.8 C) (10/01 0646) Pulse Rate:  [73-79] 78 (10/01 0646) Resp:  [16-17] 16 (10/01 0646) BP: (145-172)/(56-64) 169/64 mmHg (10/01 0646) SpO2:  [97 %-99 %] 98 % (10/01 0646) Last BM Date: 11/16/13  Intake/Output from previous day: 09/30 0701 - 10/01 0700 In: 1368.3 [P.O.:420; I.V.:748.3; IV Piggyback:200] Out: 555 [Urine:300; Drains:255] Intake/Output this shift:    General appearance: sleepy, but arousable and able to make sense.  Resp: breathing comfortably Chest wall: appropriate right sided tenderness.  Flap looks good.  no erythema.  drain serosang.  Lab Results:  No results found for this basename: WBC, HGB, HCT, PLT,  in the last 72 hours BMET No results found for this basename: NA, K, CL, CO2, GLUCOSE, BUN, CREATININE, CALCIUM,  in the last 72 hours PT/INR No results found for this basename: LABPROT, INR,  in the last 72 hours ABG No results found for this basename: PHART, PCO2, PO2, HCO3,  in the last 72 hours  Studies/Results: Dg Chest Port 1 View  11/16/2013   CLINICAL DATA:  Status post central line placement and recent mastectomy with reconstruction  EXAM: PORTABLE CHEST - 1 VIEW  COMPARISON:  None.  FINDINGS: Cardiac shadow is within normal limits. A right jugular central line is noted in the mid superior vena cava. Multiple surgical drains are seen over the right consistent with a recent history. Breast no pneumothorax is noted. The lungs are clear.  IMPRESSION: No evidence of pneumothorax following central line placement  These results will be called to the ordering clinician or representative by the Radiologist Assistant, and communication documented in the PACS or zVision Dashboard.   Electronically Signed   By: Inez Catalina M.D.   On:  11/16/2013 15:23    Anti-infectives: Anti-infectives   Start     Dose/Rate Route Frequency Ordered Stop   11/18/13 0800  cephALEXin (KEFLEX) capsule 500 mg     500 mg Oral 4 times per day 11/18/13 0754     11/16/13 1800  ceFAZolin (ANCEF) IVPB 1 g/50 mL premix    Comments:  Please continue while hospitalized due to implanted device   1 g 100 mL/hr over 30 Minutes Intravenous 4 times per day 11/16/13 1711 11/17/13 1413   11/16/13 0745  bacitracin 50,000 Units, gentamicin (GARAMYCIN) 80 mg, ceFAZolin (ANCEF) 1 g in sodium chloride 0.9 % 1,000 mL      Irrigation To Surgery 11/16/13 0733 11/16/13 1028   11/16/13 0600  ceFAZolin (ANCEF) IVPB 2 g/50 mL premix     2 g 100 mL/hr over 30 Minutes Intravenous On call to O.R. 11/15/13 1410 11/16/13 1137   11/16/13 0600  ceFAZolin (ANCEF) IVPB 2 g/50 mL premix  Status:  Discontinued     2 g 100 mL/hr over 30 Minutes Intravenous On call to O.R. 11/15/13 1410 11/15/13 1410      Assessment/Plan: s/p Procedure(s): RIGHT MASTECTOMY WITH SENTINEL LYMPH NODE BIOPSY (Right) LATISSIMUS FLAP TO BREAST WITH SALINE IMPLANTS (Right) BREAST RECONSTRUCTION (Right) diet as tolerated. OOB IS Pain control   LOS: 2 days    Rhapsody Wolven 11/18/2013

## 2013-11-18 NOTE — Anesthesia Postprocedure Evaluation (Signed)
  Anesthesia Post-op Note  Patient: Heidi Sexton  Procedure(s) Performed: Procedure(s): RIGHT MASTECTOMY WITH SENTINEL LYMPH NODE BIOPSY (Right) LATISSIMUS FLAP TO BREAST WITH SALINE IMPLANTS (Right) BREAST RECONSTRUCTION (Right)  Patient Location: PACU  Anesthesia Type:General  Level of Consciousness: awake and alert   Airway and Oxygen Therapy: Patient Spontanous Breathing  Post-op Pain: mild  Post-op Assessment: Post-op Vital signs reviewed  Post-op Vital Signs: stable  Last Vitals:  Filed Vitals:   11/18/13 0646  BP: 169/64  Pulse: 78  Temp: 36.8 C  Resp: 16    Complications: No apparent anesthesia complications

## 2013-11-18 NOTE — Progress Notes (Signed)
Patient ID: Heidi Sexton, female   DOB: Sep 29, 1960, 53 y.o.   MRN: [id number] POD 2 Subjective: Doing better.  Pain better controlled.    Objective: Vital signs in last 24 hours: Temp:  [98 F (36.7 C)-99.4 F (37.4 C)] 98.3 F (36.8 C) (10/01 0646) Pulse Rate:  [73-79] 78 (10/01 0646) Resp:  [16-17] 16 (10/01 0646) BP: (145-172)/(56-64) 169/64 mmHg (10/01 0646) SpO2:  [97 %-99 %] 98 % (10/01 0646) Last BM Date: 11/16/13  Intake/Output from previous day: 09/30 0701 - 10/01 0700 In: 1368.3 [P.O.:420; I.V.:748.3; IV Piggyback:200] Out: 555 [Urine:300; Drains:255] Intake/Output this shift:    General appearance: much more alert than yesterday.  Resp: breathing comfortably Chest wall: appropriate right sided tenderness.  Flap looks good.  no erythema.  drain serosang.  Lab Results:  No results found for this basename: WBC, HGB, HCT, PLT,  in the last 72 hours BMET No results found for this basename: NA, K, CL, CO2, GLUCOSE, BUN, CREATININE, CALCIUM,  in the last 72 hours PT/INR No results found for this basename: LABPROT, INR,  in the last 72 hours ABG No results found for this basename: PHART, PCO2, PO2, HCO3,  in the last 72 hours  Studies/Results: Dg Chest Port 1 View  11/16/2013   CLINICAL DATA:  Status post central line placement and recent mastectomy with reconstruction  EXAM: PORTABLE CHEST - 1 VIEW  COMPARISON:  None.  FINDINGS: Cardiac shadow is within normal limits. A right jugular central line is noted in the mid superior vena cava. Multiple surgical drains are seen over the right consistent with a recent history. Breast no pneumothorax is noted. The lungs are clear.  IMPRESSION: No evidence of pneumothorax following central line placement  These results will be called to the ordering clinician or representative by the Radiologist Assistant, and communication documented in the PACS or zVision Dashboard.   Electronically Signed   By: Inez Catalina M.D.   On:  11/16/2013 15:23    Anti-infectives: Anti-infectives   Start     Dose/Rate Route Frequency Ordered Stop   11/18/13 0800  cephALEXin (KEFLEX) capsule 500 mg     500 mg Oral 4 times per day 11/18/13 0754     11/16/13 1800  ceFAZolin (ANCEF) IVPB 1 g/50 mL premix    Comments:  Please continue while hospitalized due to implanted device   1 g 100 mL/hr over 30 Minutes Intravenous 4 times per day 11/16/13 1711 11/17/13 1413   11/16/13 0745  bacitracin 50,000 Units, gentamicin (GARAMYCIN) 80 mg, ceFAZolin (ANCEF) 1 g in sodium chloride 0.9 % 1,000 mL      Irrigation To Surgery 11/16/13 0733 11/16/13 1028   11/16/13 0600  ceFAZolin (ANCEF) IVPB 2 g/50 mL premix     2 g 100 mL/hr over 30 Minutes Intravenous On call to O.R. 11/15/13 1410 11/16/13 1137   11/16/13 0600  ceFAZolin (ANCEF) IVPB 2 g/50 mL premix  Status:  Discontinued     2 g 100 mL/hr over 30 Minutes Intravenous On call to O.R. 11/15/13 1410 11/15/13 1410      Assessment/Plan: s/p Procedure(s): RIGHT MASTECTOMY WITH SENTINEL LYMPH NODE BIOPSY (Right) LATISSIMUS FLAP TO BREAST WITH SALINE IMPLANTS (Right) BREAST RECONSTRUCTION (Right) diet as tolerated. Home per Dr. Harlow Mares.     LOS: 2 days    Heidi Sexton 11/18/2013

## 2013-11-18 NOTE — Discharge Instructions (Addendum)
No lifting for 6 weeks No vigorous activity for 6 weeks (including outdoor walks) No driving for 4 weeks OK to walk up stairs slowly Stay propped up Use incentive spirometer at home every hour while awake No shower while drains are in place Empty drains at least three times a day and record the amounts separately Change drain dressings every third day if instructed to do so by Dr. Harlow Mares  Apply Bacitracin antibiotic ointment to the drain sites  Place gauze dressing over drains  Secure the gauze with tape Take an over-the-counter Probiotic while on antibiotics Take an over-the-counter stool softener (such as Colace) while on pain medication Continue Lovenox at home tomorrow at about 2 pm See Dr. Harlow Mares next week For questions call [phone] or [phone]

## 2013-11-18 NOTE — Discharge Summary (Signed)
Physician Discharge Summary  Patient ID: Heidi Sexton MRN: [id number] DOB/AGE: 51-11-1960 51 y.o.  Admit date: 11/16/2013 Discharge date: 11/18/2013  Admission Diagnoses: Right  Breast cancer  Discharge Diagnoses: Same Active Problems:   Breast cancer   Discharged Condition: good  Hospital Course: On the day of admission the patient was taken to surgery and had right mastectomy with sentinel node and right latissimus flap with implant for breast reconstruction. The patient tolerated the procedures well. Postoperatively, the flap maintained excellent color and capillary refill. The patient was ambulatory and tolerating diet on the first postoperative day. DVT and surgical antibiotic prophylaxis continued.  Treatments: antibiotics: Ancef, anticoagulation: heparin and surgery: right mastectomy, sentinel node, latissimus flap, and immediate right breast reconstruction with implant.  Discharge Exam: Blood pressure 169/64, pulse 78, temperature 98.3 F (36.8 C), temperature source Oral, resp. rate 16, height 5' 7.5" (1.715 m), weight 212 lb (96.163 kg), SpO2 98.00%.  Operative sites: Mastectomy flaps viable. Flap viable with good color throughout skin paddle. Implant in good position. Drains functioning. Drainage thin. There is no evidence of bleeding or infection at either site.  Disposition: Discharge home.     Medication List    STOP taking these medications       loratadine 10 MG tablet  Commonly known as:  CLARITIN      TAKE these medications       acetaminophen 500 MG tablet  Commonly known as:  TYLENOL  Take 1,000 mg by mouth every 6 (six) hours as needed.     cephALEXin 500 MG capsule  Commonly known as:  KEFLEX  Take 1 capsule (500 mg total) by mouth every 6 (six) hours.     DSS 100 MG Caps  Take 100 mg by mouth daily.     enoxaparin 40 MG/0.4ML injection  Commonly known as:  LOVENOX  Inject 0.4 mLs (40 mg total) into the skin daily.     HYDROmorphone 2  MG tablet  Commonly known as:  DILAUDID  Take 1-2 tablets (2-4 mg total) by mouth every 4 (four) hours as needed for moderate pain.     methocarbamol 500 MG tablet  Commonly known as:  ROBAXIN  Take 1 tablet (500 mg total) by mouth every 6 (six) hours.     Vitamin D 2000 UNITS tablet  Take 2,000 Units by mouth daily.           Follow-up Information   Follow up with [facility], MD In 2 weeks.   Specialty:  General Surgery   Contact information:   [address] [place] [address] [phone]       Follow up with Macon Large, MD On 11/24/2013.   Specialty:  Plastic Surgery   Contact information:   [address] [place] [address] [phone]       Signed: Macon Large 11/18/2013, 1:18 PM

## 2013-11-19 ENCOUNTER — Telehealth: Payer: Self-pay | Admitting: Oncology

## 2013-11-19 NOTE — Telephone Encounter (Signed)
s.w. pt and advised on 10.21 appt.Marland KitchenMarland KitchenMarland KitchenMarland Kitchenpt ok and aware

## 2013-11-23 DIAGNOSIS — Z1509 Genetic susceptibility to other malignant neoplasm: Principal | ICD-10-CM

## 2013-11-23 DIAGNOSIS — Z1501 Genetic susceptibility to malignant neoplasm of breast: Secondary | ICD-10-CM | POA: Insufficient documentation

## 2013-11-23 NOTE — Addendum Note (Signed)
Addended by: Leda Quail on: 11/23/2013 11:44 AM   Modules accepted: Level of Service

## 2013-11-23 NOTE — Progress Notes (Addendum)
As expected, Heidi Sexton BRCA1 variant called BRCA1, [id number], has been reclassified by [employer] from "suspect deleterious" to "deleterious". All other variants have been reclassified to benign. Heidi Sexton was previously sent a letter by Roma Kayser, MS, [place] regarding this BRCA1 mutation, the increased risk of cancer associated with this mutation and the fact that there are risk-reducing measures Heidi Sexton can participate in to help reduce this risk. We talked with Heidi Sexton today by phone to inform her of this new reclassification and answer any questions so she is aware of these results. A copy of the new report will be scanned into Epic under the media tab.

## 2013-12-07 ENCOUNTER — Other Ambulatory Visit: Payer: Self-pay | Admitting: Oncology

## 2013-12-08 ENCOUNTER — Ambulatory Visit (HOSPITAL_BASED_OUTPATIENT_CLINIC_OR_DEPARTMENT_OTHER): Payer: 59 | Admitting: Oncology

## 2013-12-08 ENCOUNTER — Telehealth: Payer: Self-pay | Admitting: Oncology

## 2013-12-08 ENCOUNTER — Other Ambulatory Visit (INDEPENDENT_AMBULATORY_CARE_PROVIDER_SITE_OTHER): Payer: Self-pay | Admitting: General Surgery

## 2013-12-08 ENCOUNTER — Telehealth: Payer: Self-pay | Admitting: *Deleted

## 2013-12-08 ENCOUNTER — Other Ambulatory Visit (HOSPITAL_BASED_OUTPATIENT_CLINIC_OR_DEPARTMENT_OTHER): Payer: 59

## 2013-12-08 ENCOUNTER — Encounter: Payer: Self-pay | Admitting: Oncology

## 2013-12-08 VITALS — BP 128/79 | HR 70 | Temp 98.5°F | Resp 18 | Ht 67.5 in | Wt 205.8 lb

## 2013-12-08 DIAGNOSIS — C50919 Malignant neoplasm of unspecified site of unspecified female breast: Secondary | ICD-10-CM

## 2013-12-08 DIAGNOSIS — C50411 Malignant neoplasm of upper-outer quadrant of right female breast: Secondary | ICD-10-CM

## 2013-12-08 DIAGNOSIS — Z90710 Acquired absence of both cervix and uterus: Secondary | ICD-10-CM

## 2013-12-08 DIAGNOSIS — Z90722 Acquired absence of ovaries, bilateral: Secondary | ICD-10-CM

## 2013-12-08 DIAGNOSIS — Z171 Estrogen receptor negative status [ER-]: Secondary | ICD-10-CM

## 2013-12-08 DIAGNOSIS — Z9079 Acquired absence of other genital organ(s): Secondary | ICD-10-CM

## 2013-12-08 DIAGNOSIS — Z23 Encounter for immunization: Secondary | ICD-10-CM

## 2013-12-08 LAB — CBC WITH DIFFERENTIAL/PLATELET
BASO%: 0.2 % (ref 0.0–2.0)
Basophils Absolute: 0 10*3/uL (ref 0.0–0.1)
EOS%: 2.3 % (ref 0.0–7.0)
Eosinophils Absolute: 0.1 10*3/uL (ref 0.0–0.5)
HCT: 36.8 % (ref 34.8–46.6)
HGB: 11.8 g/dL (ref 11.6–15.9)
LYMPH%: 37.2 % (ref 14.0–49.7)
MCH: 28 pg (ref 25.1–34.0)
MCHC: 32.1 g/dL (ref 31.5–36.0)
MCV: 87.2 fL (ref 79.5–101.0)
MONO#: 0.4 10*3/uL (ref 0.1–0.9)
MONO%: 7.3 % (ref 0.0–14.0)
NEUT#: 3 10*3/uL (ref 1.5–6.5)
NEUT%: 53 % (ref 38.4–76.8)
PLATELETS: 363 10*3/uL (ref 145–400)
RBC: 4.22 10*6/uL (ref 3.70–5.45)
RDW: 14.2 % (ref 11.2–14.5)
WBC: 5.7 10*3/uL (ref 3.9–10.3)
lymph#: 2.1 10*3/uL (ref 0.9–3.3)

## 2013-12-08 LAB — COMPREHENSIVE METABOLIC PANEL (CC13)
ALK PHOS: 69 U/L (ref 40–150)
ALT: 18 U/L (ref 0–55)
ANION GAP: 11 meq/L (ref 3–11)
AST: 11 U/L (ref 5–34)
Albumin: 3.7 g/dL (ref 3.5–5.0)
BILIRUBIN TOTAL: 0.62 mg/dL (ref 0.20–1.20)
BUN: 14.4 mg/dL (ref 7.0–26.0)
CO2: 25 meq/L (ref 22–29)
CREATININE: 0.8 mg/dL (ref 0.6–1.1)
Calcium: 9.9 mg/dL (ref 8.4–10.4)
Chloride: 107 mEq/L (ref 98–109)
Glucose: 100 mg/dl (ref 70–140)
Potassium: 3.8 mEq/L (ref 3.5–5.1)
SODIUM: 143 meq/L (ref 136–145)
TOTAL PROTEIN: 7.8 g/dL (ref 6.4–8.3)

## 2013-12-08 MED ORDER — INFLUENZA VAC SPLIT QUAD 0.5 ML IM SUSY
0.5000 mL | PREFILLED_SYRINGE | Freq: Once | INTRAMUSCULAR | Status: AC
  Administered 2013-12-08: 0.5 mL via INTRAMUSCULAR
  Filled 2013-12-08: qty 0.5

## 2013-12-08 NOTE — Telephone Encounter (Signed)
per pof to sch pt appt-sent MW email to sch pt trmt-gave pt copy of sch °

## 2013-12-08 NOTE — Telephone Encounter (Signed)
Per staff message and POF I have scheduled appts. Advised scheduler of appts. JMW  

## 2013-12-08 NOTE — Progress Notes (Signed)
OFFICE PROGRESS NOTE   12/08/2013   Physicians: F.BYerly, D.Harlow Mares, K.Winifred Olive (GYN), S.Squire  INTERVAL HISTORY:  Patient is seen, together with husband, in follow up of recently diagnosed triple negative right breast cancer. She had right mastectomy with sentinel node evaluation and immediate reconstruction with right latissimus myocutaneous flap + saline implant by Drs Barry Dienes and Harlow Mares on 11-16-13.  [facility] [phone] from 11-16-13 had grade 3 invasive ductal carcinoma 0.6 cm with 3 sentinel axillary nodes + 1 nonsentinel node negative, closest margin 1 cm, no LVSI, repeat ER and PR testing both 0% and not sufficient tumor for repeat HER 2 (which had been negative on diagnostic biopsy 09-30-13 [phone]). This was her second primary breast cancer, and she does have known BRCA 1 and 2 variants.  Patient has been recovering without complications from surgery, with drains out, Dr Harlow Mares following and Dr Barry Dienes to see later today. Muscle relaxant has been helpful, also prn dilaudid. She has had no fever, no bleeding or drainage, no SOB.   She does not have PAC, but will need this for chemotherapy.  She is given flu vaccine today.     ONCOLOGIC HISTORY History is of left breast cancer in June 1996, when she was [redacted] weeks pregnant. This was 4x4x2.5 cm poorly differentiated invasive ductal and comedo carciinoma,  ER PR negative, HER 2 not tested in 1996. She was treated with 2 cycles of adriamycin at 60 mg/m2 in [place] as she completed the pregnancy, then had left mastectomy with 12 of 23 left axillary nodes positive. She had additional adriamycin and cytoxan in [place] to total adriamycin dose of 300 mg/m2, followed by high dose chemotherapy with cytoxan, CDDP and autologous transplant on protocol at [place] (Dr J.Vredenburgh) , and local radiation on left. We have received treatment records from [facility] for high dose chemotherapy and autologous transplant done 1996,  which I have reviewed and which will be scanned into this EMR. She had left TRAM reconstruction. She had multiple BRCA 1 and 2 variants at last genetics testing. She had hysterectomy with oophorectomy ~ 08-2009, but was reluctant to proceed with prophylactic right mastectomy. She was followed with mammograms + yearly MRIs due to BRCA abnormalities. Daughter [name] is healthy, age 42. Right breast cancer was identified on tomo mammography at [facility] 09-24-13, with heterogeneously dense breast tissue and suspicious area upper outer quadrant. She had MRI 09-27-13 with 9x5x6 mm mass upper outer quadrant on right, otherwise unremarkable bilaterally. US biopsy at [facility] 10-04-13  [phone]) documented invasive mammary carcinoma ER PR and HER 2 negative and proliferation fraction 87%. After consultation with general surgery, plastic surgery, medical oncology and radiation oncology, she agreed to right mastectomy. She had staging CT CAP 10-13-13, unremarkable other than 5 mm low density lesion in dome of right hepatic lobe likely cyst and scarring left lung apex likely from prior RT.   Review of systems as above, also: Is eating, bowels moving. No UTI symptoms. Aware of fullness under right arm from flap. Remainder of 10 point Review of Systems negative.  Objective:  Vital signs in last 24 hours:  BP 128/79  Pulse 70  Temp(Src) 98.5 F (36.9 C) (Oral)  Resp 18  Ht 5' 7.5" (1.715 m)  Wt 205 lb 12.8 oz (93.35 kg)  BMI 31.74 kg/m2 Weight is down 7 lbs Alert, oriented and appropriate. Ambulatory without assistance.   HEENT:PERRL, sclerae not icteric. Oral mucosa moist without lesions, posterior pharynx clear.  Neck supple. No JVD.  Lymphatics:no cervical,suraclavicular, axillary adenopathy Resp: clear to auscultation bilaterally and normal percussion bilaterally Cardio: regular rate and rhythm. No gallop. GI: soft, nontender, not distended, no mass or organomegaly. Normally active bowel  sounds.  Musculoskeletal/ Extremities:bilateral UE and LE without pitting edema, cords, tenderness. Neuro: no peripheral neuropathy. Otherwise nonfocal PSYCH appropriate mood and affect Skin without rash, ecchymosis, petechiae Breasts: right reconstruction appears to be healing well, with no discoloration at flap and much better symmetry with left TRAM. Right back with incision also closed, no swelling or erythema.   Lab Results:  Results for orders placed in visit on 12/08/13  CBC WITH DIFFERENTIAL      Result Value Ref Range   WBC 5.7  3.9 - 10.3 10e3/uL   NEUT# 3.0  1.5 - 6.5 10e3/uL   HGB 11.8  11.6 - 15.9 g/dL   HCT 36.8  34.8 - 46.6 %   Platelets 363  145 - 400 10e3/uL   MCV 87.2  79.5 - 101.0 fL   MCH 28.0  25.1 - 34.0 pg   MCHC 32.1  31.5 - 36.0 g/dL   RBC 4.22  3.70 - 5.45 10e6/uL   RDW 14.2  11.2 - 14.5 %   lymph# 2.1  0.9 - 3.3 10e3/uL   MONO# 0.4  0.1 - 0.9 10e3/uL   Eosinophils Absolute 0.1  0.0 - 0.5 10e3/uL   Basophils Absolute 0.0  0.0 - 0.1 10e3/uL   NEUT% 53.0  38.4 - 76.8 %   LYMPH% 37.2  14.0 - 49.7 %   MONO% 7.3  0.0 - 14.0 %   EOS% 2.3  0.0 - 7.0 %   BASO% 0.2  0.0 - 2.0 %    CMET available after visit entirely normal  Studies/Results:  NICHOL, ATOR Collected: 11/16/2013 Client: [facility] System Accession: [id number] Received: 11/16/2013 [name] REPORT OF SURGICAL PATHOLOGY ADDITIONAL INFORMATION: 5. CHROMOGENIC IN-SITU HYBRIDIZATION Results: HER-2/NEU BY CISH - THERE IS INSUFFICIENT INVASIVE TUMOR PRESENT FOR HER2 ANALYSIS Enid Cutter MD Pathologist, Electronic Signature ( Signed 11/24/2013) 5. PROGNOSTIC INDICATORS - ACIS Results: IMMUNOHISTOCHEMICAL AND MORPHOMETRIC ANALYSIS BY THE AUTOMATED CELLULAR IMAGING SYSTEM (ACIS) Estrogen Receptor: 0%, NEGATIVE Progesterone Receptor: 0%, NEGATIVE COMMENT: The negative hormone receptor study(ies) in this case have an internal positive control. REFERENCE RANGE ESTROGEN  RECEPTOR NEGATIVE <1% POSITIVE =>1% PROGESTERONE RECEPTOR NEGATIVE <1% POSITIVE =>1% All controls stained appropriately 1 of 5 Duplicate copy FINAL for SAKSHI, SERMONS ([id number]) ADDITIONAL INFORMATION:(continued) Claudette Laws MD Pathologist, Electronic Signature ( Signed 11/19/2013) FINAL DIAGNOSIS Diagnosis 1. Breast, simple mastectomy, Right - FIBROCYSTIC CHANGES WITH HEMORRHAGE. - ONE INTRAMAMMARY LYMPH NODE (0/1). - NO MALIGNANCY IDENTIFIED. 2. Lymph node, sentinel, biopsy, Right axillary #1 - ONE BENIGN LYMPH NODE. 3. Lymph node, sentinel, biopsy, Right axillary #2 - ONE BENIGN LYMPH NODE. 4. Lymph node, sentinel, biopsy, Right axillary #3 - ONE BENIGN LYMPH NODE. 5. Breast, excision, Right additional - INVASIVE DUCTAL CARCINOMA, 0.6 CM. - MARGINS NOT INVOLVED. 6. Skin , portion of right mastectomy skin flap - BENIGN SKIN. NO TUMOR IDENTIFIED. Microscopic Comment 5. BREAST, INVASIVE TUMOR, WITH LYMPH NODE SAMPLING Specimen, including laterality and lymph node sampling (sentinel, non-sentinel): Right breast with three sentinel lymph nodes and one non-sentinel lymph node Procedure: Mastectomy with additional excision and sentinel lymph node biopsy Histologic type: Ductal Grade: III Tubule formation: 3 Nuclear pleomorphism: 2 Mitotic: 3 Tumor size (glass slide measurement): 0.6cm Margins: Free of tumor Invasive, distance to closest margin: 1 cm from lateral margin In-situ, distance to closest  margin: N/A If margin positive, focally or broadly: N/A Lymphovascular invasion: No Ductal carcinoma in situ: No Grade: N/A Extensive intraductal component: N/A Lobular neoplasia: No Tumor focality: Unifocal Treatment effect: No 2 of 5 Duplicate copy FINAL for MARILU, RYLANDER [phone]) Microscopic Comment(continued) If present, treatment effect in breast tissue, lymph nodes or both: N/A Extent of tumor: Skin: Free of tumor Nipple: Free of  tumor Skeletal muscle: N/A Lymph nodes: Examined: 3 Sentinel 1 Non-sentinel 4 Total Lymph nodes with metastasis: 0 Isolated tumor cells (< 0.2 mm): 0 Micrometastasis: (> 0.2 mm and < 2.0 mm): 0 Macrometastasis: (> 2.0 mm): 0 Extracapsular extension: N/A Breast prognostic profile: Case [id number] Estrogen receptor: 0%, negative, will be repeated on current specimen Progesterone receptor: 0%, negative, will be repeated on current specimen Her 2 neu: No amplification, ratio 1.40, will be repeated on current specimen Ki-67: 87% Non-neoplastic breast: Fibrocystic changes TNM: pT1b, pN0, pMX Comments: In the additional right breast tissue there is focal residual invasive ductal carcinoma which does not involve the margins of the specimen. (JDP:kh 11-17-13)  Medications: I have reviewed the patient's current medications.  DISCUSSION: we have discussed surgical path information as above. I have explained that in triple negative disease chemotherapy is recommended for tumors >=0.5 cm, even if nodes negative. They understand that all information re previous chemotherapy has been reviewed; they are relieved that high dose with transplant is not being recommended now. I have talked with them about carboplatin + taxotere q 3 weeks x 4 cycles followed by weekly taxol x12, to begin probably in next ~ 3 weeks. I have explained that triple negative breast cancers tend to be more aggressive, but also seem to have greater benefit from adjuvant chemotherapy than receptor +. Response rate with carboplatin also can be higher with BRCA abnormalities. She will need PAC for chemo due to bilateral axillary node surgeries and left RT; she agrees to [facility] and I have let Dr Barry Dienes know. We have discussed having daughter Museum/gallery conservator meet with Retail buyer.  Patient still needs teaching for this chemotherapy, which will likely be best in chemo education class as it has been ~ 19 years since previous (extensive)  chemotherapy. I will see her back first week in Nov, teaching class prior. We will tentatively plan for first [place] taxotere week of Nov 9 if ok with surgeons.  Relda plans to be out of work/ work from home during chemo. Husband is on disability for back problems. Amber is living at home now.  Case is to be presented at Multidisciplinary Breast Conference 01-08-14.  Assessment/Plan: 1.Triple negative right breast cancer 0.6 cm, 4 nodes negative: post right mastectomy with axillary node evaluation and immediate reconstruction with latissimus flap 11-16-13. Plan adjuvant chemotherapy based on size of triple negative tumor, with carbo taxotere followed by weekly taxol. 2.ER PR negative left breast cancer 1996: 4 cm primary, 12 nodes involved, ER PR negative (HER 2 not tested in 1996), diagnosed when 7 months pregnant, treated with adriamycin prior to delivery, then [facility] to total adria dose 300 mg/m2, then high dose therapy with cytoxan, CDDP, carmustine and autologous BMT at [facility], then RT. No known recurrent disease. Left TRAM reconstruction.  Note Dr Isidore Moos had planned to obtain RT records from 1996 to be sure these are available in present EMR. 3.multiple BRCA 1 and 2 variants 4.peripheral IV access not adequate for chemotherapy due to bilateral axillary node surgeries   All questions answered. Patient is understandably disheartened that she needs chemotherapy and  needs PAC, but is willing to proceed. Husband is very supportive.They know to call if questions or concerns prior to next scheduled visit. Time spent 60 min including >50% counseling and coordination of care.     LIVESAY,LENNIS P, MD   12/08/2013, 10:18 AM

## 2013-12-11 DIAGNOSIS — Z90710 Acquired absence of both cervix and uterus: Secondary | ICD-10-CM | POA: Insufficient documentation

## 2013-12-11 DIAGNOSIS — Z9079 Acquired absence of other genital organ(s): Secondary | ICD-10-CM

## 2013-12-11 DIAGNOSIS — Z90722 Acquired absence of ovaries, bilateral: Secondary | ICD-10-CM

## 2013-12-13 ENCOUNTER — Telehealth: Payer: Self-pay | Admitting: Oncology

## 2013-12-13 NOTE — Telephone Encounter (Signed)
, °

## 2013-12-14 ENCOUNTER — Other Ambulatory Visit: Payer: 59

## 2013-12-14 ENCOUNTER — Encounter: Payer: Self-pay | Admitting: *Deleted

## 2013-12-15 ENCOUNTER — Other Ambulatory Visit: Payer: Self-pay | Admitting: Oncology

## 2013-12-15 ENCOUNTER — Telehealth: Payer: Self-pay | Admitting: Oncology

## 2013-12-15 NOTE — Telephone Encounter (Signed)
, °

## 2013-12-16 ENCOUNTER — Telehealth: Payer: Self-pay | Admitting: *Deleted

## 2013-12-16 ENCOUNTER — Telehealth: Payer: Self-pay | Admitting: Oncology

## 2013-12-16 NOTE — Telephone Encounter (Signed)
Per staff message and POF I have scheduled appts. Advised scheduler of appts. JMW  

## 2013-12-16 NOTE — Telephone Encounter (Signed)
, °

## 2013-12-20 ENCOUNTER — Encounter: Payer: Self-pay | Admitting: Oncology

## 2013-12-21 ENCOUNTER — Other Ambulatory Visit: Payer: Self-pay | Admitting: Oncology

## 2013-12-21 ENCOUNTER — Other Ambulatory Visit: Payer: Self-pay | Admitting: *Deleted

## 2013-12-21 ENCOUNTER — Telehealth: Payer: Self-pay | Admitting: *Deleted

## 2013-12-21 DIAGNOSIS — C50912 Malignant neoplasm of unspecified site of left female breast: Secondary | ICD-10-CM

## 2013-12-21 MED ORDER — LIDOCAINE-PRILOCAINE 2.5-2.5 % EX CREA
TOPICAL_CREAM | CUTANEOUS | Status: DC
Start: 2013-12-21 — End: 2015-09-20

## 2013-12-21 MED ORDER — DEXAMETHASONE 4 MG PO TABS: ORAL_TABLET | ORAL | Status: DC

## 2013-12-21 MED ORDER — ONDANSETRON HCL 8 MG PO TABS: 8.0000 mg | ORAL_TABLET | Freq: Three times a day (TID) | ORAL | Status: DC | PRN

## 2013-12-21 MED ORDER — LORAZEPAM 1 MG PO TABS: 1.0000 mg | ORAL_TABLET | Freq: Four times a day (QID) | ORAL | Status: DC | PRN

## 2013-12-21 NOTE — Telephone Encounter (Signed)
Pt notified of scripts sent to pharmacy for decadron, zofran, ativan & emla cream.  Instructions given per Dr Marko Plume.

## 2013-12-21 NOTE — Telephone Encounter (Signed)
This was done.

## 2013-12-21 NOTE — Telephone Encounter (Signed)
-----   Message from Gordy Levan, MD sent at 12/21/2013  1:02 PM EST ----- For chemo beginning 11-10.  Please send scripts to pharmacy, generics always fine:  Decadron 4 mg   2 tabs with food bid x 3 days begin day prior to taxotere  #12   (we can refill for #36 after first cycle, for last 3 cycles of taxotere)  zofran 8 mg   1 every 8 hrs prn nausea. Will not make drowsy.  #30  2 RF  Ativan 1 mg    1 SL or po q 6 hrs prn nausea.  Will make drowsy  #20  NRF  Emla cream   2 RF   thanks

## 2013-12-22 ENCOUNTER — Ambulatory Visit (HOSPITAL_BASED_OUTPATIENT_CLINIC_OR_DEPARTMENT_OTHER): Payer: 59 | Admitting: Oncology

## 2013-12-22 ENCOUNTER — Encounter: Payer: Self-pay | Admitting: Oncology

## 2013-12-22 ENCOUNTER — Encounter (HOSPITAL_BASED_OUTPATIENT_CLINIC_OR_DEPARTMENT_OTHER): Payer: Self-pay | Admitting: *Deleted

## 2013-12-22 ENCOUNTER — Other Ambulatory Visit (HOSPITAL_BASED_OUTPATIENT_CLINIC_OR_DEPARTMENT_OTHER): Payer: 59

## 2013-12-22 VITALS — BP 123/63 | HR 63 | Temp 98.5°F | Resp 18 | Ht 67.5 in | Wt 206.3 lb

## 2013-12-22 DIAGNOSIS — C50411 Malignant neoplasm of upper-outer quadrant of right female breast: Secondary | ICD-10-CM

## 2013-12-22 LAB — COMPREHENSIVE METABOLIC PANEL (CC13)
ALT: 12 U/L (ref 0–55)
AST: 11 U/L (ref 5–34)
Albumin: 3.7 g/dL (ref 3.5–5.0)
Alkaline Phosphatase: 73 U/L (ref 40–150)
Anion Gap: 9 mEq/L (ref 3–11)
BUN: 14.6 mg/dL (ref 7.0–26.0)
CALCIUM: 9.3 mg/dL (ref 8.4–10.4)
CO2: 24 meq/L (ref 22–29)
CREATININE: 0.9 mg/dL (ref 0.6–1.1)
Chloride: 106 mEq/L (ref 98–109)
Glucose: 121 mg/dl (ref 70–140)
POTASSIUM: 3.9 meq/L (ref 3.5–5.1)
Sodium: 140 mEq/L (ref 136–145)
TOTAL PROTEIN: 7.1 g/dL (ref 6.4–8.3)
Total Bilirubin: 0.45 mg/dL (ref 0.20–1.20)

## 2013-12-22 LAB — CBC WITH DIFFERENTIAL/PLATELET
BASO%: 0.5 % (ref 0.0–2.0)
Basophils Absolute: 0 10*3/uL (ref 0.0–0.1)
EOS%: 3.6 % (ref 0.0–7.0)
Eosinophils Absolute: 0.2 10*3/uL (ref 0.0–0.5)
HEMATOCRIT: 35.6 % (ref 34.8–46.6)
HEMOGLOBIN: 11.2 g/dL — AB (ref 11.6–15.9)
LYMPH#: 1.9 10*3/uL (ref 0.9–3.3)
LYMPH%: 37.7 % (ref 14.0–49.7)
MCH: 27.7 pg (ref 25.1–34.0)
MCHC: 31.3 g/dL — AB (ref 31.5–36.0)
MCV: 88.5 fL (ref 79.5–101.0)
MONO#: 0.4 10*3/uL (ref 0.1–0.9)
MONO%: 7.1 % (ref 0.0–14.0)
NEUT#: 2.6 10*3/uL (ref 1.5–6.5)
NEUT%: 51.1 % (ref 38.4–76.8)
Platelets: 273 10*3/uL (ref 145–400)
RBC: 4.02 10*6/uL (ref 3.70–5.45)
RDW: 14.6 % — ABNORMAL HIGH (ref 11.2–14.5)
WBC: 5 10*3/uL (ref 3.9–10.3)

## 2013-12-22 MED ORDER — ACYCLOVIR 200 MG PO CAPS: 200.0000 mg | ORAL_CAPSULE | Freq: Three times a day (TID) | ORAL | Status: DC

## 2013-12-22 NOTE — Progress Notes (Signed)
No new labs needed 

## 2013-12-22 NOTE — Progress Notes (Signed)
OFFICE PROGRESS NOTE   12/22/2013   Physicians:F.BYerly, D.Harlow Mares, K.Winifred Olive (GYN), S.Squire  INTERVAL HISTORY:  Patient is seen, together with husband, in continuing attention to second primary breast cancer, this triple negative on right,  0.6 cm, node negative at mastectomy and total 4 axillary node evaluation 11-16-2013. She is for [facility] by Dr Barry Dienes prior to start of adjuvant chemotherapy on 12-28-13. She and husband attended chenotherapy education class on 12-14-13 for carbo taxotere x 4 then weekly taxol x12. She had follow up with Dr Harlow Mares this week and he is in agreement with start of chemo as planned, per patient and per my conversation with his staff now; his note from 12-20-13 received after this visit and will be scanned into EMR.  Patient has continued to improve from surgery, tho she still needs muscle relaxant 2-3x daily. Discomfort is right side of sternum to right back. She is still limiting activities, but has started some ROM exercises. Appetite is better.   Patient reminds me that she had zoster shortly after treatment for left breast cancer. With steroids needed for upcoming chemotherapy, will put on prophylactic acyclovir for duration of chemotherapy.  She does not have PAC, but will need this for chemotherapy.  She is given flu vaccine   ONCOLOGIC HISTORY History is of left breast cancer in June 1996, when she was [redacted] weeks pregnant. This was 4x4x2.5 cm poorly differentiated invasive ductal and comedo carciinoma, ER PR negative, HER 2 not tested in 1996. She was treated with 2 cycles of adriamycin at 60 mg/m2 in [place] as she completed the pregnancy, then had left mastectomy with 12 of 23 left axillary nodes positive. She had additional adriamycin and cytoxan in [place] to total adriamycin dose of 300 mg/m2, followed by high dose chemotherapy with cytoxan, CDDP and autologous transplant on protocol at [facility] (Dr J.Vredenburgh) , and local radiation on  left. We have received treatment records from [facility] for high dose chemotherapy and autologous transplant done 1996, which I have reviewed and which will be scanned into this EMR. She had left TRAM reconstruction. She had multiple BRCA 1 and 2 variants at last genetics testing. She had hysterectomy with oophorectomy ~ 08-2009, but was reluctant to proceed with prophylactic right mastectomy. She was followed with mammograms + yearly MRIs due to BRCA abnormalities. Daughter [name] is healthy, age 42. Right breast cancer was identified on tomo mammography at [facility] 09-24-13, with heterogeneously dense breast tissue and suspicious area upper outer quadrant. She had MRI 09-27-13 with 9x5x6 mm mass upper outer quadrant on right, otherwise unremarkable bilaterally. US biopsy at [facility] 10-04-13 [phone]) documented invasive mammary carcinoma ER PR and HER 2 negative and proliferation fraction 87%. After consultation with general surgery, plastic surgery, medical oncology and radiation oncology, she agreed to right mastectomy. She had staging CT CAP 10-13-13, unremarkable other than 5 mm low density lesion in dome of right hepatic lobe likely cyst and scarring left lung apex likely from prior RT.She had right mastectomy with sentinel node evaluation and immediate reconstruction with right latissimus myocutaneous flap + saline implant by Drs Barry Dienes and Harlow Mares on 11-16-13. [facility] [phone] from 11-16-13 had grade 3 invasive ductal carcinoma 0.6 cm with 3 sentinel axillary nodes + 1 nonsentinel node negative, closest margin 1 cm, no LVSI, repeat ER and PR testing both 0% and not sufficient tumor for repeat HER 2 (which had been negative on diagnostic biopsy 09-30-13 [phone]).   Review of systems as above, also: No  fever or symptoms of infection. No bleeding. No increased SOB, is using incentive spirometer. Bowels ok. No swelling LERemainder of 10 point Review of Systems  negative.  Objective:  Vital signs in last 24 hours:  BP 123/63 mmHg  Pulse 63  Temp(Src) 98.5 F (36.9 C) (Oral)  Resp 18  Ht 5' 7.5" (1.715 m)  Wt 206 lb 4.8 oz (93.577 kg)  BMI 31.82 kg/m2 Weight is up 1 lb Alert, oriented and appropriate. Ambulatory slowly without assistance..  No alopecia  HEENT:PERRL, sclerae not icteric. Oral mucosa moist without lesions, posterior pharynx clear.  Neck supple. No JVD.  Lymphatics:no cervical,suraclavicular, axillary adenopathy Resp: clear to auscultation bilaterally and normal percussion bilaterally Cardio: regular rate and rhythm. No gallop. GI: soft, nontender, not distended, no mass or organomegaly. Normally active bowel sounds.  Musculoskeletal/ Extremities: without pitting edema, cords, tenderness. Can raise RUE to 90 degrees laterally. Neuro: no peripheral neuropathy. Otherwise nonfocal Skin without rash, ecchymosis, petechiae Breasts: Left reconstructed breast not remarkable. Right reconstructed breast less inferior swelling, incisions all closed, incision on back closed and improved. Marland Kitchen Axillae benign.   Lab Results:  Results for orders placed or performed in visit on 12/22/13  CBC with Differential  Result Value Ref Range   WBC 5.0 3.9 - 10.3 10e3/uL   NEUT# 2.6 1.5 - 6.5 10e3/uL   HGB 11.2 (L) 11.6 - 15.9 g/dL   HCT 35.6 34.8 - 46.6 %   Platelets 273 145 - 400 10e3/uL   MCV 88.5 79.5 - 101.0 fL   MCH 27.7 25.1 - 34.0 pg   MCHC 31.3 (L) 31.5 - 36.0 g/dL   RBC 4.02 3.70 - 5.45 10e6/uL   RDW 14.6 (H) 11.2 - 14.5 %   lymph# 1.9 0.9 - 3.3 10e3/uL   MONO# 0.4 0.1 - 0.9 10e3/uL   Eosinophils Absolute 0.2 0.0 - 0.5 10e3/uL   Basophils Absolute 0.0 0.0 - 0.1 10e3/uL   NEUT% 51.1 38.4 - 76.8 %   LYMPH% 37.7 14.0 - 49.7 %   MONO% 7.1 0.0 - 14.0 %   EOS% 3.6 0.0 - 7.0 %   BASO% 0.5 0.0 - 2.0 %    CMET available after visit entirely normal  Studies/Results:  No results found.  Medications: I have reviewed the patient's  current medications. Prescriptions sent to pharmacy earlier this week for decadron 8 mg bid x 3 days start day prior to chemo (see below for cycle 1), ativan 1 mg q 6 hr prn, zofran 8 mg tid prn, EMLA Will use acyclovir 200 mg tid as prophylaxis during upcoming chemo.  DISCUSSION: reviewed planned course of adjuvant therapy with carbo taxotere q 3 weeks  x 4 with neulasta on day 2,  then low dose taxol x 12weeks. Reviewed steroids for taxotere, which she will begin evening of 11-9 due to NPO that AM for [facility] placement. Reviewed possible side effects of chemo. All questions answered. Verbal consent obtained.  She will speak with HR for her work and I will complete paperwork as needed.  Assessment/Plan: 1.Triple negative right breast cancer 0.6 cm, 4 nodes negative: post right mastectomy with axillary node evaluation and immediate reconstruction with latissimus flap 11-16-13. Plan adjuvant chemotherapy based on size of triple negative tumor, with carbo taxotere followed by weekly taxol. Cycle 1 carbo taxotere 12-28-13 with neulasta on 12-29-13. I will see her back 11-16 and 01-10-14, following counts closely with prior treatment history. 2.left breast cancer 1996: 4 cm primary, 12 nodes involved, ER PR negative (  HER 2 not tested in 1996), diagnosed when 7 months pregnant, treated with adriamycin prior to delivery, then [facility] to total adria dose 300 mg/m2, then high dose therapy with cytoxan, CDDP, carmustine and autologous BMT at [facility], then RT. No known recurrent disease. Left TRAM reconstruction. Note Dr Isidore Moos had planned to obtain RT records from 1996 to be sure these are available in present EMR. 3.multiple BRCA 1 and 2 variants 4.peripheral IV access not adequate for chemotherapy due to bilateral axillary node surgeries. For PAC by Dr Barry Dienes 5.zoster shortly after completion of treatment 1996: prophylaxis with acyclovir due to steroids with upcoming chemo 6.flu vaccine done  Chemo and neulasta orders  confirmed.  Time spent 45 min including >50% counseling and coordination of care.   Heidi Sexton P, MD   12/22/2013, 3:50 PM

## 2013-12-27 ENCOUNTER — Ambulatory Visit (HOSPITAL_BASED_OUTPATIENT_CLINIC_OR_DEPARTMENT_OTHER)
Admission: RE | Admit: 2013-12-27 | Discharge: 2013-12-27 | Disposition: A | Discharge status: Home / Self Care | Payer: 59 | Source: Ambulatory Visit | Attending: General Surgery | Admitting: General Surgery

## 2013-12-27 ENCOUNTER — Ambulatory Visit (HOSPITAL_BASED_OUTPATIENT_CLINIC_OR_DEPARTMENT_OTHER): Payer: 59 | Admitting: Anesthesiology

## 2013-12-27 ENCOUNTER — Ambulatory Visit: Payer: 59

## 2013-12-27 ENCOUNTER — Ambulatory Visit (HOSPITAL_COMMUNITY): Payer: 59

## 2013-12-27 ENCOUNTER — Other Ambulatory Visit: Payer: Self-pay | Admitting: Oncology

## 2013-12-27 ENCOUNTER — Encounter (HOSPITAL_BASED_OUTPATIENT_CLINIC_OR_DEPARTMENT_OTHER): Payer: Self-pay | Admitting: *Deleted

## 2013-12-27 ENCOUNTER — Other Ambulatory Visit: Payer: 59

## 2013-12-27 ENCOUNTER — Encounter (HOSPITAL_BASED_OUTPATIENT_CLINIC_OR_DEPARTMENT_OTHER)
Admission: RE | Disposition: A | Discharge status: Home / Self Care | Payer: Self-pay | Source: Ambulatory Visit | Attending: General Surgery

## 2013-12-27 DIAGNOSIS — Z87891 Personal history of nicotine dependence: Secondary | ICD-10-CM | POA: Diagnosis not present

## 2013-12-27 DIAGNOSIS — Z853 Personal history of malignant neoplasm of breast: Secondary | ICD-10-CM | POA: Diagnosis not present

## 2013-12-27 DIAGNOSIS — C50911 Malignant neoplasm of unspecified site of right female breast: Secondary | ICD-10-CM | POA: Diagnosis not present

## 2013-12-27 DIAGNOSIS — Z95828 Presence of other vascular implants and grafts: Secondary | ICD-10-CM

## 2013-12-27 DIAGNOSIS — Z888 Allergy status to other drugs, medicaments and biological substances status: Secondary | ICD-10-CM | POA: Diagnosis not present

## 2013-12-27 DIAGNOSIS — G43909 Migraine, unspecified, not intractable, without status migrainosus: Secondary | ICD-10-CM | POA: Diagnosis not present

## 2013-12-27 DIAGNOSIS — Z886 Allergy status to analgesic agent status: Secondary | ICD-10-CM | POA: Insufficient documentation

## 2013-12-27 DIAGNOSIS — C50411 Malignant neoplasm of upper-outer quadrant of right female breast: Secondary | ICD-10-CM

## 2013-12-27 HISTORY — PX: PORTACATH PLACEMENT: SHX2246

## 2013-12-27 HISTORY — DX: Presence of spectacles and contact lenses: Z97.3

## 2013-12-27 LAB — POCT HEMOGLOBIN-HEMACUE: Hemoglobin: 10.7 g/dL — ABNORMAL LOW (ref 12.0–15.0)

## 2013-12-27 SURGERY — INSERTION, TUNNELED CENTRAL VENOUS DEVICE, WITH PORT
Anesthesia: General | Site: Chest | Laterality: Right

## 2013-12-27 MED ORDER — HEPARIN (PORCINE) IN NACL 2-0.9 UNIT/ML-% IJ SOLN
INTRAMUSCULAR | Status: AC
  Filled 2013-12-27: qty 500

## 2013-12-27 MED ORDER — LACTATED RINGERS IV SOLN
INTRAVENOUS | Status: DC
  Administered 2013-12-27 (×3): via INTRAVENOUS

## 2013-12-27 MED ORDER — HYDROMORPHONE HCL 2 MG PO TABS: 2.0000 mg | ORAL_TABLET | ORAL | Status: DC | PRN

## 2013-12-27 MED ORDER — PROPOFOL 10 MG/ML IV BOLUS
INTRAVENOUS | Status: DC | PRN
  Administered 2013-12-27: 150 mg via INTRAVENOUS

## 2013-12-27 MED ORDER — FENTANYL CITRATE 0.05 MG/ML IJ SOLN: 50.0000 ug | INTRAMUSCULAR | Status: DC | PRN

## 2013-12-27 MED ORDER — FENTANYL CITRATE 0.05 MG/ML IJ SOLN
INTRAMUSCULAR | Status: AC
  Filled 2013-12-27: qty 2

## 2013-12-27 MED ORDER — OXYCODONE HCL 5 MG/5ML PO SOLN: 5.0000 mg | Freq: Once | ORAL | Status: DC | PRN

## 2013-12-27 MED ORDER — PROPOFOL 10 MG/ML IV EMUL
INTRAVENOUS | Status: AC
  Filled 2013-12-27: qty 100

## 2013-12-27 MED ORDER — CEFAZOLIN SODIUM-DEXTROSE 2-3 GM-% IV SOLR
INTRAVENOUS | Status: AC
  Filled 2013-12-27: qty 50

## 2013-12-27 MED ORDER — MIDAZOLAM HCL 2 MG/2ML IJ SOLN
INTRAMUSCULAR | Status: AC
  Filled 2013-12-27: qty 2

## 2013-12-27 MED ORDER — CEFAZOLIN SODIUM-DEXTROSE 2-3 GM-% IV SOLR: 2.0000 g | INTRAVENOUS | Status: DC

## 2013-12-27 MED ORDER — ONDANSETRON HCL 4 MG/2ML IJ SOLN
INTRAMUSCULAR | Status: DC | PRN
  Administered 2013-12-27: 4 mg via INTRAVENOUS

## 2013-12-27 MED ORDER — MIDAZOLAM HCL 2 MG/2ML IJ SOLN: 1.0000 mg | INTRAMUSCULAR | Status: DC | PRN

## 2013-12-27 MED ORDER — DEXAMETHASONE SODIUM PHOSPHATE 4 MG/ML IJ SOLN
INTRAMUSCULAR | Status: DC | PRN
  Administered 2013-12-27: 10 mg via INTRAVENOUS

## 2013-12-27 MED ORDER — SCOPOLAMINE 1 MG/3DAYS TD PT72
1.0000 | MEDICATED_PATCH | Freq: Once | TRANSDERMAL | Status: DC
  Administered 2013-12-27: 1.5 mg via TRANSDERMAL

## 2013-12-27 MED ORDER — LIDOCAINE HCL (CARDIAC) 20 MG/ML IV SOLN
INTRAVENOUS | Status: DC | PRN
  Administered 2013-12-27: 50 mg via INTRAVENOUS

## 2013-12-27 MED ORDER — MIDAZOLAM HCL 5 MG/5ML IJ SOLN
INTRAMUSCULAR | Status: DC | PRN
  Administered 2013-12-27: 2 mg via INTRAVENOUS

## 2013-12-27 MED ORDER — HEPARIN (PORCINE) IN NACL 2-0.9 UNIT/ML-% IJ SOLN
INTRAMUSCULAR | Status: DC | PRN
  Administered 2013-12-27: 500 mL

## 2013-12-27 MED ORDER — PROPOFOL 10 MG/ML IV BOLUS
INTRAVENOUS | Status: AC
  Filled 2013-12-27: qty 60

## 2013-12-27 MED ORDER — HEPARIN SOD (PORK) LOCK FLUSH 100 UNIT/ML IV SOLN
INTRAVENOUS | Status: AC
  Filled 2013-12-27: qty 5

## 2013-12-27 MED ORDER — BUPIVACAINE HCL (PF) 0.25 % IJ SOLN
INTRAMUSCULAR | Status: AC
  Filled 2013-12-27: qty 30

## 2013-12-27 MED ORDER — FENTANYL CITRATE 0.05 MG/ML IJ SOLN
INTRAMUSCULAR | Status: DC | PRN
  Administered 2013-12-27: 100 ug via INTRAVENOUS

## 2013-12-27 MED ORDER — HEPARIN SOD (PORK) LOCK FLUSH 100 UNIT/ML IV SOLN
INTRAVENOUS | Status: DC | PRN
  Administered 2013-12-27: 500 [IU] via INTRAVENOUS

## 2013-12-27 MED ORDER — FENTANYL CITRATE 0.05 MG/ML IJ SOLN
25.0000 ug | INTRAMUSCULAR | Status: DC | PRN
  Administered 2013-12-27 (×2): 25 ug via INTRAVENOUS

## 2013-12-27 MED ORDER — SCOPOLAMINE 1 MG/3DAYS TD PT72
MEDICATED_PATCH | TRANSDERMAL | Status: AC
  Filled 2013-12-27: qty 1

## 2013-12-27 MED ORDER — BUPIVACAINE-EPINEPHRINE 0.5% -1:200000 IJ SOLN
INTRAMUSCULAR | Status: DC | PRN
  Administered 2013-12-27: 10 mL

## 2013-12-27 MED ORDER — OXYCODONE HCL 5 MG PO TABS: 5.0000 mg | ORAL_TABLET | Freq: Once | ORAL | Status: DC | PRN

## 2013-12-27 MED ORDER — BUPIVACAINE-EPINEPHRINE (PF) 0.5% -1:200000 IJ SOLN
INTRAMUSCULAR | Status: AC
  Filled 2013-12-27: qty 30

## 2013-12-27 MED ORDER — LIDOCAINE-EPINEPHRINE (PF) 1 %-1:200000 IJ SOLN
INTRAMUSCULAR | Status: AC
  Filled 2013-12-27: qty 10

## 2013-12-27 SURGICAL SUPPLY — 41 items
BAG DECANTER FOR FLEXI CONT (MISCELLANEOUS) ×3 IMPLANT
BLADE HEX COATED 2.75 (ELECTRODE) ×3 IMPLANT
BLADE SURG 11 STRL SS (BLADE) ×3 IMPLANT
BLADE SURG 15 STRL LF DISP TIS (BLADE) ×1 IMPLANT
BLADE SURG 15 STRL SS (BLADE) ×2
CHLORAPREP W/TINT 26ML (MISCELLANEOUS) ×3 IMPLANT
COVER BACK TABLE 60X90IN (DRAPES) ×3 IMPLANT
COVER MAYO STAND STRL (DRAPES) ×3 IMPLANT
DECANTER SPIKE VIAL GLASS SM (MISCELLANEOUS) IMPLANT
DRAPE C-ARM 42X72 X-RAY (DRAPES) ×3 IMPLANT
DRAPE LAPAROTOMY TRNSV 102X78 (DRAPE) ×3 IMPLANT
DRAPE UTILITY XL STRL (DRAPES) ×3 IMPLANT
DRSG TEGADERM 2-3/8X2-3/4 SM (GAUZE/BANDAGES/DRESSINGS) ×6 IMPLANT
DRSG TEGADERM 4X4.75 (GAUZE/BANDAGES/DRESSINGS) ×3 IMPLANT
ELECT REM PT RETURN 9FT ADLT (ELECTROSURGICAL) ×3
ELECTRODE REM PT RTRN 9FT ADLT (ELECTROSURGICAL) ×1 IMPLANT
GLOVE BIO SURGEON STRL SZ 6 (GLOVE) ×3 IMPLANT
GLOVE BIOGEL PI IND STRL 6.5 (GLOVE) ×2 IMPLANT
GLOVE BIOGEL PI INDICATOR 6.5 (GLOVE) ×4
GLOVE ECLIPSE 6.5 STRL STRAW (GLOVE) ×3 IMPLANT
GLOVE EXAM NITRILE EXT CUFF MD (GLOVE) ×3 IMPLANT
GOWN STRL REUS W/ TWL LRG LVL3 (GOWN DISPOSABLE) ×1 IMPLANT
GOWN STRL REUS W/TWL 2XL LVL3 (GOWN DISPOSABLE) ×3 IMPLANT
GOWN STRL REUS W/TWL LRG LVL3 (GOWN DISPOSABLE) ×2
KIT PORT POWER 8FR ISP CVUE (Catheter) ×3 IMPLANT
LIQUID BAND (GAUZE/BANDAGES/DRESSINGS) ×3 IMPLANT
NEEDLE HYPO 25X1 1.5 SAFETY (NEEDLE) ×3 IMPLANT
PACK BASIN DAY SURGERY FS (CUSTOM PROCEDURE TRAY) ×3 IMPLANT
PENCIL BUTTON HOLSTER BLD 10FT (ELECTRODE) ×3 IMPLANT
SLEEVE SCD COMPRESS KNEE MED (MISCELLANEOUS) ×3 IMPLANT
SPONGE GAUZE 4X4 12PLY STER LF (GAUZE/BANDAGES/DRESSINGS) ×3 IMPLANT
SUT MNCRL AB 4-0 PS2 18 (SUTURE) ×3 IMPLANT
SUT PROLENE 2 0 SH DA (SUTURE) ×6 IMPLANT
SUT VIC AB 3-0 SH 27 (SUTURE) ×2
SUT VIC AB 3-0 SH 27X BRD (SUTURE) ×1 IMPLANT
SUT VICRYL 3-0 CR8 SH (SUTURE) IMPLANT
SYR 5ML LUER SLIP (SYRINGE) ×3 IMPLANT
SYR CONTROL 10ML LL (SYRINGE) ×3 IMPLANT
SYRINGE 10CC LL (SYRINGE) ×3 IMPLANT
TOWEL OR 17X24 6PK STRL BLUE (TOWEL DISPOSABLE) ×3 IMPLANT
TOWEL OR NON WOVEN STRL DISP B (DISPOSABLE) IMPLANT

## 2013-12-27 NOTE — Discharge Instructions (Addendum)
[facility] °Office Phone Number [phone] ° ° POST OP INSTRUCTIONS ° °Always review your discharge instruction sheet given to you by the facility where your surgery was performed. ° °IF YOU HAVE DISABILITY OR FAMILY LEAVE FORMS, YOU MUST BRING THEM TO THE OFFICE FOR PROCESSING.  DO NOT GIVE THEM TO YOUR DOCTOR. ° °1. A prescription for pain medication may be given to you upon discharge.  Take your pain medication as prescribed, if needed.  If narcotic pain medicine is not needed, then you may take acetaminophen (Tylenol) or ibuprofen (Advil) as needed. °2. Take your usually prescribed medications unless otherwise directed °3. If you need a refill on your pain medication, please contact your pharmacy.  They will contact our office to request authorization.  Prescriptions will not be filled after 5pm or on week-ends. °4. You should eat very light the first 24 hours after surgery, such as soup, crackers, pudding, etc.  Resume your normal diet the day after surgery °5. It is common to experience some constipation if taking pain medication after surgery.  Increasing fluid intake and taking a stool softener will usually help or prevent this problem from occurring.  A mild laxative (Milk of Magnesia or Miralax) should be taken according to package directions if there are no bowel movements after 48 hours. °6. You may shower in 48 hours.  The surgical glue will flake off in 2-3 weeks.   °7. ACTIVITIES:  No strenuous activity or heavy lifting for 1 week.   °a. You may drive when you no longer are taking prescription pain medication, you can comfortably wear a seatbelt, and you can safely maneuver your car and apply brakes. °b. RETURN TO WORK:  __________to be determined_______________ °You should see your doctor in the office for a follow-up appointment approximately three-four weeks after your surgery.   ° °WHEN TO CALL YOUR DOCTOR: °1. Fever over 101.0 °2. Nausea and/or vomiting. °3. Extreme swelling or  bruising. °4. Continued bleeding from incision. °5. Increased pain, redness, or drainage from the incision. ° °The clinic staff is available to answer your questions during regular business hours.  Please don’t hesitate to call and ask to speak to one of the nurses for clinical concerns.  If you have a medical emergency, go to the nearest emergency room or call 911.  A surgeon from [facility] is always on call at the hospital. ° °For further questions, please visit [email or web address]  ° ° ° ° °Post Anesthesia Home Care Instructions ° °Activity: °Get plenty of rest for the remainder of the day. A responsible adult should stay with you for 24 hours following the procedure.  °For the next 24 hours, DO NOT: °-Drive a car °-Operate machinery °-Drink alcoholic beverages °-Take any medication unless instructed by your physician °-Make any legal decisions or sign important papers. ° °Meals: °Start with liquid foods such as gelatin or soup. Progress to regular foods as tolerated. Avoid greasy, spicy, heavy foods. If nausea and/or vomiting occur, drink only clear liquids until the nausea and/or vomiting subsides. Call your physician if vomiting continues. ° °Special Instructions/Symptoms: °Your throat may feel dry or sore from the anesthesia or the breathing tube placed in your throat during surgery. If this causes discomfort, gargle with warm salt water. The discomfort should disappear within 24 hours. ° °

## 2013-12-27 NOTE — Anesthesia Procedure Notes (Signed)
Procedure Name: LMA Insertion Date/Time: 12/27/2013 9:36 AM Performed by: Melynda Ripple D Pre-anesthesia Checklist: Patient identified, Emergency Drugs available, Suction available and Patient being monitored Patient Re-evaluated:Patient Re-evaluated prior to inductionOxygen Delivery Method: Circle System Utilized Preoxygenation: Pre-oxygenation with 100% oxygen Intubation Type: IV induction Ventilation: Mask ventilation without difficulty LMA: LMA inserted LMA Size: 4.0 Number of attempts: 1 Airway Equipment and Method: bite block Placement Confirmation: positive ETCO2 Tube secured with: Tape Dental Injury: Teeth and Oropharynx as per pre-operative assessment

## 2013-12-27 NOTE — Transfer of Care (Signed)
Immediate Anesthesia Transfer of Care Note  Patient: Heidi Sexton  Procedure(s) Performed: Procedure(s): INSERTION PORT-A-CATH (Right)  Patient Location: PACU  Anesthesia Type:General  Level of Consciousness: awake, alert  and oriented  Airway & Oxygen Therapy: Patient Spontanous Breathing and Patient connected to face mask oxygen  Post-op Assessment: Report given to PACU RN and Post -op Vital signs reviewed and stable  Post vital signs: Reviewed and stable  Complications: No apparent anesthesia complications

## 2013-12-27 NOTE — Anesthesia Preprocedure Evaluation (Addendum)
Anesthesia Evaluation  Patient identified by MRN, date of birth, ID band Patient awake    Reviewed: Allergy & Precautions, H&P , NPO status , Patient's Chart, lab work & pertinent test results  History of Anesthesia Complications (+) PONV  Airway Mallampati: II  TM Distance: >3 FB Neck ROM: Full    Dental no notable dental hx. (+) Teeth Intact, Dental Advisory Given   Pulmonary neg pulmonary ROS, former smoker,  breath sounds clear to auscultation  Pulmonary exam normal       Cardiovascular Rhythm:Regular Rate:Normal     Neuro/Psych  Headaches, Anxiety    GI/Hepatic negative GI ROS, Neg liver ROS,   Endo/Other  negative endocrine ROS  Renal/GU negative Renal ROS  negative genitourinary   Musculoskeletal   Abdominal   Peds  Hematology negative hematology ROS (+)   Anesthesia Other Findings   Reproductive/Obstetrics negative OB ROS                            Anesthesia Physical Anesthesia Plan  ASA: II  Anesthesia Plan: General   Post-op Pain Management:    Induction: Intravenous  Airway Management Planned: LMA  Additional Equipment:   Intra-op Plan:   Post-operative Plan: Extubation in OR  Informed Consent: I have reviewed the patients History and Physical, chart, labs and discussed the procedure including the risks, benefits and alternatives for the proposed anesthesia with the patient or authorized representative who has indicated his/her understanding and acceptance.   Dental advisory given  Plan Discussed with: CRNA  Anesthesia Plan Comments:         Anesthesia Quick Evaluation

## 2013-12-27 NOTE — Op Note (Signed)
PREOPERATIVE DIAGNOSIS:  Right triple negative breast cancer pT1N0     POSTOPERATIVE DIAGNOSIS:  Same     PROCEDURE: Right subclavian port placement, Bard ClearVue  Power Port, MRI safe, 8-French.      SURGEON:  [name], MD      ANESTHESIA:  General   FINDINGS:  Good venous return, easy flush, and tip of the catheter and   SVC 20.5 cm.      SPECIMEN:  None.      ESTIMATED BLOOD LOSS:  Minimal.      COMPLICATIONS:  None known.      PROCEDURE:  Pt was identified in the holding area and taken to   the operating room, where patient was placed supine on the operating room   table.  General anesthesia was induced.  Patient's arms were tucked and the upper   chest and neck were prepped and draped in sterile fashion.  Time-out was   performed according to the surgical safety check list.  When all was   correct, we continued.   Local anesthetic was administered over this   area at the angle of the clavicle.  The vein was accessed with 2 passes of the needle. There was good venous return and the wire passed easily with ectopy that resolved with pulling back the wire a bit.   Fluoroscopy was used to confirm that the wire was in the vena cava.      The patient was placed back level and the area for the pocket was anethetized   with local anesthetic.  A 3-cm transverse incision was made with a #15   blade.  Cautery was used to divide the subcutaneous tissues down to the   pectoralis muscle.  An [employer] retractor was used to elevate the skin   while a pocket was created on top of the pectoralis fascia.  The port   was placed into the pocket to confirm that it was of adequate size.  The   catheter was preattached to the port.  The port was then secured to the   pectoralis fascia with four 2-0 Prolene sutures.  These were clamped and   not tied down yet.    The catheter was tunneled through to the wire exit   site.  The catheter was placed along the wire to determine what length it  should be to be in the SVC.  The catheter was cut at 20.5 cm.  The tunneler sheath and dilator were passed over the wire and the dilator and wire were removed.  The catheter was advanced through the tunneler sheath and the tunneler sheath was pulled away.  Care was taken to keep the catheter in the tunneler sheath as this occurred. This was advanced and the tunneler sheath was removed.  There was good venous   return and easy flush of the catheter.  The Prolene sutures were tied   down to the pectoral fascia.  The skin was reapproximated using 3-0   Vicryl interrupted deep dermal sutures.    Fluoroscopy was used to re-confirm good position of the catheter.  The skin   was then closed using 4-0 Monocryl in a subcuticular fashion.  The port was flushed with concentrated heparin flush as well.  The wounds were then cleaned, dried, and dressed with Dermabond.  The patient was awakened from anesthesia and taken to the PACU in stable condition.  Needle, sponge, and instrument counts were correct.  [name], MD

## 2013-12-27 NOTE — Anesthesia Postprocedure Evaluation (Signed)
  Anesthesia Post-op Note  Patient: Heidi Sexton  Procedure(s) Performed: Procedure(s): INSERTION PORT-A-CATH (Right)  Patient Location: PACU  Anesthesia Type:General  Level of Consciousness: awake and alert   Airway and Oxygen Therapy: Patient Spontanous Breathing  Post-op Pain: mild  Post-op Assessment: Post-op Vital signs reviewed, Patient's Cardiovascular Status Stable and Respiratory Function Stable  Post-op Vital Signs: Reviewed  Filed Vitals:   12/27/13 1145  BP: 145/70  Pulse: 63  Temp:   Resp: 21    Complications: No apparent anesthesia complications

## 2013-12-27 NOTE — H&P (Signed)
Heidi Sexton is an 53 y.o. female.   Chief Complaint: right breast cancer, BRCA HPI:  Pt is a 53 yo F with history of left breast cancer and BRCA, now with right breast cancer.  Needs chemo since triple negative.   Past Medical History  Diagnosis Date  . Heart murmur     during pregnancy only- then resolved   . Anxiety     h/o panic attacks - relative to migranie headaches   . PONV (postoperative nausea and vomiting)   . Family history of anesthesia complication     "daughter got nauseated also"  . Migraine     "maybe 2/yr" (11/16/2013)  . Breast cancer     "both breasts"  . Wears glasses     Past Surgical History  Procedure Laterality Date  . Portacath placement  1996  . Reconstruction breast w/ latissimus dorsi flap Right 11/16/2013    w/saline implants  . Mastectomy complete / simple w/ sentinel node biopsy Right 11/16/2013  . Mastectomy Left 1996  . Port-a-cath removal  1996  . Tunneled venous catheter placement  1996    "[facility]; removed later in 1996"  . Mastectomy w/ sentinel node biopsy Right 11/16/2013    Procedure: RIGHT MASTECTOMY WITH SENTINEL LYMPH NODE BIOPSY;  Surgeon: [name], MD;  Location: [place];  Service: General;  Laterality: Right;  . Latissimus flap to breast Right 11/16/2013    Procedure: LATISSIMUS FLAP TO BREAST WITH SALINE IMPLANTS;  Surgeon: Crissie Reese, MD;  Location: [facility];  Service: Plastics;  Laterality: Right;  . Abdominal hysterectomy  2011    bso  . Breast biopsy Left 1996  . Breast reconstruction Left 2001  . Breast biopsy Right 09/2013  . Breast reconstruction Right 11/16/2013    Procedure: BREAST RECONSTRUCTION;  Surgeon: Crissie Reese, MD;  Location: [place];  Service: Plastics;  Laterality: Right;  . Breast surgery  1996    left mast-axillary node dissection    Family History  Problem Relation Age of Onset  . Liver cancer Mother    Social History:  reports that she quit smoking about 20 years ago. Her smoking use included  Cigarettes. She has a 1.5 pack-year smoking history. She has never used smokeless tobacco. She reports that she drinks alcohol. She reports that she does not use illicit drugs.  Allergies:  Allergies  Allergen Reactions  . Adhesive [Tape] Hives  . Aleve [Naproxen] Itching  . Advil [Ibuprofen]     Itchy red face    Medications Prior to Admission  Medication Sig Dispense Refill  . Cholecalciferol (VITAMIN D) 2000 UNITS tablet Take 2,000 Units by mouth daily.    Marland Kitchen HYDROmorphone (DILAUDID) 2 MG tablet Take 1-2 tablets (2-4 mg total) by mouth every 4 (four) hours as needed for moderate pain. 40 tablet 0  . loratadine (CLARITIN) 10 MG tablet Take 10 mg by mouth daily as needed for allergies.    . methocarbamol (ROBAXIN) 500 MG tablet Take 1 tablet (500 mg total) by mouth every 6 (six) hours. 40 tablet 1  . Probiotic Product (SOLUBLE FIBER/PROBIOTICS PO) Take 1 tablet by mouth daily.    Marland Kitchen acetaminophen (TYLENOL) 500 MG tablet Take 500-1,000 mg by mouth every 6 (six) hours as needed.     Marland Kitchen acyclovir (ZOVIRAX) 200 MG capsule Take 1 capsule (200 mg total) by mouth 3 (three) times daily. To prevent shingles. 90 capsule 3  . dexamethasone (DECADRON) 4 MG tablet Take 2 tabs with food bid x  3 days begin day prior to taxotere 12 tablet 0  . lidocaine-prilocaine (EMLA) cream Apply @ 1 tsp to skin over port at least 1 hour before chemo treatment 30 g 2  . LORazepam (ATIVAN) 1 MG tablet Take 1 tablet (1 mg total) by mouth every 6 (six) hours as needed (prn nausea-Will make drowsy). 20 tablet 1  . ondansetron (ZOFRAN) 8 MG tablet Take 1 tablet (8 mg total) by mouth every 8 (eight) hours as needed for nausea or vomiting. Will not make drowsy. 30 tablet 2    Results for orders placed or performed during the hospital encounter of 12/27/13 (from the past 48 hour(s))  Hemoglobin-hemacue, POC     Status: Abnormal   Collection Time: 12/27/13  9:04 AM  Result Value Ref Range   Hemoglobin 10.7 (L) 12.0 - 15.0 g/dL    No results found.  Review of Systems  All other systems reviewed and are negative.   Blood pressure 127/70, pulse 58, temperature 97.7 F (36.5 C), temperature source Oral, resp. rate 20, height 5' 7.5" (1.715 m), weight 207 lb (93.895 kg), SpO2 100 %. Physical Exam  Constitutional: She is oriented to person, place, and time. She appears well-developed and well-nourished.  HENT:  Head: Normocephalic and atraumatic.  Eyes: Pupils are equal, round, and reactive to light.  Neck: Normal range of motion. Neck supple.  Cardiovascular: Normal rate and intact distal pulses.   Respiratory: Effort normal. No respiratory distress.  GI: Soft. She exhibits no distension.  Musculoskeletal: Normal range of motion.  Neurological: She is alert and oriented to person, place, and time.  Skin: Skin is warm and dry.  Psychiatric: She has a normal mood and affect. Her behavior is normal. Judgment and thought content normal.     Assessment/Plan Right triple negative breast cancer. Plan port a cath Reviewed risks and benefits.    Avon Molock 12/27/2013, 9:23 AM

## 2013-12-28 ENCOUNTER — Ambulatory Visit: Payer: 59

## 2013-12-28 ENCOUNTER — Encounter (HOSPITAL_BASED_OUTPATIENT_CLINIC_OR_DEPARTMENT_OTHER): Payer: Self-pay | Admitting: General Surgery

## 2013-12-28 ENCOUNTER — Ambulatory Visit (HOSPITAL_BASED_OUTPATIENT_CLINIC_OR_DEPARTMENT_OTHER): Payer: 59

## 2013-12-28 ENCOUNTER — Other Ambulatory Visit (HOSPITAL_BASED_OUTPATIENT_CLINIC_OR_DEPARTMENT_OTHER): Payer: 59

## 2013-12-28 DIAGNOSIS — C50411 Malignant neoplasm of upper-outer quadrant of right female breast: Secondary | ICD-10-CM

## 2013-12-28 DIAGNOSIS — Z5111 Encounter for antineoplastic chemotherapy: Secondary | ICD-10-CM

## 2013-12-28 DIAGNOSIS — Z95828 Presence of other vascular implants and grafts: Secondary | ICD-10-CM

## 2013-12-28 LAB — CBC WITH DIFFERENTIAL/PLATELET
BASO%: 0 % (ref 0.0–2.0)
Basophils Absolute: 0 10*3/uL (ref 0.0–0.1)
EOS%: 0 % (ref 0.0–7.0)
Eosinophils Absolute: 0 10*3/uL (ref 0.0–0.5)
HEMATOCRIT: 33.5 % — AB (ref 34.8–46.6)
HGB: 10.9 g/dL — ABNORMAL LOW (ref 11.6–15.9)
LYMPH%: 6 % — AB (ref 14.0–49.7)
MCH: 28.4 pg (ref 25.1–34.0)
MCHC: 32.5 g/dL (ref 31.5–36.0)
MCV: 87.2 fL (ref 79.5–101.0)
MONO#: 0.9 10*3/uL (ref 0.1–0.9)
MONO%: 6.8 % (ref 0.0–14.0)
NEUT#: 11.2 10*3/uL — ABNORMAL HIGH (ref 1.5–6.5)
NEUT%: 87.2 % — AB (ref 38.4–76.8)
Platelets: 295 10*3/uL (ref 145–400)
RBC: 3.84 10*6/uL (ref 3.70–5.45)
RDW: 14.6 % — ABNORMAL HIGH (ref 11.2–14.5)
WBC: 12.9 10*3/uL — ABNORMAL HIGH (ref 3.9–10.3)
lymph#: 0.8 10*3/uL — ABNORMAL LOW (ref 0.9–3.3)

## 2013-12-28 LAB — COMPREHENSIVE METABOLIC PANEL (CC13)
ALK PHOS: 66 U/L (ref 40–150)
ALT: 10 U/L (ref 0–55)
AST: 11 U/L (ref 5–34)
Albumin: 3.7 g/dL (ref 3.5–5.0)
Anion Gap: 4 mEq/L (ref 3–11)
BUN: 14.5 mg/dL (ref 7.0–26.0)
CO2: 23 mEq/L (ref 22–29)
Calcium: 9.2 mg/dL (ref 8.4–10.4)
Chloride: 110 mEq/L — ABNORMAL HIGH (ref 98–109)
Creatinine: 0.9 mg/dL (ref 0.6–1.1)
Glucose: 104 mg/dl (ref 70–140)
Potassium: 4.3 mEq/L (ref 3.5–5.1)
Sodium: 138 mEq/L (ref 136–145)
Total Bilirubin: 0.42 mg/dL (ref 0.20–1.20)
Total Protein: 7.2 g/dL (ref 6.4–8.3)

## 2013-12-28 MED ORDER — HEPARIN SOD (PORK) LOCK FLUSH 100 UNIT/ML IV SOLN
500.0000 [IU] | Freq: Once | INTRAVENOUS | Status: AC | PRN
  Administered 2013-12-28: 500 [IU]
  Filled 2013-12-28: qty 5

## 2013-12-28 MED ORDER — SODIUM CHLORIDE 0.9 % IV SOLN
680.0000 mg | Freq: Once | INTRAVENOUS | Status: AC
  Administered 2013-12-28: 680 mg via INTRAVENOUS
  Filled 2013-12-28: qty 68

## 2013-12-28 MED ORDER — DOCETAXEL CHEMO INJECTION 160 MG/16ML
75.0000 mg/m2 | Freq: Once | INTRAVENOUS | Status: AC
  Administered 2013-12-28: 160 mg via INTRAVENOUS
  Filled 2013-12-28: qty 16

## 2013-12-28 MED ORDER — DEXAMETHASONE SODIUM PHOSPHATE 20 MG/5ML IJ SOLN
20.0000 mg | Freq: Once | INTRAMUSCULAR | Status: AC
  Administered 2013-12-28: 20 mg via INTRAVENOUS

## 2013-12-28 MED ORDER — SODIUM CHLORIDE 0.9 % IV SOLN
Freq: Once | INTRAVENOUS | Status: AC
  Administered 2013-12-28: 12:00:00 via INTRAVENOUS

## 2013-12-28 MED ORDER — SODIUM CHLORIDE 0.9 % IJ SOLN
10.0000 mL | INTRAMUSCULAR | Status: DC | PRN
  Administered 2013-12-28: 10 mL via INTRAVENOUS
  Filled 2013-12-28: qty 10

## 2013-12-28 MED ORDER — ONDANSETRON 16 MG/50ML IVPB (CHCC)
INTRAVENOUS | Status: AC
  Filled 2013-12-28: qty 16

## 2013-12-28 MED ORDER — DEXAMETHASONE SODIUM PHOSPHATE 20 MG/5ML IJ SOLN
INTRAMUSCULAR | Status: AC
  Filled 2013-12-28: qty 5

## 2013-12-28 MED ORDER — SODIUM CHLORIDE 0.9 % IJ SOLN
10.0000 mL | INTRAMUSCULAR | Status: DC | PRN
  Administered 2013-12-28: 10 mL
  Filled 2013-12-28: qty 10

## 2013-12-28 MED ORDER — ONDANSETRON 16 MG/50ML IVPB (CHCC)
16.0000 mg | Freq: Once | INTRAVENOUS | Status: AC
  Administered 2013-12-28: 16 mg via INTRAVENOUS

## 2013-12-28 NOTE — Patient Instructions (Signed)

## 2013-12-28 NOTE — Patient Instructions (Signed)
[facility] Discharge Instructions for Patients Receiving Chemotherapy  Today you received the following chemotherapy agents: Taxotere and Carboplatin.  To help prevent nausea and vomiting after your treatment, we encourage you to take your nausea medication as prescribed.   If you develop nausea and vomiting that is not controlled by your nausea medication, call the clinic.   BELOW ARE SYMPTOMS THAT SHOULD BE REPORTED IMMEDIATELY:  *FEVER GREATER THAN 100.5 F  *CHILLS WITH OR WITHOUT FEVER  NAUSEA AND VOMITING THAT IS NOT CONTROLLED WITH YOUR NAUSEA MEDICATION  *UNUSUAL SHORTNESS OF BREATH  *UNUSUAL BRUISING OR BLEEDING  TENDERNESS IN MOUTH AND THROAT WITH OR WITHOUT PRESENCE OF ULCERS  *URINARY PROBLEMS  *BOWEL PROBLEMS  UNUSUAL RASH Items with * indicate a potential emergency and should be followed up as soon as possible.  Feel free to call the clinic you have any questions or concerns. The clinic phone number is [phone].   Docetaxel injection What is this medicine? DOCETAXEL (doe se TAX el) is a chemotherapy drug. It targets fast dividing cells, like cancer cells, and causes these cells to die. This medicine is used to treat many types of cancers like breast cancer, certain stomach cancers, head and neck cancer, lung cancer, and prostate cancer. This medicine may be used for other purposes; ask your health care provider or pharmacist if you have questions. COMMON BRAND NAME(S): Docefrez, Taxotere What should I tell my health care provider before I take this medicine? They need to know if you have any of these conditions: -infection (especially a virus infection such as chickenpox, cold sores, or herpes) -liver disease -low blood counts, like low white cell, platelet, or red cell counts -an unusual or allergic reaction to docetaxel, polysorbate 80, other chemotherapy agents, other medicines, foods, dyes, or preservatives -pregnant or trying to get  pregnant -breast-feeding How should I use this medicine? This drug is given as an infusion into a vein. It is administered in a hospital or clinic by a specially trained health care professional. Talk to your pediatrician regarding the use of this medicine in children. Special care may be needed. Overdosage: If you think you have taken too much of this medicine contact a poison control center or emergency room at once. NOTE: This medicine is only for you. Do not share this medicine with others. What if I miss a dose? It is important not to miss your dose. Call your doctor or health care professional if you are unable to keep an appointment. What may interact with this medicine? -cyclosporine -erythromycin -ketoconazole -medicines to increase blood counts like filgrastim, pegfilgrastim, sargramostim -vaccines Talk to your doctor or health care professional before taking any of these medicines: -acetaminophen -aspirin -ibuprofen -ketoprofen -naproxen This list may not describe all possible interactions. Give your health care provider a list of all the medicines, herbs, non-prescription drugs, or dietary supplements you use. Also tell them if you smoke, drink alcohol, or use illegal drugs. Some items may interact with your medicine. What should I watch for while using this medicine? Your condition will be monitored carefully while you are receiving this medicine. You will need important blood work done while you are taking this medicine. This drug may make you feel generally unwell. This is not uncommon, as chemotherapy can affect healthy cells as well as cancer cells. Report any side effects. Continue your course of treatment even though you feel ill unless your doctor tells you to stop. In some cases, you may be given additional  medicines to help with side effects. Follow all directions for their use. Call your doctor or health care professional for advice if you get a fever, chills or sore  throat, or other symptoms of a cold or flu. Do not treat yourself. This drug decreases your body's ability to fight infections. Try to avoid being around people who are sick. This medicine may increase your risk to bruise or bleed. Call your doctor or health care professional if you notice any unusual bleeding. Be careful brushing and flossing your teeth or using a toothpick because you may get an infection or bleed more easily. If you have any dental work done, tell your dentist you are receiving this medicine. Avoid taking products that contain aspirin, acetaminophen, ibuprofen, naproxen, or ketoprofen unless instructed by your doctor. These medicines may hide a fever. This medicine contains an alcohol in the product. You may get drowsy or dizzy. Do not drive, use machinery, or do anything that needs mental alertness until you know how this medicine affects you. Do not stand or sit up quickly, especially if you are an older patient. This reduces the risk of dizzy or fainting spells. Avoid alcoholic drinks Do not become pregnant while taking this medicine. Women should inform their doctor if they wish to become pregnant or think they might be pregnant. There is a potential for serious side effects to an unborn child. Talk to your health care professional or pharmacist for more information. Do not breast-feed an infant while taking this medicine. What side effects may I notice from receiving this medicine? Side effects that you should report to your doctor or health care professional as soon as possible: -allergic reactions like skin rash, itching or hives, swelling of the face, lips, or tongue -low blood counts - This drug may decrease the number of white blood cells, red blood cells and platelets. You may be at increased risk for infections and bleeding. -signs of infection - fever or chills, cough, sore throat, pain or difficulty passing urine -signs of decreased platelets or bleeding - bruising,  pinpoint red spots on the skin, black, tarry stools, nosebleeds -signs of decreased red blood cells - unusually weak or tired, fainting spells, lightheadedness -breathing problems -fast or irregular heartbeat -low blood pressure -mouth sores -nausea and vomiting -pain, swelling, redness or irritation at the injection site -pain, tingling, numbness in the hands or feet -swelling of the ankle, feet, hands -weight gain Side effects that usually do not require medical attention (report to your prescriber or health care professional if they continue or are bothersome): -bone pain -complete hair loss including hair on your head, underarms, pubic hair, eyebrows, and eyelashes -diarrhea -excessive tearing -changes in the color of fingernails -loosening of the fingernails -nausea -muscle pain -red flush to skin -sweating -weak or tired This list may not describe all possible side effects. Call your doctor for medical advice about side effects. You may report side effects to FDA at 1-800-FDA-1088. Where should I keep my medicine? This drug is given in a hospital or clinic and will not be stored at home. NOTE: This sheet is a summary. It may not cover all possible information. If you have questions about this medicine, talk to your doctor, pharmacist, or health care provider.  2015, [employer]. (2012-12-31 22:21:02) Carboplatin injection What is this medicine? CARBOPLATIN (KAR boe pla tin) is a chemotherapy drug. It targets fast dividing cells, like cancer cells, and causes these cells to die. This medicine is used to treat ovarian  cancer and many other cancers. This medicine may be used for other purposes; ask your health care provider or pharmacist if you have questions. COMMON BRAND NAME(S): Paraplatin What should I tell my health care provider before I take this medicine? They need to know if you have any of these conditions: -blood disorders -hearing problems -kidney  disease -recent or ongoing radiation therapy -an unusual or allergic reaction to carboplatin, cisplatin, other chemotherapy, other medicines, foods, dyes, or preservatives -pregnant or trying to get pregnant -breast-feeding How should I use this medicine? This drug is usually given as an infusion into a vein. It is administered in a hospital or clinic by a specially trained health care professional. Talk to your pediatrician regarding the use of this medicine in children. Special care may be needed. Overdosage: If you think you have taken too much of this medicine contact a poison control center or emergency room at once. NOTE: This medicine is only for you. Do not share this medicine with others. What if I miss a dose? It is important not to miss a dose. Call your doctor or health care professional if you are unable to keep an appointment. What may interact with this medicine? -medicines for seizures -medicines to increase blood counts like filgrastim, pegfilgrastim, sargramostim -some antibiotics like amikacin, gentamicin, neomycin, streptomycin, tobramycin -vaccines Talk to your doctor or health care professional before taking any of these medicines: -acetaminophen -aspirin -ibuprofen -ketoprofen -naproxen This list may not describe all possible interactions. Give your health care provider a list of all the medicines, herbs, non-prescription drugs, or dietary supplements you use. Also tell them if you smoke, drink alcohol, or use illegal drugs. Some items may interact with your medicine. What should I watch for while using this medicine? Your condition will be monitored carefully while you are receiving this medicine. You will need important blood work done while you are taking this medicine. This drug may make you feel generally unwell. This is not uncommon, as chemotherapy can affect healthy cells as well as cancer cells. Report any side effects. Continue your course of treatment even  though you feel ill unless your doctor tells you to stop. In some cases, you may be given additional medicines to help with side effects. Follow all directions for their use. Call your doctor or health care professional for advice if you get a fever, chills or sore throat, or other symptoms of a cold or flu. Do not treat yourself. This drug decreases your body's ability to fight infections. Try to avoid being around people who are sick. This medicine may increase your risk to bruise or bleed. Call your doctor or health care professional if you notice any unusual bleeding. Be careful brushing and flossing your teeth or using a toothpick because you may get an infection or bleed more easily. If you have any dental work done, tell your dentist you are receiving this medicine. Avoid taking products that contain aspirin, acetaminophen, ibuprofen, naproxen, or ketoprofen unless instructed by your doctor. These medicines may hide a fever. Do not become pregnant while taking this medicine. Women should inform their doctor if they wish to become pregnant or think they might be pregnant. There is a potential for serious side effects to an unborn child. Talk to your health care professional or pharmacist for more information. Do not breast-feed an infant while taking this medicine. What side effects may I notice from receiving this medicine? Side effects that you should report to your doctor or health care  professional as soon as possible: -allergic reactions like skin rash, itching or hives, swelling of the face, lips, or tongue -signs of infection - fever or chills, cough, sore throat, pain or difficulty passing urine -signs of decreased platelets or bleeding - bruising, pinpoint red spots on the skin, black, tarry stools, nosebleeds -signs of decreased red blood cells - unusually weak or tired, fainting spells, lightheadedness -breathing problems -changes in hearing -changes in vision -chest pain -high  blood pressure -low blood counts - This drug may decrease the number of white blood cells, red blood cells and platelets. You may be at increased risk for infections and bleeding. -nausea and vomiting -pain, swelling, redness or irritation at the injection site -pain, tingling, numbness in the hands or feet -problems with balance, talking, walking -trouble passing urine or change in the amount of urine Side effects that usually do not require medical attention (report to your doctor or health care professional if they continue or are bothersome): -hair loss -loss of appetite -metallic taste in the mouth or changes in taste This list may not describe all possible side effects. Call your doctor for medical advice about side effects. You may report side effects to FDA at 1-800-FDA-1088. Where should I keep my medicine? This drug is given in a hospital or clinic and will not be stored at home. NOTE: This sheet is a summary. It may not cover all possible information. If you have questions about this medicine, talk to your doctor, pharmacist, or health care provider.  2015, [employer] Standard. (2007-05-12 14:38:05)

## 2013-12-29 ENCOUNTER — Ambulatory Visit (HOSPITAL_BASED_OUTPATIENT_CLINIC_OR_DEPARTMENT_OTHER): Payer: 59

## 2013-12-29 ENCOUNTER — Telehealth: Payer: Self-pay

## 2013-12-29 DIAGNOSIS — C50411 Malignant neoplasm of upper-outer quadrant of right female breast: Secondary | ICD-10-CM

## 2013-12-29 DIAGNOSIS — Z5189 Encounter for other specified aftercare: Secondary | ICD-10-CM

## 2013-12-29 MED ORDER — PEGFILGRASTIM INJECTION 6 MG/0.6ML ~~LOC~~
6.0000 mg | PREFILLED_SYRINGE | Freq: Once | SUBCUTANEOUS | Status: AC
  Administered 2013-12-29: 6 mg via SUBCUTANEOUS
  Filled 2013-12-29: qty 0.6

## 2013-12-29 NOTE — Patient Instructions (Signed)
Pegfilgrastim injection What is this medicine? PEGFILGRASTIM (peg fil GRA stim) is a long-acting granulocyte colony-stimulating factor that stimulates the growth of neutrophils, a type of white blood cell important in the body's fight against infection. It is used to reduce the incidence of fever and infection in patients with certain types of cancer who are receiving chemotherapy that affects the bone marrow. This medicine may be used for other purposes; ask your health care provider or pharmacist if you have questions. COMMON BRAND NAME(S): Neulasta What should I tell my health care provider before I take this medicine? They need to know if you have any of these conditions: -latex allergy -ongoing radiation therapy -sickle cell disease -skin reactions to acrylic adhesives (On-Body Injector only) -an unusual or allergic reaction to pegfilgrastim, filgrastim, other medicines, foods, dyes, or preservatives -pregnant or trying to get pregnant -breast-feeding How should I use this medicine? This medicine is for injection under the skin. If you get this medicine at home, you will be taught how to prepare and give the pre-filled syringe or how to use the On-body Injector. Refer to the patient Instructions for Use for detailed instructions. Use exactly as directed. Take your medicine at regular intervals. Do not take your medicine more often than directed. It is important that you put your used needles and syringes in a special sharps container. Do not put them in a trash can. If you do not have a sharps container, call your pharmacist or healthcare provider to get one. Talk to your pediatrician regarding the use of this medicine in children. Special care may be needed. Overdosage: If you think you have taken too much of this medicine contact a poison control center or emergency room at once. NOTE: This medicine is only for you. Do not share this medicine with others. What if I miss a dose? It is  important not to miss your dose. Call your doctor or health care professional if you miss your dose. If you miss a dose due to an On-body Injector failure or leakage, a new dose should be administered as soon as possible using a single prefilled syringe for manual use. What may interact with this medicine? Interactions have not been studied. Give your health care provider a list of all the medicines, herbs, non-prescription drugs, or dietary supplements you use. Also tell them if you smoke, drink alcohol, or use illegal drugs. Some items may interact with your medicine. This list may not describe all possible interactions. Give your health care provider a list of all the medicines, herbs, non-prescription drugs, or dietary supplements you use. Also tell them if you smoke, drink alcohol, or use illegal drugs. Some items may interact with your medicine. What should I watch for while using this medicine? You may need blood work done while you are taking this medicine. If you are going to need a MRI, CT scan, or other procedure, tell your doctor that you are using this medicine (On-Body Injector only). What side effects may I notice from receiving this medicine? Side effects that you should report to your doctor or health care professional as soon as possible: -allergic reactions like skin rash, itching or hives, swelling of the face, lips, or tongue -dizziness -fever -pain, redness, or irritation at site where injected -pinpoint red spots on the skin -shortness of breath or breathing problems -stomach or side pain, or pain at the shoulder -swelling -tiredness -trouble passing urine Side effects that usually do not require medical attention (report to your doctor   or health care professional if they continue or are bothersome): -bone pain -muscle pain This list may not describe all possible side effects. Call your doctor for medical advice about side effects. You may report side effects to FDA at  1-800-FDA-1088. Where should I keep my medicine? Keep out of the reach of children. Store pre-filled syringes in a refrigerator between 2 and 8 degrees C (36 and 46 degrees F). Do not freeze. Keep in carton to protect from light. Throw away this medicine if it is left out of the refrigerator for more than 48 hours. Throw away any unused medicine after the expiration date. NOTE: This sheet is a summary. It may not cover all possible information. If you have questions about this medicine, talk to your doctor, pharmacist, or health care provider.  2015, [employer]. (2013-05-06 16:14:05)  

## 2013-12-29 NOTE — Telephone Encounter (Signed)
Patient here for neulasta injection after first chemo treatment yesterday.  States she is "slightly nauseated" with no vomiting, no fever, pain, mouth sores, constipation, or diarrhea.  Patient c/o some fatigue.  Patient states her Dr told her to take claritin to help with neulasta side effects which she is already taking daily. Aware she should call with a fever >100.5 or any other questions or concerns.

## 2014-01-02 ENCOUNTER — Other Ambulatory Visit: Payer: Self-pay | Admitting: Oncology

## 2014-01-02 DIAGNOSIS — C50411 Malignant neoplasm of upper-outer quadrant of right female breast: Secondary | ICD-10-CM

## 2014-01-03 ENCOUNTER — Other Ambulatory Visit: Payer: 59

## 2014-01-03 ENCOUNTER — Encounter: Payer: Self-pay | Admitting: Oncology

## 2014-01-03 ENCOUNTER — Ambulatory Visit (HOSPITAL_BASED_OUTPATIENT_CLINIC_OR_DEPARTMENT_OTHER): Payer: 59

## 2014-01-03 ENCOUNTER — Other Ambulatory Visit (HOSPITAL_BASED_OUTPATIENT_CLINIC_OR_DEPARTMENT_OTHER): Payer: 59

## 2014-01-03 ENCOUNTER — Ambulatory Visit (HOSPITAL_BASED_OUTPATIENT_CLINIC_OR_DEPARTMENT_OTHER): Payer: 59 | Admitting: Oncology

## 2014-01-03 VITALS — BP 115/86 | HR 112 | Temp 98.6°F | Resp 18 | Ht 67.5 in | Wt 198.9 lb

## 2014-01-03 DIAGNOSIS — Z95828 Presence of other vascular implants and grafts: Secondary | ICD-10-CM

## 2014-01-03 DIAGNOSIS — C50411 Malignant neoplasm of upper-outer quadrant of right female breast: Secondary | ICD-10-CM

## 2014-01-03 LAB — COMPREHENSIVE METABOLIC PANEL (CC13)
ALT: 20 U/L (ref 0–55)
ANION GAP: 11 meq/L (ref 3–11)
AST: 13 U/L (ref 5–34)
Albumin: 3.9 g/dL (ref 3.5–5.0)
Alkaline Phosphatase: 73 U/L (ref 40–150)
BUN: 11.8 mg/dL (ref 7.0–26.0)
CALCIUM: 9.6 mg/dL (ref 8.4–10.4)
CHLORIDE: 100 meq/L (ref 98–109)
CO2: 23 mEq/L (ref 22–29)
CREATININE: 0.9 mg/dL (ref 0.6–1.1)
Glucose: 104 mg/dl (ref 70–140)
Potassium: 3.7 mEq/L (ref 3.5–5.1)
Sodium: 134 mEq/L — ABNORMAL LOW (ref 136–145)
Total Bilirubin: 0.75 mg/dL (ref 0.20–1.20)
Total Protein: 7.5 g/dL (ref 6.4–8.3)

## 2014-01-03 LAB — CBC WITH DIFFERENTIAL/PLATELET
BASO%: 0.3 % (ref 0.0–2.0)
BASOS ABS: 0 10*3/uL (ref 0.0–0.1)
EOS%: 0.9 % (ref 0.0–7.0)
Eosinophils Absolute: 0.1 10*3/uL (ref 0.0–0.5)
HEMATOCRIT: 39.1 % (ref 34.8–46.6)
HGB: 12.5 g/dL (ref 11.6–15.9)
LYMPH%: 30.6 % (ref 14.0–49.7)
MCH: 27.7 pg (ref 25.1–34.0)
MCHC: 31.9 g/dL (ref 31.5–36.0)
MCV: 86.9 fL (ref 79.5–101.0)
MONO#: 1.4 10*3/uL — ABNORMAL HIGH (ref 0.1–0.9)
MONO%: 19.2 % — ABNORMAL HIGH (ref 0.0–14.0)
NEUT#: 3.5 10*3/uL (ref 1.5–6.5)
NEUT%: 49 % (ref 38.4–76.8)
Platelets: 213 10*3/uL (ref 145–400)
RBC: 4.49 10*6/uL (ref 3.70–5.45)
RDW: 14.2 % (ref 11.2–14.5)
WBC: 7.1 10*3/uL (ref 3.9–10.3)
lymph#: 2.2 10*3/uL (ref 0.9–3.3)

## 2014-01-03 MED ORDER — SODIUM CHLORIDE 0.9 % IJ SOLN
10.0000 mL | INTRAMUSCULAR | Status: DC | PRN
  Administered 2014-01-03: 10 mL via INTRAVENOUS
  Filled 2014-01-03: qty 10

## 2014-01-03 MED ORDER — METHOCARBAMOL 500 MG PO TABS: 500.0000 mg | ORAL_TABLET | Freq: Four times a day (QID) | ORAL | Status: DC | PRN

## 2014-01-03 MED ORDER — HEPARIN SOD (PORK) LOCK FLUSH 100 UNIT/ML IV SOLN
500.0000 [IU] | Freq: Once | INTRAVENOUS | Status: AC
  Administered 2014-01-03: 500 [IU] via INTRAVENOUS
  Filled 2014-01-03: qty 5

## 2014-01-03 NOTE — Patient Instructions (Signed)

## 2014-01-03 NOTE — Progress Notes (Signed)
OFFICE PROGRESS NOTE   01/03/2014   Physicians:F.BYerly, D.Harlow Mares, K.Winifred Olive (GYN), S.Squire  INTERVAL HISTORY:  Patient is seen, together with husband, in continuing attention to triple negative right breast cancer, having begun adjuvant chemotherapy with cycle 1 carboplatin taxotere on 12-28-13, with neulasta on 12-29-13. She had PAC placed by Dr Barry Dienes prior to first chemo.  Heidi Sexton has had a difficult time since treatment, especially with bone pain low back, legs, sternum after neulasta, despite claritin; robaxin for muscle spasms related to recent surgery not really helpful with neulasta pain. Pain has been severe to point that she is in [facility] today. She has dilaudid 2 mg available, which she will try today. She had nausea and diarrhea after eating at restaurant, both resolved after 24 hours. She had abdominal pain with the diarrhea, resolved now. She complains of pain in TMJ area R>L with chewing, with pain also right ear. PAC functioned well.   PAC as above Flu vaccine done   ONCOLOGIC HISTORY History is of left breast cancer in June 1996, when she was [redacted] weeks pregnant. This was 4x4x2.5 cm poorly differentiated invasive ductal and comedo carciinoma, ER PR negative, HER 2 not tested in 1996. She was treated with 2 cycles of adriamycin at 60 mg/m2 in [place] as she completed the pregnancy, then had left mastectomy with 12 of 23 left axillary nodes positive. She had additional adriamycin and cytoxan in [place] to total adriamycin dose of 300 mg/m2, followed by high dose chemotherapy with cytoxan, CDDP and autologous transplant on protocol at [place] (Dr J.Vredenburgh) , and local radiation on left. We have received treatment records from [facility] for high dose chemotherapy and autologous transplant done 1996, which I have reviewed and which will be scanned into this EMR. She had left TRAM reconstruction. She had multiple BRCA 1 and 2 variants at last genetics testing. She had  hysterectomy with oophorectomy ~ 08-2009, but was reluctant to proceed with prophylactic right mastectomy. She was followed with mammograms + yearly MRIs due to BRCA abnormalities. Daughter Heidi Sexton is healthy, age 43. Right breast cancer was identified on tomo mammography at [facility] 09-24-13, with heterogeneously dense breast tissue and suspicious area upper outer quadrant. She had MRI 09-27-13 with 9x5x6 mm mass upper outer quadrant on right, otherwise unremarkable bilaterally. US biopsy at [facility] 10-04-13 [phone]) documented invasive mammary carcinoma ER PR and HER 2 negative and proliferation fraction 87%. After consultation with general surgery, plastic surgery, medical oncology and radiation oncology, she agreed to right mastectomy. She had staging CT CAP 10-13-13, unremarkable other than 5 mm low density lesion in dome of right hepatic lobe likely cyst and scarring left lung apex likely from prior RT.She had right mastectomy with sentinel node evaluation and immediate reconstruction with right latissimus myocutaneous flap + saline implant by Drs Barry Dienes and Harlow Mares on 11-16-13. [place] pathology [id number] from 11-16-13 had grade 3 invasive ductal carcinoma 0.6 cm with 3 sentinel axillary nodes + 1 nonsentinel node negative, closest margin 1 cm, no LVSI, repeat ER and PR testing both 0% and not sufficient tumor for repeat HER 2 (which had been negative on diagnostic biopsy 09-30-13 [phone]).   Review of systems as above, also: No fever or symptoms of infection. No bleeding. No skin rash. Bowels ok since diarrhea resolved. Voiding ok. No different symptoms from breast reconstruction. Remainder of 10 point Review of Systems negative.  Objective:  Vital signs in last 24 hours:  BP 115/86 mmHg  Pulse 112  Temp(Src) 98.6  F (37 C) (Oral)  Resp 18  Ht 5' 7.5" (1.715 m)  Wt 198 lb 14.4 oz (90.22 kg)  BMI 30.67 kg/m2 weight down 8 lbs from 12-27-13.  Alert, oriented, tearful at times  describing problems since chemo. In [place]. Respirations not labored RA.   HEENT:PERRL, sclerae not icteric. Oral mucosa moist without lesions, posterior pharynx clear.  Neck supple. No JVD.  Lymphatics:no cervical,suraclavicular adenopathy Resp: clear to auscultation bilaterally  Cardio: regular rate and rhythm. No gallop. GI: soft, nontender, not distended, no mass or organomegaly. Some bowel sounds.  Musculoskeletal/ Extremities: without pitting edema, cords, tenderness. Flap area right back incisions closed, no increased tenderness, no erythema. Neuro: no peripheral neuropathy. Otherwise nonfocal Skin without rash, ecchymosis, petechiae Breasts: right reconstruction incisions all closed, no erythema or tenderness. Left reconstruction stable.  Portacath- minimal ecchymosis, good position, not tender.  Lab Results:  Results for orders placed or performed in visit on 01/03/14  CBC with Differential  Result Value Ref Range   WBC 7.1 3.9 - 10.3 10e3/uL   NEUT# 3.5 1.5 - 6.5 10e3/uL   HGB 12.5 11.6 - 15.9 g/dL   HCT 39.1 34.8 - 46.6 %   Platelets 213 145 - 400 10e3/uL   MCV 86.9 79.5 - 101.0 fL   MCH 27.7 25.1 - 34.0 pg   MCHC 31.9 31.5 - 36.0 g/dL   RBC 4.49 3.70 - 5.45 10e6/uL   RDW 14.2 11.2 - 14.5 %   lymph# 2.2 0.9 - 3.3 10e3/uL   MONO# 1.4 (H) 0.1 - 0.9 10e3/uL   Eosinophils Absolute 0.1 0.0 - 0.5 10e3/uL   Basophils Absolute 0.0 0.0 - 0.1 10e3/uL   NEUT% 49.0 38.4 - 76.8 %   LYMPH% 30.6 14.0 - 49.7 %   MONO% 19.2 (H) 0.0 - 14.0 %   EOS% 0.9 0.0 - 7.0 %   BASO% 0.3 0.0 - 2.0 %  Comprehensive metabolic panel (Cmet) - [facility]  Result Value Ref Range   Sodium 134 (L) 136 - 145 mEq/L   Potassium 3.7 3.5 - 5.1 mEq/L   Chloride 100 98 - 109 mEq/L   CO2 23 22 - 29 mEq/L   Glucose 104 70 - 140 mg/dl   BUN 11.8 7.0 - 26.0 mg/dL   Creatinine 0.9 0.6 - 1.1 mg/dL   Total Bilirubin 0.75 0.20 - 1.20 mg/dL   Alkaline Phosphatase 73 40 - 150 U/L   AST 13 5 - 34 U/L   ALT 20 0 - 55 U/L    Total Protein 7.5 6.4 - 8.3 g/dL   Albumin 3.9 3.5 - 5.0 g/dL   Calcium 9.6 8.4 - 10.4 mg/dL   Anion Gap 11 3 - 11 mEq/L   CBC discussed as above. ANC may drop in next week even with neulasta, but hopefully will recover quickly if so.  Studies/Results:  No results found.  Medications: I have reviewed the patient's current medications. She is on prophylactic acyclovir due to history of zoster during chemo 1996. She did take decadron as directed around taxotere. She will try dilaudid for neulasta aches today.  DISCUSSION: All of concerns above discussed. Patient reassured by conversation, husband very supportive. I have asked her to call my RN in next couple of days to let us know how she is. I will see her 01-10-14 with labs.  Assessment/Plan: 1.Triple negative right breast cancer 0.6 cm, 4 nodes negative: post right mastectomy with axillary node evaluation and immediate reconstruction with latissimus flap 11-16-13. Plan adjuvant chemotherapy  based on size of triple negative tumor, with carbo taxotere followed by weekly taxol. Cycle 1 carbo taxotere 12-28-13 with neulasta on 97-74-14, complicated by severe neulasta aches not yet resolved, plus GI symptoms possibly not related to chemo.  Next MD 01-10-14, following counts closely with prior treatment history. 2.left breast cancer 1996: 4 cm primary, 12 nodes involved, ER PR negative (HER 2 not tested in 1996), diagnosed when 7 months pregnant, treated with adriamycin prior to delivery, then [facility] to total adria dose 300 mg/m2, then high dose therapy with cytoxan, CDDP, carmustine and autologous BMT at [facility], then RT. No known recurrent disease. Left TRAM reconstruction. Note Dr Isidore Moos had planned to obtain RT records from 1996 to be sure these are available in present EMR. 3.multiple BRCA 1 and 2 variants 4. PAC done  by Dr Barry Dienes  5.zoster shortly after completion of treatment 1996: prophylaxis with acyclovir due to steroids with upcoming  chemo 6.flu vaccine done   Patient and husband understand discussion and are in agreement with plan above. RN aware that patient is to follow up by phone this week. Time spent 25 min including >50% counseling and coordination of care   LIVESAY,LENNIS P, MD   01/03/2014, 4:02 PM

## 2014-01-06 ENCOUNTER — Telehealth: Payer: Self-pay

## 2014-01-06 NOTE — Telephone Encounter (Signed)
Heidi Sexton states that she is feeling less achy then on 01-03-14.  Suggested she try a warm bath for aches and or heating pad. She drank some lemonade that that helped resolve the pain in the TMJ area. She has occasional nausea and will take the nausea med if the nausea becomes severe enough.  Suggested she take it when symptoms start and she may get better relief.   She has 1 blister on her tongue.  She is using biotene and salt water rinses.  The blister  is not interfering with her eating. [name] appreciated the follow up phone call.

## 2014-01-09 ENCOUNTER — Other Ambulatory Visit: Payer: Self-pay | Admitting: Oncology

## 2014-01-10 ENCOUNTER — Ambulatory Visit (HOSPITAL_BASED_OUTPATIENT_CLINIC_OR_DEPARTMENT_OTHER): Payer: 59

## 2014-01-10 ENCOUNTER — Encounter: Payer: Self-pay | Admitting: Oncology

## 2014-01-10 ENCOUNTER — Other Ambulatory Visit (HOSPITAL_BASED_OUTPATIENT_CLINIC_OR_DEPARTMENT_OTHER): Payer: 59

## 2014-01-10 ENCOUNTER — Other Ambulatory Visit: Payer: 59

## 2014-01-10 ENCOUNTER — Ambulatory Visit (HOSPITAL_BASED_OUTPATIENT_CLINIC_OR_DEPARTMENT_OTHER): Payer: 59 | Admitting: Oncology

## 2014-01-10 ENCOUNTER — Telehealth: Payer: Self-pay | Admitting: Oncology

## 2014-01-10 VITALS — BP 137/92 | Temp 98.6°F | Resp 20 | Ht 67.5 in | Wt 204.1 lb

## 2014-01-10 DIAGNOSIS — C50411 Malignant neoplasm of upper-outer quadrant of right female breast: Secondary | ICD-10-CM

## 2014-01-10 DIAGNOSIS — Z853 Personal history of malignant neoplasm of breast: Secondary | ICD-10-CM

## 2014-01-10 DIAGNOSIS — Z95828 Presence of other vascular implants and grafts: Secondary | ICD-10-CM

## 2014-01-10 DIAGNOSIS — C50912 Malignant neoplasm of unspecified site of left female breast: Secondary | ICD-10-CM

## 2014-01-10 LAB — COMPREHENSIVE METABOLIC PANEL (CC13)
ALT: 21 U/L (ref 0–55)
AST: 13 U/L (ref 5–34)
Albumin: 3.6 g/dL (ref 3.5–5.0)
Alkaline Phosphatase: 91 U/L (ref 40–150)
Anion Gap: 12 mEq/L — ABNORMAL HIGH (ref 3–11)
BUN: 10.4 mg/dL (ref 7.0–26.0)
CALCIUM: 9.3 mg/dL (ref 8.4–10.4)
CHLORIDE: 103 meq/L (ref 98–109)
CO2: 23 meq/L (ref 22–29)
Creatinine: 0.8 mg/dL (ref 0.6–1.1)
Glucose: 172 mg/dl — ABNORMAL HIGH (ref 70–140)
Potassium: 4 mEq/L (ref 3.5–5.1)
Sodium: 138 mEq/L (ref 136–145)
TOTAL PROTEIN: 6.9 g/dL (ref 6.4–8.3)
Total Bilirubin: 0.37 mg/dL (ref 0.20–1.20)

## 2014-01-10 LAB — CBC WITH DIFFERENTIAL/PLATELET
BASO%: 0.4 % (ref 0.0–2.0)
BASOS ABS: 0 10*3/uL (ref 0.0–0.1)
EOS ABS: 0 10*3/uL (ref 0.0–0.5)
EOS%: 0.1 % (ref 0.0–7.0)
HCT: 32.9 % — ABNORMAL LOW (ref 34.8–46.6)
HEMOGLOBIN: 10.5 g/dL — AB (ref 11.6–15.9)
LYMPH%: 26.2 % (ref 14.0–49.7)
MCH: 27.8 pg (ref 25.1–34.0)
MCHC: 31.8 g/dL (ref 31.5–36.0)
MCV: 87.4 fL (ref 79.5–101.0)
MONO#: 0.4 10*3/uL (ref 0.1–0.9)
MONO%: 5.9 % (ref 0.0–14.0)
NEUT%: 67.4 % (ref 38.4–76.8)
NEUTROS ABS: 4 10*3/uL (ref 1.5–6.5)
Platelets: 213 10*3/uL (ref 145–400)
RBC: 3.77 10*6/uL (ref 3.70–5.45)
RDW: 14.5 % (ref 11.2–14.5)
WBC: 6 10*3/uL (ref 3.9–10.3)
lymph#: 1.6 10*3/uL (ref 0.9–3.3)

## 2014-01-10 MED ORDER — HYDROMORPHONE HCL 2 MG PO TABS: 2.0000 mg | ORAL_TABLET | ORAL | Status: DC | PRN

## 2014-01-10 MED ORDER — DEXAMETHASONE 4 MG PO TABS: ORAL_TABLET | ORAL | Status: DC

## 2014-01-10 MED ORDER — SODIUM CHLORIDE 0.9 % IJ SOLN
10.0000 mL | INTRAMUSCULAR | Status: DC | PRN
  Administered 2014-01-10: 10 mL via INTRAVENOUS
  Filled 2014-01-10: qty 10

## 2014-01-10 MED ORDER — HEPARIN SOD (PORK) LOCK FLUSH 100 UNIT/ML IV SOLN
500.0000 [IU] | Freq: Once | INTRAVENOUS | Status: AC
  Administered 2014-01-10: 500 [IU] via INTRAVENOUS
  Filled 2014-01-10: qty 5

## 2014-01-10 NOTE — Patient Instructions (Signed)

## 2014-01-10 NOTE — Progress Notes (Signed)
OFFICE PROGRESS NOTE   01/10/2014   Physicians:F.Barry Dienes, D.Harlow Mares, K.Winifred Olive (GYN), S.Squire  INTERVAL HISTORY:  Patient is seen, together with husband, in scheduled follow up of adjuvant chemotherapy underway for triple negative right breast cancer, cycle 1 carboplatin taxotere given 12-28-13 with neulasta on 12-29-13.  Plan is 4 cycles of carbo taxotere then 12 weeks of taxol.  Heidi Sexton has felt much better in past week, with 2 doses of dilaudid resolving the severe neulasta aches. Right TMJ and right ear pain also resolved. Appetite is better, without nausea. She is tolerating more activity, including better ROM RUE. She still has some muscle spasm across right chest and at lower sternum with certain positions. She has slight clear rhinorrhea, no fever, no other respiratory symptoms, this suggesting her usual environmental allergies. Diarrhea resolved.  PAC in Flu vaccine done Multiple BRCA 1 and 2 variants   ONCOLOGIC HISTORY History is of left breast cancer in June 1996, when she was [redacted] weeks pregnant. This was 4x4x2.5 cm poorly differentiated invasive ductal and comedo carciinoma, ER PR negative, HER 2 not tested in 1996. She was treated with 2 cycles of adriamycin at 60 mg/m2 in [place] as she completed the pregnancy, then had left mastectomy with 12 of 23 left axillary nodes positive. She had additional adriamycin and cytoxan in [place] to total adriamycin dose of 300 mg/m2, followed by high dose chemotherapy with cytoxan, CDDP and autologous transplant on protocol at [place] (Dr J.Vredenburgh) , and local radiation on left. We have received treatment records from [facility] for high dose chemotherapy and autologous transplant done 1996, which I have reviewed and which will be scanned into this EMR. She had left TRAM reconstruction. She had multiple BRCA 1 and 2 variants at last genetics testing. She had hysterectomy with oophorectomy ~ 08-2009, but was reluctant to proceed  with prophylactic right mastectomy. She was followed with mammograms + yearly MRIs due to BRCA abnormalities. Daughter Heidi Sexton is healthy, age 48. Right breast cancer was identified on tomo mammography at [facility] 09-24-13, with heterogeneously dense breast tissue and suspicious area upper outer quadrant. She had MRI 09-27-13 with 9x5x6 mm mass upper outer quadrant on right, otherwise unremarkable bilaterally. US biopsy at [facility] 10-04-13 [phone]) documented invasive mammary carcinoma ER PR and HER 2 negative and proliferation fraction 87%. After consultation with general surgery, plastic surgery, medical oncology and radiation oncology, she agreed to right mastectomy. She had staging CT CAP 10-13-13, unremarkable other than 5 mm low density lesion in dome of right hepatic lobe likely cyst and scarring left lung apex likely from prior RT.She had right mastectomy with sentinel node evaluation and immediate reconstruction with right latissimus myocutaneous flap + saline implant by Drs Barry Dienes and Harlow Mares on 11-16-13. [place] pathology [id number] from 11-16-13 had grade 3 invasive ductal carcinoma 0.6 cm with 3 sentinel axillary nodes + 1 nonsentinel node negative, closest margin 1 cm, no LVSI, repeat ER and PR testing both 0% and not sufficient tumor for repeat HER 2 (which had been negative on diagnostic biopsy 09-30-13 [phone]).   She had first carboplatin + taxotere on 12-28-13, with neulasta day 2.    Review of systems as above, also: No bleeding. No SOB or cough. Bowels moving, eating prunes. No LE swelling. No problems with PAC. Taste is improved now. Remainder of 10 point Review of Systems negative.  Objective:  Vital signs in last 24 hours:  BP 137/92 mmHg  Temp(Src) 98.6 F (37 C) (Oral)  Resp 20  Ht 5' 7.5" (1.715 m)  Wt 204 lb 1.6 oz (92.579 kg)  BMI 31.48 kg/m2 weight up 6 lbs.  Alert, oriented and appropriate. Ambulatory without assistance difficulty.   Alopecia  HEENT:PERRL, sclerae not icteric. Oral mucosa moist without lesions, posterior pharynx clear. Nasal turbinates not boggy. Neither TM dull or erythematous, minimal dull erythema distal cancal on right, improved. Neck supple. No JVD.  Lymphatics:no cervical,suraclavicular, axillary adenopathy Resp: clear to auscultation bilaterally and normal percussion bilaterally Cardio: regular rate and rhythm. No gallop. GI: soft, nontender, not distended, no mass or organomegaly. Normally active bowel sounds. Surgical incision right back healing well, still some surgical glue, slight tissue fluid likely at mid incision, no erythema or increased tenderness.  Musculoskeletal/ Extremities: without pitting edema, cords, tenderness Neuro: no peripheral neuropathy. Otherwise nonfocal. PSYCH brighter mood and affect Skin without rash, ecchymosis, petechiae Breasts: Right reconstructed breast healing well, left breast reconstruction without findings of concern for recurrence.  Axillae benign. Portacath-without erythema or tenderness  Lab Results:  Results for orders placed or performed in visit on 01/10/14  CBC with Differential  Result Value Ref Range   WBC 6.0 3.9 - 10.3 10e3/uL   NEUT# 4.0 1.5 - 6.5 10e3/uL   HGB 10.5 (L) 11.6 - 15.9 g/dL   HCT 32.9 (L) 34.8 - 46.6 %   Platelets 213 145 - 400 10e3/uL   MCV 87.4 79.5 - 101.0 fL   MCH 27.8 25.1 - 34.0 pg   MCHC 31.8 31.5 - 36.0 g/dL   RBC 3.77 3.70 - 5.45 10e6/uL   RDW 14.5 11.2 - 14.5 %   lymph# 1.6 0.9 - 3.3 10e3/uL   MONO# 0.4 0.1 - 0.9 10e3/uL   Eosinophils Absolute 0.0 0.0 - 0.5 10e3/uL   Basophils Absolute 0.0 0.0 - 0.1 10e3/uL   NEUT% 67.4 38.4 - 76.8 %   LYMPH% 26.2 14.0 - 49.7 %   MONO% 5.9 0.0 - 14.0 %   EOS% 0.1 0.0 - 7.0 %   BASO% 0.4 0.0 - 2.0 %  Comprehensive metabolic panel (Cmet) - [facility]  Result Value Ref Range   Sodium 138 136 - 145 mEq/L   Potassium 4.0 3.5 - 5.1 mEq/L   Chloride 103 98 - 109 mEq/L   CO2 23 22 - 29  mEq/L   Glucose 172 (H) 70 - 140 mg/dl   BUN 10.4 7.0 - 26.0 mg/dL   Creatinine 0.8 0.6 - 1.1 mg/dL   Total Bilirubin 0.37 0.20 - 1.20 mg/dL   Alkaline Phosphatase 91 40 - 150 U/L   AST 13 5 - 34 U/L   ALT 21 0 - 55 U/L   Total Protein 6.9 6.4 - 8.3 g/dL   Albumin 3.6 3.5 - 5.0 g/dL   Calcium 9.3 8.4 - 10.4 mg/dL   Anion Gap 12 (H) 3 - 11 mEq/L     Studies/Results:  No results found.  Medications: I have reviewed the patient's current medications. Decadron and dilaudid refilled.  DISCUSSION: Information above discussed. I have told her that generally the weekly taxol is easier than the initial carbo/ taxotere.  Assessment/Plan:  1.Triple negative right breast cancer 0.6 cm, 4 nodes negative: post right mastectomy with axillary node evaluation and immediate reconstruction with latissimus flap 11-16-13.  Cycle 1 carbo taxotere 12-28-13 with neulasta on 86-76-19, complicated by severe neulasta aches initially, all symptoms better now. She will have cycle 2 on 01-18-14 as long as ANC >=1.5 and plt >=100k, with decadron 8 mg bid x 3 days  beginning day prior to chemo, and neulasta on day 2. I will see her back with lab ~ 1 week after treatment. 2.left breast cancer 1996: 4 cm primary, 12 nodes involved, ER PR negative (HER 2 not tested in 1996), diagnosed when 7 months pregnant, treated with adriamycin prior to delivery, then [place] to total adria dose 300 mg/m2, then high dose therapy with cytoxan, CDDP, carmustine and autologous BMT at [facility], then RT. No known recurrent disease. Left TRAM reconstruction. Note Dr Isidore Moos had planned to obtain RT records from 1996 to be sure these are available in present EMR. 3.multiple BRCA 1 and 2 variants 4. PAC done by Dr Barry Dienes  5.zoster shortly after completion of treatment 1996: prophylaxis with acyclovir due to steroids with upcoming chemo 6.flu vaccine done  All questions answered. Patient and husband understand discussion and are in agreement with  plan. Chemo and neulasta orders confirmed.  Renner Sebald P, MD   01/10/2014, 3:12 PM

## 2014-01-10 NOTE — Telephone Encounter (Signed)
, °

## 2014-01-11 ENCOUNTER — Telehealth: Payer: Self-pay | Admitting: *Deleted

## 2014-01-11 NOTE — Telephone Encounter (Signed)
Per staff message and POF I have scheduled appts. Advised scheduler of appts. JMW  

## 2014-01-16 ENCOUNTER — Other Ambulatory Visit: Payer: Self-pay | Admitting: Oncology

## 2014-01-16 DIAGNOSIS — C50411 Malignant neoplasm of upper-outer quadrant of right female breast: Secondary | ICD-10-CM

## 2014-01-18 ENCOUNTER — Ambulatory Visit: Payer: 59

## 2014-01-18 ENCOUNTER — Ambulatory Visit (HOSPITAL_BASED_OUTPATIENT_CLINIC_OR_DEPARTMENT_OTHER): Payer: 59

## 2014-01-18 ENCOUNTER — Other Ambulatory Visit (HOSPITAL_BASED_OUTPATIENT_CLINIC_OR_DEPARTMENT_OTHER): Payer: 59

## 2014-01-18 DIAGNOSIS — C50411 Malignant neoplasm of upper-outer quadrant of right female breast: Secondary | ICD-10-CM

## 2014-01-18 DIAGNOSIS — Z95828 Presence of other vascular implants and grafts: Secondary | ICD-10-CM

## 2014-01-18 DIAGNOSIS — Z5111 Encounter for antineoplastic chemotherapy: Secondary | ICD-10-CM

## 2014-01-18 LAB — CBC WITH DIFFERENTIAL/PLATELET
BASO%: 0 % (ref 0.0–2.0)
Basophils Absolute: 0 10*3/uL (ref 0.0–0.1)
EOS ABS: 0 10*3/uL (ref 0.0–0.5)
EOS%: 0 % (ref 0.0–7.0)
HCT: 32.2 % — ABNORMAL LOW (ref 34.8–46.6)
HGB: 10.5 g/dL — ABNORMAL LOW (ref 11.6–15.9)
LYMPH%: 8.1 % — ABNORMAL LOW (ref 14.0–49.7)
MCH: 28.4 pg (ref 25.1–34.0)
MCHC: 32.6 g/dL (ref 31.5–36.0)
MCV: 87 fL (ref 79.5–101.0)
MONO#: 0.5 10*3/uL (ref 0.1–0.9)
MONO%: 6 % (ref 0.0–14.0)
NEUT%: 85.9 % — ABNORMAL HIGH (ref 38.4–76.8)
NEUTROS ABS: 7.1 10*3/uL — AB (ref 1.5–6.5)
Platelets: 365 10*3/uL (ref 145–400)
RBC: 3.7 10*6/uL (ref 3.70–5.45)
RDW: 14.8 % — AB (ref 11.2–14.5)
WBC: 8.3 10*3/uL (ref 3.9–10.3)
lymph#: 0.7 10*3/uL — ABNORMAL LOW (ref 0.9–3.3)

## 2014-01-18 LAB — COMPREHENSIVE METABOLIC PANEL (CC13)
ALK PHOS: 86 U/L (ref 40–150)
ALT: 15 U/L (ref 0–55)
AST: 12 U/L (ref 5–34)
Albumin: 3.7 g/dL (ref 3.5–5.0)
Anion Gap: 10 mEq/L (ref 3–11)
BILIRUBIN TOTAL: 0.42 mg/dL (ref 0.20–1.20)
BUN: 15.5 mg/dL (ref 7.0–26.0)
CO2: 22 mEq/L (ref 22–29)
Calcium: 9.8 mg/dL (ref 8.4–10.4)
Chloride: 105 mEq/L (ref 98–109)
Creatinine: 0.8 mg/dL (ref 0.6–1.1)
GLUCOSE: 110 mg/dL (ref 70–140)
POTASSIUM: 4.4 meq/L (ref 3.5–5.1)
SODIUM: 137 meq/L (ref 136–145)
TOTAL PROTEIN: 7.4 g/dL (ref 6.4–8.3)

## 2014-01-18 MED ORDER — DEXAMETHASONE SODIUM PHOSPHATE 20 MG/5ML IJ SOLN
INTRAMUSCULAR | Status: AC
  Filled 2014-01-18: qty 5

## 2014-01-18 MED ORDER — HEPARIN SOD (PORK) LOCK FLUSH 100 UNIT/ML IV SOLN
500.0000 [IU] | Freq: Once | INTRAVENOUS | Status: AC | PRN
  Administered 2014-01-18: 500 [IU]
  Filled 2014-01-18: qty 5

## 2014-01-18 MED ORDER — ONDANSETRON 16 MG/50ML IVPB (CHCC)
INTRAVENOUS | Status: AC
  Filled 2014-01-18: qty 16

## 2014-01-18 MED ORDER — SODIUM CHLORIDE 0.9 % IV SOLN
738.5000 mg | Freq: Once | INTRAVENOUS | Status: AC
  Administered 2014-01-18: 740 mg via INTRAVENOUS
  Filled 2014-01-18: qty 74

## 2014-01-18 MED ORDER — DEXAMETHASONE SODIUM PHOSPHATE 20 MG/5ML IJ SOLN
20.0000 mg | Freq: Once | INTRAMUSCULAR | Status: AC
  Administered 2014-01-18: 20 mg via INTRAVENOUS

## 2014-01-18 MED ORDER — DOCETAXEL CHEMO INJECTION 160 MG/16ML
75.0000 mg/m2 | Freq: Once | INTRAVENOUS | Status: AC
  Administered 2014-01-18: 160 mg via INTRAVENOUS
  Filled 2014-01-18: qty 16

## 2014-01-18 MED ORDER — SODIUM CHLORIDE 0.9 % IJ SOLN
10.0000 mL | INTRAMUSCULAR | Status: DC | PRN
  Administered 2014-01-18: 10 mL
  Filled 2014-01-18: qty 10

## 2014-01-18 MED ORDER — SODIUM CHLORIDE 0.9 % IV SOLN
Freq: Once | INTRAVENOUS | Status: AC
  Administered 2014-01-18: 12:00:00 via INTRAVENOUS

## 2014-01-18 MED ORDER — ONDANSETRON 16 MG/50ML IVPB (CHCC)
16.0000 mg | Freq: Once | INTRAVENOUS | Status: AC
  Administered 2014-01-18: 16 mg via INTRAVENOUS

## 2014-01-18 MED ORDER — SODIUM CHLORIDE 0.9 % IJ SOLN
10.0000 mL | INTRAMUSCULAR | Status: DC | PRN
  Administered 2014-01-18: 10 mL via INTRAVENOUS
  Filled 2014-01-18: qty 10

## 2014-01-18 NOTE — Patient Instructions (Signed)

## 2014-01-18 NOTE — Patient Instructions (Signed)
[facility] Discharge Instructions for Patients Receiving Chemotherapy  Today you received the following chemotherapy agents Taxotere and Carboplatin.  To help prevent nausea and vomiting after your treatment, we encourage you to take your nausea medication.   If you develop nausea and vomiting that is not controlled by your nausea medication, call the clinic.   BELOW ARE SYMPTOMS THAT SHOULD BE REPORTED IMMEDIATELY:  *FEVER GREATER THAN 100.5 F  *CHILLS WITH OR WITHOUT FEVER  NAUSEA AND VOMITING THAT IS NOT CONTROLLED WITH YOUR NAUSEA MEDICATION  *UNUSUAL SHORTNESS OF BREATH  *UNUSUAL BRUISING OR BLEEDING  TENDERNESS IN MOUTH AND THROAT WITH OR WITHOUT PRESENCE OF ULCERS  *URINARY PROBLEMS  *BOWEL PROBLEMS  UNUSUAL RASH Items with * indicate a potential emergency and should be followed up as soon as possible.  Feel free to call the clinic you have any questions or concerns. The clinic phone number is [phone].    

## 2014-01-19 ENCOUNTER — Ambulatory Visit (HOSPITAL_BASED_OUTPATIENT_CLINIC_OR_DEPARTMENT_OTHER): Payer: 59

## 2014-01-19 DIAGNOSIS — C50411 Malignant neoplasm of upper-outer quadrant of right female breast: Secondary | ICD-10-CM

## 2014-01-19 DIAGNOSIS — Z5189 Encounter for other specified aftercare: Secondary | ICD-10-CM

## 2014-01-19 MED ORDER — PEGFILGRASTIM INJECTION 6 MG/0.6ML ~~LOC~~
6.0000 mg | PREFILLED_SYRINGE | Freq: Once | SUBCUTANEOUS | Status: AC
  Administered 2014-01-19: 6 mg via SUBCUTANEOUS
  Filled 2014-01-19: qty 0.6

## 2014-01-19 NOTE — Patient Instructions (Signed)
Pegfilgrastim injection What is this medicine? PEGFILGRASTIM (peg fil GRA stim) is a long-acting granulocyte colony-stimulating factor that stimulates the growth of neutrophils, a type of white blood cell important in the body's fight against infection. It is used to reduce the incidence of fever and infection in patients with certain types of cancer who are receiving chemotherapy that affects the bone marrow. This medicine may be used for other purposes; ask your health care provider or pharmacist if you have questions. COMMON BRAND NAME(S): Neulasta What should I tell my health care provider before I take this medicine? They need to know if you have any of these conditions: -latex allergy -ongoing radiation therapy -sickle cell disease -skin reactions to acrylic adhesives (On-Body Injector only) -an unusual or allergic reaction to pegfilgrastim, filgrastim, other medicines, foods, dyes, or preservatives -pregnant or trying to get pregnant -breast-feeding How should I use this medicine? This medicine is for injection under the skin. If you get this medicine at home, you will be taught how to prepare and give the pre-filled syringe or how to use the On-body Injector. Refer to the patient Instructions for Use for detailed instructions. Use exactly as directed. Take your medicine at regular intervals. Do not take your medicine more often than directed. It is important that you put your used needles and syringes in a special sharps container. Do not put them in a trash can. If you do not have a sharps container, call your pharmacist or healthcare provider to get one. Talk to your pediatrician regarding the use of this medicine in children. Special care may be needed. Overdosage: If you think you have taken too much of this medicine contact a poison control center or emergency room at once. NOTE: This medicine is only for you. Do not share this medicine with others. What if I miss a dose? It is  important not to miss your dose. Call your doctor or health care professional if you miss your dose. If you miss a dose due to an On-body Injector failure or leakage, a new dose should be administered as soon as possible using a single prefilled syringe for manual use. What may interact with this medicine? Interactions have not been studied. Give your health care provider a list of all the medicines, herbs, non-prescription drugs, or dietary supplements you use. Also tell them if you smoke, drink alcohol, or use illegal drugs. Some items may interact with your medicine. This list may not describe all possible interactions. Give your health care provider a list of all the medicines, herbs, non-prescription drugs, or dietary supplements you use. Also tell them if you smoke, drink alcohol, or use illegal drugs. Some items may interact with your medicine. What should I watch for while using this medicine? You may need blood work done while you are taking this medicine. If you are going to need a MRI, CT scan, or other procedure, tell your doctor that you are using this medicine (On-Body Injector only). What side effects may I notice from receiving this medicine? Side effects that you should report to your doctor or health care professional as soon as possible: -allergic reactions like skin rash, itching or hives, swelling of the face, lips, or tongue -dizziness -fever -pain, redness, or irritation at site where injected -pinpoint red spots on the skin -shortness of breath or breathing problems -stomach or side pain, or pain at the shoulder -swelling -tiredness -trouble passing urine Side effects that usually do not require medical attention (report to your doctor   or health care professional if they continue or are bothersome): -bone pain -muscle pain This list may not describe all possible side effects. Call your doctor for medical advice about side effects. You may report side effects to FDA at  1-800-FDA-1088. Where should I keep my medicine? Keep out of the reach of children. Store pre-filled syringes in a refrigerator between 2 and 8 degrees C (36 and 46 degrees F). Do not freeze. Keep in carton to protect from light. Throw away this medicine if it is left out of the refrigerator for more than 48 hours. Throw away any unused medicine after the expiration date. NOTE: This sheet is a summary. It may not cover all possible information. If you have questions about this medicine, talk to your doctor, pharmacist, or health care provider.  2015, [employer]. (2013-05-06 16:14:05)  

## 2014-01-24 ENCOUNTER — Ambulatory Visit (HOSPITAL_BASED_OUTPATIENT_CLINIC_OR_DEPARTMENT_OTHER): Payer: 59 | Admitting: Oncology

## 2014-01-24 ENCOUNTER — Encounter: Payer: Self-pay | Admitting: Oncology

## 2014-01-24 ENCOUNTER — Ambulatory Visit (HOSPITAL_BASED_OUTPATIENT_CLINIC_OR_DEPARTMENT_OTHER): Payer: 59

## 2014-01-24 ENCOUNTER — Other Ambulatory Visit (HOSPITAL_BASED_OUTPATIENT_CLINIC_OR_DEPARTMENT_OTHER): Payer: 59

## 2014-01-24 VITALS — BP 133/70 | HR 100 | Temp 99.0°F | Resp 20 | Ht 67.5 in | Wt 201.4 lb

## 2014-01-24 DIAGNOSIS — C50411 Malignant neoplasm of upper-outer quadrant of right female breast: Secondary | ICD-10-CM

## 2014-01-24 DIAGNOSIS — Z95828 Presence of other vascular implants and grafts: Secondary | ICD-10-CM

## 2014-01-24 DIAGNOSIS — Z452 Encounter for adjustment and management of vascular access device: Secondary | ICD-10-CM

## 2014-01-24 LAB — COMPREHENSIVE METABOLIC PANEL (CC13)
ALK PHOS: 87 U/L (ref 40–150)
ALT: 20 U/L (ref 0–55)
AST: 16 U/L (ref 5–34)
Albumin: 3.8 g/dL (ref 3.5–5.0)
Anion Gap: 12 mEq/L — ABNORMAL HIGH (ref 3–11)
BILIRUBIN TOTAL: 0.45 mg/dL (ref 0.20–1.20)
BUN: 14.9 mg/dL (ref 7.0–26.0)
CO2: 23 mEq/L (ref 22–29)
Calcium: 9.7 mg/dL (ref 8.4–10.4)
Chloride: 102 mEq/L (ref 98–109)
Creatinine: 0.8 mg/dL (ref 0.6–1.1)
EGFR: >90 mL/min/{1.73_m2} (ref >90)
GLUCOSE: 126 mg/dL (ref 70–140)
Potassium: 3.5 mEq/L (ref 3.5–5.1)
SODIUM: 138 meq/L (ref 136–145)
Total Protein: 7 g/dL (ref 6.4–8.3)

## 2014-01-24 LAB — CBC WITH DIFFERENTIAL/PLATELET
BASO%: 0.3 % (ref 0.0–2.0)
Basophils Absolute: 0 10*3/uL (ref 0.0–0.1)
EOS ABS: 0.1 10*3/uL (ref 0.0–0.5)
EOS%: 1.1 % (ref 0.0–7.0)
HCT: 34.9 % (ref 34.8–46.6)
HGB: 11.1 g/dL — ABNORMAL LOW (ref 11.6–15.9)
LYMPH%: 27.1 % (ref 14.0–49.7)
MCH: 27.9 pg (ref 25.1–34.0)
MCHC: 31.9 g/dL (ref 31.5–36.0)
MCV: 87.7 fL (ref 79.5–101.0)
MONO#: 1.7 10*3/uL — ABNORMAL HIGH (ref 0.1–0.9)
MONO%: 18 % — ABNORMAL HIGH (ref 0.0–14.0)
NEUT%: 53.5 % (ref 38.4–76.8)
NEUTROS ABS: 5.2 10*3/uL (ref 1.5–6.5)
Platelets: 260 10*3/uL (ref 145–400)
RBC: 3.98 10*6/uL (ref 3.70–5.45)
RDW: 14.2 % (ref 11.2–14.5)
WBC: 9.7 10*3/uL (ref 3.9–10.3)
lymph#: 2.6 10*3/uL (ref 0.9–3.3)

## 2014-01-24 MED ORDER — SODIUM CHLORIDE 0.9 % IJ SOLN
10.0000 mL | INTRAMUSCULAR | Status: DC | PRN
  Administered 2014-01-24: 10 mL via INTRAVENOUS
  Filled 2014-01-24: qty 10

## 2014-01-24 MED ORDER — HEPARIN SOD (PORK) LOCK FLUSH 100 UNIT/ML IV SOLN
500.0000 [IU] | Freq: Once | INTRAVENOUS | Status: AC
  Administered 2014-01-24: 500 [IU] via INTRAVENOUS
  Filled 2014-01-24: qty 5

## 2014-01-24 NOTE — Patient Instructions (Signed)

## 2014-01-24 NOTE — Progress Notes (Signed)
OFFICE PROGRESS NOTE   01/24/2014   Physicians:F.Barry Dienes, D.Harlow Mares, K.Winifred Olive (GYN), S.Squire  INTERVAL HISTORY:  Patient is seen, together with husband, as she continues adjuvant chemotherapy for T1N0 triple negative right breast cancer, having had cycle 2 carbo taxotere on 01-18-14 with neulasta 01-19-14. Nadir counts ok cycle 1.  She has been fatigued, but overall has tolerated cycle 2 some better than first treatment. Dilaudid was helpful for back and leg pain after neulasta, aches most intense on days 5 and 6, with no jaw pain this cycle. She has had mild nausea, controlled with antiemetics. Bowels moved well last pm. She has no neuropathy in fingers, minimal intermittent tingling in toes. Taste is affected with chemo. She is sleeping.  She has PAC Flu vaccine done Multiple BRCA 1 and 2 variants Treated for left breast cancer including high dose chemo with autologous BMT 1996   ONCOLOGIC HISTORY History is of left breast cancer in June 1996, when she was [redacted] weeks pregnant. This was 4x4x2.5 cm poorly differentiated invasive ductal and comedo carciinoma, ER PR negative, HER 2 not tested in 1996. She was treated with 2 cycles of adriamycin at 60 mg/m2 in [place] as she completed the pregnancy, then had left mastectomy with 12 of 23 left axillary nodes positive. She had additional adriamycin and cytoxan in [place] to total adriamycin dose of 300 mg/m2, followed by high dose chemotherapy with cytoxan, CDDP and autologous transplant on protocol at [facility] (Dr J.Vredenburgh) , and local radiation on left. We have received treatment records from [facility] for high dose chemotherapy and autologous transplant done 1996, which I have reviewed and which will be scanned into this EMR. She had left TRAM reconstruction. She had multiple BRCA 1 and 2 variants at last genetics testing. She had hysterectomy with oophorectomy ~ 08-2009, but was reluctant to proceed with prophylactic right  mastectomy. She was followed with mammograms + yearly MRIs due to BRCA abnormalities. Daughter [name] is healthy, age 43. Right breast cancer was identified on tomo mammography at [facility] 09-24-13, with heterogeneously dense breast tissue and suspicious area upper outer quadrant. She had MRI 09-27-13 with 9x5x6 mm mass upper outer quadrant on right, otherwise unremarkable bilaterally. US biopsy at [facility] 10-04-13 [phone]) documented invasive mammary carcinoma ER PR and HER 2 negative and proliferation fraction 87%. After consultation with general surgery, plastic surgery, medical oncology and radiation oncology, she agreed to right mastectomy. She had staging CT CAP 10-13-13, unremarkable other than 5 mm low density lesion in dome of right hepatic lobe likely cyst and scarring left lung apex likely from prior RT.She had right mastectomy with sentinel node evaluation and immediate reconstruction with right latissimus myocutaneous flap + saline implant by Drs Barry Dienes and Harlow Mares on 11-16-13. [facility] [phone] from 11-16-13 had grade 3 invasive ductal carcinoma 0.6 cm with 3 sentinel axillary nodes + 1 nonsentinel node negative, closest margin 1 cm, no LVSI, repeat ER and PR testing both 0% and not sufficient tumor for repeat HER 2 (which had been negative on diagnostic biopsy 09-30-13 [phone]). She had first carboplatin + taxotere on 12-28-13, with neulasta day 2.    Review of systems as above, also: No fever or symptoms of infection. Muscle spasms and discomfort from right latissimus reconstruction not as intense. No SOB or cough. No LE swelling. No bleeding. Remainder of 10 point Review of Systems negative.  Objective:  Vital signs in last 24 hours:  BP 133/70 mmHg  Pulse 100  Temp(Src) 99 F (  37.2 C) (Oral)  Resp 20  Ht 5' 7.5" (1.715 m)  Wt 201 lb 6.4 oz (91.354 kg)  BMI 31.06 kg/m2  SpO2 100% Weight down 3 lbs Alert, oriented and appropriate. Ambulatory without  assistance.  Alopecia  HEENT:PERRL, sclerae not icteric. Oral mucosa moist without lesions, posterior pharynx clear.  Neck supple. No JVD.  Lymphatics:no cervical,supraclavicular, axillary adenopathy Resp: clear to auscultation bilaterally and normal percussion bilaterally Cardio: regular rate and rhythm. No gallop. GI: soft, nontender, not distended, no mass or organomegaly. Normally active bowel sounds.  Musculoskeletal/ Extremities: without pitting edema, cords, tenderness. Good ROM right shoulder.  Neuro: no peripheral neuropathy. Otherwise nonfocal. PSYCH appropriate mood and affect Skin without rash, ecchymosis, petechiae. Surgical scar right back healing well. Breasts: bilateral reconstructions without evidence of local recurrence. Less swelling at right reconstruction. Good symmetry. Axillae benign. Portacath-without erythema or tenderness  Lab Results:  Results for orders placed or performed in visit on 01/24/14  CBC with Differential  Result Value Ref Range   WBC 9.7 3.9 - 10.3 10e3/uL   NEUT# 5.2 1.5 - 6.5 10e3/uL   HGB 11.1 (L) 11.6 - 15.9 g/dL   HCT 34.9 34.8 - 46.6 %   Platelets 260 145 - 400 10e3/uL   MCV 87.7 79.5 - 101.0 fL   MCH 27.9 25.1 - 34.0 pg   MCHC 31.9 31.5 - 36.0 g/dL   RBC 3.98 3.70 - 5.45 10e6/uL   RDW 14.2 11.2 - 14.5 %   lymph# 2.6 0.9 - 3.3 10e3/uL   MONO# 1.7 (H) 0.1 - 0.9 10e3/uL   Eosinophils Absolute 0.1 0.0 - 0.5 10e3/uL   Basophils Absolute 0.0 0.0 - 0.1 10e3/uL   NEUT% 53.5 38.4 - 76.8 %   LYMPH% 27.1 14.0 - 49.7 %   MONO% 18.0 (H) 0.0 - 14.0 %   EOS% 1.1 0.0 - 7.0 %   BASO% 0.3 0.0 - 2.0 %  Comprehensive metabolic panel (Cmet) - [facility]  Result Value Ref Range   Sodium 138 136 - 145 mEq/L   Potassium 3.5 3.5 - 5.1 mEq/L   Chloride 102 98 - 109 mEq/L   CO2 23 22 - 29 mEq/L   Glucose 126 70 - 140 mg/dl   BUN 14.9 7.0 - 26.0 mg/dL   Creatinine 0.8 0.6 - 1.1 mg/dL   Total Bilirubin 0.45 0.20 - 1.20 mg/dL   Alkaline Phosphatase 87 40 -  150 U/L   AST 16 5 - 34 U/L   ALT 20 0 - 55 U/L   Total Protein 7.0 6.4 - 8.3 g/dL   Albumin 3.8 3.5 - 5.0 g/dL   Calcium 9.7 8.4 - 10.4 mg/dL   Anion Gap 12 (H) 3 - 11 mEq/L   EGFR >90 >90 ml/min/1.73 m2     Studies/Results:  No results found.  Medications: I have reviewed the patient's current medications.No refills needed. Continuing prophylactic acyclovir  DISCUSSION: she and husband are in agreement with continuing chemo as planned. She is still concerned about tolerance of the weekly taxol, tho I have told her again that generally the initial 4 cycles of carbo taxotere are most difficult. She hopes to work a little from home this next week, does have more help now with office projects.   Assessment/Plan: 1.Triple negative right breast cancer 0.6 cm, 4 nodes negative: post right mastectomy with axillary node evaluation and immediate reconstruction with latissimus flap 11-16-13. Cycle 1 carbo taxotere 12-28-13 with neulasta on 46-96-29, complicated by severe neulasta aches; cycle 2  on 01-18-14 + neulasta. I will see her with labs 12-14 and she will have cycle 3 02-08-14 if counts ok. 2.left breast cancer 1996: 4 cm primary, 12 nodes involved, ER PR negative (HER 2 not tested in 1996), diagnosed when 7 months pregnant, treated with adriamycin prior to delivery, then [facility] to total adria dose 300 mg/m2, then high dose therapy with cytoxan, CDDP, carmustine and autologous BMT at [facility], then RT. No known recurrent disease. Left TRAM reconstruction. Note Dr Isidore Moos had planned to obtain RT records from 1996 to be sure these are available in present EMR.Counts are tolerating present chemo with the neulasta support. 3.multiple BRCA 1 and 2 variants 4. PAC done by Dr Barry Dienes  5.zoster shortly after completion of treatment 1996: prophylaxis with acyclovir due to steroids with upcoming chemo 6.flu vaccine done 7.hysterectomy with oophorectomy 2011  All questions answered. They know to call prior  to next scheduled visit if needed. Time spent 25 min including >50% counseling and coordination of care.  Nehal Shives P, MD   01/24/2014, 4:50 PM

## 2014-01-30 ENCOUNTER — Other Ambulatory Visit: Payer: Self-pay | Admitting: Oncology

## 2014-01-31 ENCOUNTER — Encounter: Payer: Self-pay | Admitting: Oncology

## 2014-01-31 ENCOUNTER — Ambulatory Visit (HOSPITAL_BASED_OUTPATIENT_CLINIC_OR_DEPARTMENT_OTHER): Payer: 59

## 2014-01-31 ENCOUNTER — Ambulatory Visit (HOSPITAL_BASED_OUTPATIENT_CLINIC_OR_DEPARTMENT_OTHER): Payer: 59 | Admitting: Oncology

## 2014-01-31 ENCOUNTER — Other Ambulatory Visit (HOSPITAL_BASED_OUTPATIENT_CLINIC_OR_DEPARTMENT_OTHER): Payer: 59

## 2014-01-31 VITALS — BP 121/77 | HR 102 | Temp 98.7°F | Resp 20 | Ht 67.5 in | Wt 200.7 lb

## 2014-01-31 DIAGNOSIS — Z1501 Genetic susceptibility to malignant neoplasm of breast: Secondary | ICD-10-CM

## 2014-01-31 DIAGNOSIS — Z1509 Genetic susceptibility to other malignant neoplasm: Secondary | ICD-10-CM

## 2014-01-31 DIAGNOSIS — Z8619 Personal history of other infectious and parasitic diseases: Secondary | ICD-10-CM

## 2014-01-31 DIAGNOSIS — C50411 Malignant neoplasm of upper-outer quadrant of right female breast: Secondary | ICD-10-CM

## 2014-01-31 DIAGNOSIS — Z853 Personal history of malignant neoplasm of breast: Secondary | ICD-10-CM

## 2014-01-31 DIAGNOSIS — Z95828 Presence of other vascular implants and grafts: Secondary | ICD-10-CM

## 2014-01-31 LAB — COMPREHENSIVE METABOLIC PANEL (CC13)
ALT: 24 U/L (ref 0–55)
AST: 14 U/L (ref 5–34)
Albumin: 3.5 g/dL (ref 3.5–5.0)
Alkaline Phosphatase: 91 U/L (ref 40–150)
Anion Gap: 10 mEq/L (ref 3–11)
BUN: 12.4 mg/dL (ref 7.0–26.0)
CALCIUM: 9.2 mg/dL (ref 8.4–10.4)
CHLORIDE: 105 meq/L (ref 98–109)
CO2: 25 meq/L (ref 22–29)
CREATININE: 0.8 mg/dL (ref 0.6–1.1)
EGFR: >90 mL/min/{1.73_m2} (ref >90)
Glucose: 106 mg/dl (ref 70–140)
Potassium: 4.1 mEq/L (ref 3.5–5.1)
Sodium: 140 mEq/L (ref 136–145)
Total Bilirubin: 0.57 mg/dL (ref 0.20–1.20)
Total Protein: 7.1 g/dL (ref 6.4–8.3)

## 2014-01-31 LAB — CBC WITH DIFFERENTIAL/PLATELET
BASO%: 0.4 % (ref 0.0–2.0)
BASOS ABS: 0 10*3/uL (ref 0.0–0.1)
EOS%: 0.1 % (ref 0.0–7.0)
Eosinophils Absolute: 0 10*3/uL (ref 0.0–0.5)
HEMATOCRIT: 31.3 % — AB (ref 34.8–46.6)
HEMOGLOBIN: 10 g/dL — AB (ref 11.6–15.9)
LYMPH#: 1.7 10*3/uL (ref 0.9–3.3)
LYMPH%: 22.6 % (ref 14.0–49.7)
MCH: 28 pg (ref 25.1–34.0)
MCHC: 32 g/dL (ref 31.5–36.0)
MCV: 87.5 fL (ref 79.5–101.0)
MONO#: 0.5 10*3/uL (ref 0.1–0.9)
MONO%: 6.8 % (ref 0.0–14.0)
NEUT#: 5.1 10*3/uL (ref 1.5–6.5)
NEUT%: 70.1 % (ref 38.4–76.8)
Platelets: 230 10*3/uL (ref 145–400)
RBC: 3.58 10*6/uL — ABNORMAL LOW (ref 3.70–5.45)
RDW: 15.1 % — ABNORMAL HIGH (ref 11.2–14.5)
WBC: 7.3 10*3/uL (ref 3.9–10.3)

## 2014-01-31 MED ORDER — HEPARIN SOD (PORK) LOCK FLUSH 100 UNIT/ML IV SOLN
500.0000 [IU] | Freq: Once | INTRAVENOUS | Status: AC
  Administered 2014-01-31: 500 [IU] via INTRAVENOUS
  Filled 2014-01-31: qty 5

## 2014-01-31 MED ORDER — SODIUM CHLORIDE 0.9 % IJ SOLN
10.0000 mL | INTRAMUSCULAR | Status: DC | PRN
  Administered 2014-01-31: 10 mL via INTRAVENOUS
  Filled 2014-01-31: qty 10

## 2014-01-31 NOTE — Progress Notes (Signed)
OFFICE PROGRESS NOTE   01/31/2014   Physicians:F.Barry Dienes, D.Harlow Mares, K.Winifred Olive (GYN), S.Squire  INTERVAL HISTORY:   Patient is seen, together with husband, in continuing attention to adjuvant chemotherapy in process for second primary breast cancer, this triple negative on right. Cycle 2 carboplatin taxotere was given 01-18-14, with neulasta on 01-19-14. Plan is 4 cycles of this chemotherapy, followed by weekly taxol x 12.  Khristian has felt better over last few days, tho still intermittent sharp pains low back, legs and sternum related to the neulasta. She has not used any dilaudid for the pain in several days, last prescription for #20 on 01-10-14. Appetite and taste are improving, bowels are ok. She drove for first time since surgery and was able to lie on side for first time last PM.   She has PAC Flu vaccine done Multiple BRCA 1 and 2 variants Left breast cancer treatment 1996 included high dose chemo with autologous BMT.   She has PAC Flu vaccine done Multiple BRCA 1 and 2 variants Treated for left breast cancer including high dose chemo with autologous BMT 1996  ONCOLOGIC HISTORY History is of left breast cancer in June 1996, when she was [redacted] weeks pregnant. This was 4x4x2.5 cm poorly differentiated invasive ductal and comedo carciinoma, ER PR negative, HER 2 not tested in 1996. She was treated with 2 cycles of adriamycin at 60 mg/m2 in [place] as she completed the pregnancy, then had left mastectomy with 12 of 23 left axillary nodes positive. She had additional adriamycin and cytoxan in [place] to total adriamycin dose of 300 mg/m2, followed by high dose chemotherapy with cytoxan, CDDP and autologous transplant on protocol at [facility] (Dr J.Vredenburgh) , and local radiation on left. We have received treatment records from [facility] for high dose chemotherapy and autologous transplant done 1996, which I have reviewed and which will be scanned into this EMR. She had left  TRAM reconstruction. She had multiple BRCA 1 and 2 variants at last genetics testing. She had hysterectomy with oophorectomy ~ 08-2009, but was reluctant to proceed with prophylactic right mastectomy. She was followed with mammograms + yearly MRIs due to BRCA abnormalities. Daughter [name] is healthy, age 29. Right breast cancer was identified on tomo mammography at [facility] 09-24-13, with heterogeneously dense breast tissue and suspicious area upper outer quadrant. She had MRI 09-27-13 with 9x5x6 mm mass upper outer quadrant on right, otherwise unremarkable bilaterally. US biopsy at [facility] 10-04-13 [phone]) documented invasive mammary carcinoma ER PR and HER 2 negative and proliferation fraction 87%. After consultation with general surgery, plastic surgery, medical oncology and radiation oncology, she agreed to right mastectomy. She had staging CT CAP 10-13-13, unremarkable other than 5 mm low density lesion in dome of right hepatic lobe likely cyst and scarring left lung apex likely from prior RT.She had right mastectomy with sentinel node evaluation and immediate reconstruction with right latissimus myocutaneous flap + saline implant by Drs Barry Dienes and Harlow Mares on 11-16-13. [facility] [phone] from 11-16-13 had grade 3 invasive ductal carcinoma 0.6 cm with 3 sentinel axillary nodes + 1 nonsentinel node negative, closest margin 1 cm, no LVSI, repeat ER and PR testing both 0% and not sufficient tumor for repeat HER 2 (which had been negative on diagnostic biopsy 09-30-13 [phone]). She had first carboplatin + taxotere on 12-28-13, with neulasta day 2.     Review of systems as above, also: No fever or symptoms of infection. No bleeding. Tightness at times across reconstruction area right back  and right anterior chest, gradually improving. No jaw pain this cycle. Remainder of 10 point Review of Systems negative.  Objective:  Vital signs in last 24 hours:  BP 121/77 mmHg  Pulse 102   Temp(Src) 98.7 F (37.1 C) (Oral)  Resp 20  Ht 5' 7.5" (1.715 m)  Wt 200 lb 11.2 oz (91.037 kg)  BMI 30.95 kg/m2 Weight down 1 lb Alert, oriented and appropriate. Ambulatory much more easily today, able to get off exam table without assistance Alopecia  HEENT:PERRL, sclerae not icteric. Oral mucosa moist without lesions, posterior pharynx clear.  Neck supple. No JVD.  Lymphatics:no cervical,supraclavicular, axillary adenopathy Resp: clear to auscultation bilaterally and normal percussion bilaterally Surgical incision from latissimus flap right back closed, appears well healed. Cardio: regular rate and rhythm. No gallop. GI: soft, nontender, not distended, no mass or organomegaly. Normally active bowel sounds.  Musculoskeletal/ Extremities: without pitting edema, cords, tenderness Neuro: no peripheral neuropathy. Otherwise nonfocal. PSYCH appropriate mood and affect Skin without rash, ecchymosis, petechiae Breasts: reconstructions bilaterally without evidence of local recurrence. RIght incisions all look good. Axillae benign. Portacath-without erythema or tenderness  Lab Results:  Results for orders placed or performed in visit on 01/31/14  CBC with Differential  Result Value Ref Range   WBC 7.3 3.9 - 10.3 10e3/uL   NEUT# 5.1 1.5 - 6.5 10e3/uL   HGB 10.0 (L) 11.6 - 15.9 g/dL   HCT 31.3 (L) 34.8 - 46.6 %   Platelets 230 145 - 400 10e3/uL   MCV 87.5 79.5 - 101.0 fL   MCH 28.0 25.1 - 34.0 pg   MCHC 32.0 31.5 - 36.0 g/dL   RBC 3.58 (L) 3.70 - 5.45 10e6/uL   RDW 15.1 (H) 11.2 - 14.5 %   lymph# 1.7 0.9 - 3.3 10e3/uL   MONO# 0.5 0.1 - 0.9 10e3/uL   Eosinophils Absolute 0.0 0.0 - 0.5 10e3/uL   Basophils Absolute 0.0 0.0 - 0.1 10e3/uL   NEUT% 70.1 38.4 - 76.8 %   LYMPH% 22.6 14.0 - 49.7 %   MONO% 6.8 0.0 - 14.0 %   EOS% 0.1 0.0 - 7.0 %   BASO% 0.4 0.0 - 2.0 %  Comprehensive metabolic panel (Cmet) - [facility]  Result Value Ref Range   Sodium 140 136 - 145 mEq/L   Potassium 4.1 3.5  - 5.1 mEq/L   Chloride 105 98 - 109 mEq/L   CO2 25 22 - 29 mEq/L   Glucose 106 70 - 140 mg/dl   BUN 12.4 7.0 - 26.0 mg/dL   Creatinine 0.8 0.6 - 1.1 mg/dL   Total Bilirubin 0.57 0.20 - 1.20 mg/dL   Alkaline Phosphatase 91 40 - 150 U/L   AST 14 5 - 34 U/L   ALT 24 0 - 55 U/L   Total Protein 7.1 6.4 - 8.3 g/dL   Albumin 3.5 3.5 - 5.0 g/dL   Calcium 9.2 8.4 - 10.4 mg/dL   Anion Gap 10 3 - 11 mEq/L   EGFR >90 >90 ml/min/1.73 m2     Studies/Results:  No results found.  Medications: I have reviewed the patient's current medications.   DISCUSSION: Discussed mechanism of action of neulasta and interesting finding that aches are in usual skeletal locations even with BMT. Maigan tells me that she is entirely willing to go thru the chemotherapy if this improves her chances for good outcome from most recent breast cancer. Husband very supportive. She wants to stay on schedule with treatments, including cycle 3 just before Christmas.  Assessment/Plan: 1.Triple negative right breast cancer 0.6 cm, 4 nodes negative: post right mastectomy with axillary node evaluation and immediate reconstruction with latissimus flap 11-16-13. Cycle 1 carbo taxotere 12-28-13 with neulasta on 81-01-75, complicated by severe neulasta aches; cycle 2 on 01-18-14 + neulasta. She will have cycle 3 on 12-22 if ANC >=1.5 and plt >=100k, and neulasta on 12-23. Prn dilaudid most helpful for aches, patient using this sparingly. 2.left breast cancer 1996: 4 cm primary, 12 nodes involved, ER PR negative (HER 2 not tested in 1996), diagnosed when 7 months pregnant, treated with adriamycin prior to delivery, then [facility] to total adria dose 300 mg/m2, then high dose therapy with cytoxan, CDDP, carmustine and autologous BMT at [facility], then RT. No known recurrent disease. Left TRAM reconstruction. Note Dr Isidore Moos to obtain RT records from 1996 to be sure these are available in present EMR.Counts are tolerating present chemo with the neulasta  support. 3.multiple BRCA 1 and 2 variants 4. PAC in  (Dr Barry Dienes)  5.zoster shortly after completion of treatment 1996: prophylaxis with acyclovir due to steroids with present chemo 6.flu vaccine done 7.hysterectomy with oophorectomy 2011   All questions answered and they are in agreement with plan. Chemo orders completed, will need to be confirmed by chemistries and counts that day. Neulasta orders in.  She knows to call prior to next appointment if needed.    Gordy Levan, MD   01/31/2014, 12:47 PM

## 2014-01-31 NOTE — Patient Instructions (Signed)

## 2014-02-01 ENCOUNTER — Telehealth: Payer: Self-pay | Admitting: Oncology

## 2014-02-01 DIAGNOSIS — Z8619 Personal history of other infectious and parasitic diseases: Secondary | ICD-10-CM | POA: Insufficient documentation

## 2014-02-01 NOTE — Telephone Encounter (Signed)
per pof to sch pt appt-cld * spoke to pt gave pt aapt time 7 date-stated she will get copy of sch 12/22

## 2014-02-08 ENCOUNTER — Ambulatory Visit (HOSPITAL_BASED_OUTPATIENT_CLINIC_OR_DEPARTMENT_OTHER): Payer: 59 | Admitting: Lab

## 2014-02-08 ENCOUNTER — Ambulatory Visit (HOSPITAL_BASED_OUTPATIENT_CLINIC_OR_DEPARTMENT_OTHER): Payer: 59

## 2014-02-08 ENCOUNTER — Ambulatory Visit: Payer: 59

## 2014-02-08 DIAGNOSIS — Z5111 Encounter for antineoplastic chemotherapy: Secondary | ICD-10-CM

## 2014-02-08 DIAGNOSIS — Z95828 Presence of other vascular implants and grafts: Secondary | ICD-10-CM

## 2014-02-08 DIAGNOSIS — C50411 Malignant neoplasm of upper-outer quadrant of right female breast: Secondary | ICD-10-CM

## 2014-02-08 LAB — COMPREHENSIVE METABOLIC PANEL (CC13)
ALK PHOS: 75 U/L (ref 40–150)
ALT: 16 U/L (ref 0–55)
AST: 11 U/L (ref 5–34)
Albumin: 3.7 g/dL (ref 3.5–5.0)
Anion Gap: 10 mEq/L (ref 3–11)
BILIRUBIN TOTAL: 0.33 mg/dL (ref 0.20–1.20)
BUN: 16.2 mg/dL (ref 7.0–26.0)
CHLORIDE: 104 meq/L (ref 98–109)
CO2: 24 mEq/L (ref 22–29)
Calcium: 9.6 mg/dL (ref 8.4–10.4)
Creatinine: 0.8 mg/dL (ref 0.6–1.1)
Glucose: 162 mg/dl — ABNORMAL HIGH (ref 70–140)
Potassium: 4.5 mEq/L (ref 3.5–5.1)
Sodium: 138 mEq/L (ref 136–145)
TOTAL PROTEIN: 7.2 g/dL (ref 6.4–8.3)

## 2014-02-08 LAB — CBC WITH DIFFERENTIAL/PLATELET
BASO%: 0 % (ref 0.0–2.0)
Basophils Absolute: 0 10*3/uL (ref 0.0–0.1)
EOS ABS: 0 10*3/uL (ref 0.0–0.5)
EOS%: 0 % (ref 0.0–7.0)
HEMATOCRIT: 30.2 % — AB (ref 34.8–46.6)
HEMOGLOBIN: 9.6 g/dL — AB (ref 11.6–15.9)
LYMPH%: 5.7 % — AB (ref 14.0–49.7)
MCH: 28.3 pg (ref 25.1–34.0)
MCHC: 31.9 g/dL (ref 31.5–36.0)
MCV: 88.6 fL (ref 79.5–101.0)
MONO#: 0.3 10*3/uL (ref 0.1–0.9)
MONO%: 3.1 % (ref 0.0–14.0)
NEUT#: 9.2 10*3/uL — ABNORMAL HIGH (ref 1.5–6.5)
NEUT%: 91.2 % — ABNORMAL HIGH (ref 38.4–76.8)
PLATELETS: 315 10*3/uL (ref 145–400)
RBC: 3.41 10*6/uL — ABNORMAL LOW (ref 3.70–5.45)
RDW: 16.1 % — ABNORMAL HIGH (ref 11.2–14.5)
WBC: 10.1 10*3/uL (ref 3.9–10.3)
lymph#: 0.6 10*3/uL — ABNORMAL LOW (ref 0.9–3.3)

## 2014-02-08 MED ORDER — SODIUM CHLORIDE 0.9 % IJ SOLN
10.0000 mL | INTRAMUSCULAR | Status: DC | PRN
  Administered 2014-02-08: 10 mL via INTRAVENOUS
  Filled 2014-02-08: qty 10

## 2014-02-08 MED ORDER — SODIUM CHLORIDE 0.9 % IV SOLN
738.5000 mg | Freq: Once | INTRAVENOUS | Status: AC
  Administered 2014-02-08: 740 mg via INTRAVENOUS
  Filled 2014-02-08: qty 74

## 2014-02-08 MED ORDER — SODIUM CHLORIDE 0.9 % IV SOLN
Freq: Once | INTRAVENOUS | Status: AC
  Administered 2014-02-08: 14:00:00 via INTRAVENOUS

## 2014-02-08 MED ORDER — ONDANSETRON 16 MG/50ML IVPB (CHCC)
INTRAVENOUS | Status: AC
  Filled 2014-02-08: qty 16

## 2014-02-08 MED ORDER — ONDANSETRON 16 MG/50ML IVPB (CHCC)
16.0000 mg | Freq: Once | INTRAVENOUS | Status: AC
  Administered 2014-02-08: 16 mg via INTRAVENOUS

## 2014-02-08 MED ORDER — DEXAMETHASONE SODIUM PHOSPHATE 20 MG/5ML IJ SOLN
20.0000 mg | Freq: Once | INTRAMUSCULAR | Status: AC
  Administered 2014-02-08: 20 mg via INTRAVENOUS

## 2014-02-08 MED ORDER — HEPARIN SOD (PORK) LOCK FLUSH 100 UNIT/ML IV SOLN
500.0000 [IU] | Freq: Once | INTRAVENOUS | Status: AC | PRN
  Administered 2014-02-08: 500 [IU]
  Filled 2014-02-08: qty 5

## 2014-02-08 MED ORDER — DEXAMETHASONE SODIUM PHOSPHATE 20 MG/5ML IJ SOLN
INTRAMUSCULAR | Status: AC
  Filled 2014-02-08: qty 5

## 2014-02-08 MED ORDER — SODIUM CHLORIDE 0.9 % IJ SOLN
10.0000 mL | INTRAMUSCULAR | Status: DC | PRN
  Administered 2014-02-08: 10 mL
  Filled 2014-02-08: qty 10

## 2014-02-08 MED ORDER — DOCETAXEL CHEMO INJECTION 160 MG/16ML
75.0000 mg/m2 | Freq: Once | INTRAVENOUS | Status: AC
  Administered 2014-02-08: 160 mg via INTRAVENOUS
  Filled 2014-02-08: qty 16

## 2014-02-08 NOTE — Patient Instructions (Signed)

## 2014-02-08 NOTE — Patient Instructions (Signed)
[facility] Discharge Instructions for Patients Receiving Chemotherapy  Today you received the following chemotherapy agents: Carboplatin, Taxotere  To help prevent nausea and vomiting after your treatment, we encourage you to take your nausea medication as prescribed by your physician.   If you develop nausea and vomiting that is not controlled by your nausea medication, call the clinic.   BELOW ARE SYMPTOMS THAT SHOULD BE REPORTED IMMEDIATELY:  *FEVER GREATER THAN 100.5 F  *CHILLS WITH OR WITHOUT FEVER  NAUSEA AND VOMITING THAT IS NOT CONTROLLED WITH YOUR NAUSEA MEDICATION  *UNUSUAL SHORTNESS OF BREATH  *UNUSUAL BRUISING OR BLEEDING  TENDERNESS IN MOUTH AND THROAT WITH OR WITHOUT PRESENCE OF ULCERS  *URINARY PROBLEMS  *BOWEL PROBLEMS  UNUSUAL RASH Items with * indicate a potential emergency and should be followed up as soon as possible.  Feel free to call the clinic you have any questions or concerns. The clinic phone number is [phone].

## 2014-02-09 ENCOUNTER — Ambulatory Visit (HOSPITAL_BASED_OUTPATIENT_CLINIC_OR_DEPARTMENT_OTHER): Payer: 59

## 2014-02-09 DIAGNOSIS — Z5189 Encounter for other specified aftercare: Secondary | ICD-10-CM

## 2014-02-09 DIAGNOSIS — C50411 Malignant neoplasm of upper-outer quadrant of right female breast: Secondary | ICD-10-CM

## 2014-02-09 MED ORDER — PEGFILGRASTIM INJECTION 6 MG/0.6ML ~~LOC~~
6.0000 mg | PREFILLED_SYRINGE | Freq: Once | SUBCUTANEOUS | Status: AC
  Administered 2014-02-09: 6 mg via SUBCUTANEOUS
  Filled 2014-02-09: qty 0.6

## 2014-02-09 NOTE — Patient Instructions (Signed)
Pegfilgrastim injection What is this medicine? PEGFILGRASTIM (peg fil GRA stim) is a long-acting granulocyte colony-stimulating factor that stimulates the growth of neutrophils, a type of white blood cell important in the body's fight against infection. It is used to reduce the incidence of fever and infection in patients with certain types of cancer who are receiving chemotherapy that affects the bone marrow. This medicine may be used for other purposes; ask your health care provider or pharmacist if you have questions. COMMON BRAND NAME(S): Neulasta What should I tell my health care provider before I take this medicine? They need to know if you have any of these conditions: -latex allergy -ongoing radiation therapy -sickle cell disease -skin reactions to acrylic adhesives (On-Body Injector only) -an unusual or allergic reaction to pegfilgrastim, filgrastim, other medicines, foods, dyes, or preservatives -pregnant or trying to get pregnant -breast-feeding How should I use this medicine? This medicine is for injection under the skin. If you get this medicine at home, you will be taught how to prepare and give the pre-filled syringe or how to use the On-body Injector. Refer to the patient Instructions for Use for detailed instructions. Use exactly as directed. Take your medicine at regular intervals. Do not take your medicine more often than directed. It is important that you put your used needles and syringes in a special sharps container. Do not put them in a trash can. If you do not have a sharps container, call your pharmacist or healthcare provider to get one. Talk to your pediatrician regarding the use of this medicine in children. Special care may be needed. Overdosage: If you think you have taken too much of this medicine contact a poison control center or emergency room at once. NOTE: This medicine is only for you. Do not share this medicine with others. What if I miss a dose? It is  important not to miss your dose. Call your doctor or health care professional if you miss your dose. If you miss a dose due to an On-body Injector failure or leakage, a new dose should be administered as soon as possible using a single prefilled syringe for manual use. What may interact with this medicine? Interactions have not been studied. Give your health care provider a list of all the medicines, herbs, non-prescription drugs, or dietary supplements you use. Also tell them if you smoke, drink alcohol, or use illegal drugs. Some items may interact with your medicine. This list may not describe all possible interactions. Give your health care provider a list of all the medicines, herbs, non-prescription drugs, or dietary supplements you use. Also tell them if you smoke, drink alcohol, or use illegal drugs. Some items may interact with your medicine. What should I watch for while using this medicine? You may need blood work done while you are taking this medicine. If you are going to need a MRI, CT scan, or other procedure, tell your doctor that you are using this medicine (On-Body Injector only). What side effects may I notice from receiving this medicine? Side effects that you should report to your doctor or health care professional as soon as possible: -allergic reactions like skin rash, itching or hives, swelling of the face, lips, or tongue -dizziness -fever -pain, redness, or irritation at site where injected -pinpoint red spots on the skin -shortness of breath or breathing problems -stomach or side pain, or pain at the shoulder -swelling -tiredness -trouble passing urine Side effects that usually do not require medical attention (report to your doctor   or health care professional if they continue or are bothersome): -bone pain -muscle pain This list may not describe all possible side effects. Call your doctor for medical advice about side effects. You may report side effects to FDA at  1-800-FDA-1088. Where should I keep my medicine? Keep out of the reach of children. Store pre-filled syringes in a refrigerator between 2 and 8 degrees C (36 and 46 degrees F). Do not freeze. Keep in carton to protect from light. Throw away this medicine if it is left out of the refrigerator for more than 48 hours. Throw away any unused medicine after the expiration date. NOTE: This sheet is a summary. It may not cover all possible information. If you have questions about this medicine, talk to your doctor, pharmacist, or health care provider.  2015, [employer]. (2013-05-06 16:14:05)  

## 2014-02-13 ENCOUNTER — Other Ambulatory Visit: Payer: Self-pay | Admitting: Oncology

## 2014-02-13 DIAGNOSIS — C50411 Malignant neoplasm of upper-outer quadrant of right female breast: Secondary | ICD-10-CM

## 2014-02-14 ENCOUNTER — Telehealth: Payer: Self-pay | Admitting: *Deleted

## 2014-02-14 NOTE — Telephone Encounter (Signed)
Per staff message and POF I have scheduled appts. Advised scheduler of appts. JMW  

## 2014-02-21 ENCOUNTER — Other Ambulatory Visit (HOSPITAL_BASED_OUTPATIENT_CLINIC_OR_DEPARTMENT_OTHER): Payer: 59

## 2014-02-21 ENCOUNTER — Telehealth: Payer: Self-pay | Admitting: Oncology

## 2014-02-21 ENCOUNTER — Encounter: Payer: Self-pay | Admitting: Oncology

## 2014-02-21 ENCOUNTER — Ambulatory Visit (HOSPITAL_BASED_OUTPATIENT_CLINIC_OR_DEPARTMENT_OTHER): Payer: 59 | Admitting: Oncology

## 2014-02-21 ENCOUNTER — Ambulatory Visit (HOSPITAL_BASED_OUTPATIENT_CLINIC_OR_DEPARTMENT_OTHER): Payer: 59

## 2014-02-21 VITALS — BP 127/77 | HR 90 | Temp 98.4°F | Resp 18 | Ht 67.5 in | Wt 197.6 lb

## 2014-02-21 DIAGNOSIS — C50912 Malignant neoplasm of unspecified site of left female breast: Secondary | ICD-10-CM

## 2014-02-21 DIAGNOSIS — C50411 Malignant neoplasm of upper-outer quadrant of right female breast: Secondary | ICD-10-CM

## 2014-02-21 DIAGNOSIS — Z853 Personal history of malignant neoplasm of breast: Secondary | ICD-10-CM

## 2014-02-21 DIAGNOSIS — K5900 Constipation, unspecified: Secondary | ICD-10-CM

## 2014-02-21 DIAGNOSIS — R1032 Left lower quadrant pain: Secondary | ICD-10-CM

## 2014-02-21 DIAGNOSIS — Z95828 Presence of other vascular implants and grafts: Secondary | ICD-10-CM

## 2014-02-21 DIAGNOSIS — Z1501 Genetic susceptibility to malignant neoplasm of breast: Secondary | ICD-10-CM

## 2014-02-21 DIAGNOSIS — C50911 Malignant neoplasm of unspecified site of right female breast: Secondary | ICD-10-CM

## 2014-02-21 DIAGNOSIS — Z452 Encounter for adjustment and management of vascular access device: Secondary | ICD-10-CM

## 2014-02-21 DIAGNOSIS — Z171 Estrogen receptor negative status [ER-]: Secondary | ICD-10-CM

## 2014-02-21 LAB — CBC WITH DIFFERENTIAL/PLATELET
BASO%: 0.4 % (ref 0.0–2.0)
BASOS ABS: 0 10*3/uL (ref 0.0–0.1)
EOS%: 0.2 % (ref 0.0–7.0)
Eosinophils Absolute: 0 10*3/uL (ref 0.0–0.5)
HCT: 29.8 % — ABNORMAL LOW (ref 34.8–46.6)
HGB: 9.6 g/dL — ABNORMAL LOW (ref 11.6–15.9)
LYMPH%: 31.9 % (ref 14.0–49.7)
MCH: 28.4 pg (ref 25.1–34.0)
MCHC: 32.2 g/dL (ref 31.5–36.0)
MCV: 88.2 fL (ref 79.5–101.0)
MONO#: 0.5 10*3/uL (ref 0.1–0.9)
MONO%: 9.8 % (ref 0.0–14.0)
NEUT#: 2.6 10*3/uL (ref 1.5–6.5)
NEUT%: 57.7 % (ref 38.4–76.8)
Platelets: 213 10*3/uL (ref 145–400)
RBC: 3.38 10*6/uL — ABNORMAL LOW (ref 3.70–5.45)
RDW: 16.9 % — ABNORMAL HIGH (ref 11.2–14.5)
WBC: 4.6 10*3/uL (ref 3.9–10.3)
lymph#: 1.5 10*3/uL (ref 0.9–3.3)

## 2014-02-21 LAB — COMPREHENSIVE METABOLIC PANEL (CC13)
ALT: 33 U/L (ref 0–55)
ANION GAP: 12 meq/L — AB (ref 3–11)
AST: 25 U/L (ref 5–34)
Albumin: 3.6 g/dL (ref 3.5–5.0)
Alkaline Phosphatase: 105 U/L (ref 40–150)
BILIRUBIN TOTAL: 0.66 mg/dL (ref 0.20–1.20)
BUN: 12.2 mg/dL (ref 7.0–26.0)
CO2: 26 meq/L (ref 22–29)
CREATININE: 0.8 mg/dL (ref 0.6–1.1)
Calcium: 9.4 mg/dL (ref 8.4–10.4)
Chloride: 102 mEq/L (ref 98–109)
GLUCOSE: 94 mg/dL (ref 70–140)
Potassium: 3.9 mEq/L (ref 3.5–5.1)
Sodium: 141 mEq/L (ref 136–145)
Total Protein: 7.2 g/dL (ref 6.4–8.3)

## 2014-02-21 MED ORDER — DEXAMETHASONE 4 MG PO TABS: ORAL_TABLET | ORAL | Status: DC

## 2014-02-21 MED ORDER — HEPARIN SOD (PORK) LOCK FLUSH 100 UNIT/ML IV SOLN
500.0000 [IU] | Freq: Once | INTRAVENOUS | Status: AC
  Administered 2014-02-21: 500 [IU] via INTRAVENOUS
  Filled 2014-02-21: qty 5

## 2014-02-21 MED ORDER — SODIUM CHLORIDE 0.9 % IJ SOLN
10.0000 mL | INTRAMUSCULAR | Status: DC | PRN
  Administered 2014-02-21: 10 mL via INTRAVENOUS
  Filled 2014-02-21: qty 10

## 2014-02-21 NOTE — Progress Notes (Signed)
OFFICE PROGRESS NOTE   02/21/2014   Physicians:F.Barry Dienes, D.Harlow Mares, K.Winifred Olive (GYN), S.Squire  INTERVAL HISTORY:  Patient is seen, alone for visit, in continuing attention to adjuvant chemotherapy in process for triple negative breast cancer, which is her second primary breast cancer in setting of multiple BRCA 1 and 2 variants. Patient had #3 carboplatin taxotere on 02-08-14 with neulasta 02-09-14. Plan is to begin weekly taxol after 4 cycles of carbo/ taxotere.   Patient had no BM x 4 days, with left sided abdominal pain, improved with OTC "women's laxative" and prunes, with large BM when bowels finally moved. She had no vomiting with this. We have discussed using laxative daily if needed to keep bowels moving daily. She is otherwise feeling well now, with neulasta aches resolved.    She has PAC Flu vaccine done Multiple BRCA 1 and 2 variants Treated for left breast cancer including high dose chemo with autologous BMT 1996  ONCOLOGIC HISTORY  History is of left breast cancer in June 1996, when she was [redacted] weeks pregnant. This was 4x4x2.5 cm poorly differentiated invasive ductal and comedo carciinoma, ER PR negative, HER 2 not tested in 1996. She was treated with 2 cycles of adriamycin at 60 mg/m2 in [place] as she completed the pregnancy, then had left mastectomy with 12 of 23 left axillary nodes positive. She had additional adriamycin and cytoxan in [place] to total adriamycin dose of 300 mg/m2, followed by high dose chemotherapy with cytoxan, CDDP and autologous transplant on protocol at [facility] (Dr J.Vredenburgh) , and local radiation on left. We have received treatment records from [facility] for high dose chemotherapy and autologous transplant done 1996, which I have reviewed and which will be scanned into this EMR. She had left TRAM reconstruction. She had multiple BRCA 1 and 2 variants at last genetics testing. She had hysterectomy with oophorectomy ~ 08-2009, but was  reluctant to proceed with prophylactic right mastectomy. She was followed with mammograms + yearly MRIs due to BRCA abnormalities. Daughter [name] is healthy, age 53. Right breast cancer was identified on tomo mammography at [facility] 09-24-13, with heterogeneously dense breast tissue and suspicious area upper outer quadrant. She had MRI 09-27-13 with 9x5x6 mm mass upper outer quadrant on right, otherwise unremarkable bilaterally. US biopsy at [facility] 10-04-13 [phone]) documented invasive mammary carcinoma ER PR and HER 2 negative and proliferation fraction 87%. After consultation with general surgery, plastic surgery, medical oncology and radiation oncology, she agreed to right mastectomy. She had staging CT CAP 10-13-13, unremarkable other than 5 mm low density lesion in dome of right hepatic lobe likely cyst and scarring left lung apex likely from prior RT.She had right mastectomy with sentinel node evaluation and immediate reconstruction with right latissimus myocutaneous flap + saline implant by Drs Barry Dienes and Harlow Mares on 11-16-13. [place] pathology [phone] from 11-16-13 had grade 3 invasive ductal carcinoma 0.6 cm with 3 sentinel axillary nodes + 1 nonsentinel node negative, closest margin 1 cm, no LVSI, repeat ER and PR testing both 0% and not sufficient tumor for repeat HER 2 (which had been negative on diagnostic biopsy 09-30-13 [phone]). She had first carboplatin + taxotere on 12-28-13, with neulasta day 2.    Review of systems as above, also: No fever, no SOB, no bleeding. Slight discoloration of nails (has not used ice packs during chemo). No problems with surgical sites. Remainder of 10 point Review of Systems negative.  Objective:  Vital signs in last 24 hours:  BP 127/77 mmHg  Pulse 90  Temp(Src) 98.4 F (36.9 C) (Oral)  Resp 18  Ht 5' 7.5" (1.715 m)  Wt 197 lb 9.6 oz (89.631 kg)  BMI 30.47 kg/m2 Weight down 3 lbs. Alert, oriented and appropriate. Ambulatory  without difficulty.  Alopecia  HEENT:PERRL, sclerae not icteric. Oral mucosa moist without lesions, posterior pharynx clear.  Neck supple. No JVD.  Lymphatics:no cervical,supraclavicular, axillary adenopathy Resp: clear to auscultation bilaterally and normal percussion bilaterally.  Cardio: regular rate and rhythm. No gallop. GI: soft, nontender including LLQ, not distended, no mass or organomegaly. Normally active bowel sounds. Musculoskeletal/ Extremities: without pitting edema, cords, tenderness Neuro: no peripheral neuropathy. Otherwise nonfocal. PSYCH appropriate mood and affect Skin without rash, ecchymosis, petechiae. Surgical scar from right flap not remarkable on back. Breasts: bilateral reconstructions without concern for local recurrence. Axillae benign. Portacath-without erythema or tenderness  Lab Results:  Results for orders placed or performed in visit on 02/21/14  CBC with Differential  Result Value Ref Range   WBC 4.6 3.9 - 10.3 10e3/uL   NEUT# 2.6 1.5 - 6.5 10e3/uL   HGB 9.6 (L) 11.6 - 15.9 g/dL   HCT 29.8 (L) 34.8 - 46.6 %   Platelets 213 145 - 400 10e3/uL   MCV 88.2 79.5 - 101.0 fL   MCH 28.4 25.1 - 34.0 pg   MCHC 32.2 31.5 - 36.0 g/dL   RBC 3.38 (L) 3.70 - 5.45 10e6/uL   RDW 16.9 (H) 11.2 - 14.5 %   lymph# 1.5 0.9 - 3.3 10e3/uL   MONO# 0.5 0.1 - 0.9 10e3/uL   Eosinophils Absolute 0.0 0.0 - 0.5 10e3/uL   Basophils Absolute 0.0 0.0 - 0.1 10e3/uL   NEUT% 57.7 38.4 - 76.8 %   LYMPH% 31.9 14.0 - 49.7 %   MONO% 9.8 0.0 - 14.0 %   EOS% 0.2 0.0 - 7.0 %   BASO% 0.4 0.0 - 2.0 %  Comprehensive metabolic panel (Cmet) - [facility]  Result Value Ref Range   Sodium 141 136 - 145 mEq/L   Potassium 3.9 3.5 - 5.1 mEq/L   Chloride 102 98 - 109 mEq/L   CO2 26 22 - 29 mEq/L   Glucose 94 70 - 140 mg/dl   BUN 12.2 7.0 - 26.0 mg/dL   Creatinine 0.8 0.6 - 1.1 mg/dL   Total Bilirubin 0.66 0.20 - 1.20 mg/dL   Alkaline Phosphatase 105 40 - 150 U/L   AST 25 5 - 34 U/L   ALT 33 0  - 55 U/L   Total Protein 7.2 6.4 - 8.3 g/dL   Albumin 3.6 3.5 - 5.0 g/dL   Calcium 9.4 8.4 - 10.4 mg/dL   Anion Gap 12 (H) 3 - 11 mEq/L   EGFR >90 >90 ml/min/1.73 m2     Studies/Results:  No results found.  Medications: I have reviewed the patient's current medications. May need daily laxative, miralax or senokot S or other  DISCUSSION: She has not had taxol teaching yet, which will be by RN prior to start of that course.  Assessment/Plan: 1.Triple negative right breast cancer 0.6 cm, 4 nodes negative: post right mastectomy with axillary node evaluation and immediate reconstruction with latissimus flap 11-16-13. Cycle 1 carbo taxotere 12-28-13 with neulasta on 86-76-19, complicated by severe neulasta aches; cycle 2 on 01-18-14 + neulasta. She will have last planned cycle 4 carbo taxotere on 03-01-14 as long as Benedict >=1.5 and plt >=100k; she will follow these initial 4 chemo treatments with weekly taxol x12 beginning ~ 03-22-14. I  will see her 03-21-14 with labs. 2.left breast cancer 1996: 4 cm primary, 12 nodes involved, ER PR negative (HER 2 not tested in 1996), diagnosed when 7 months pregnant, treated with adriamycin prior to delivery, then [facility] to total adria dose 300 mg/m2, then high dose therapy with cytoxan, CDDP, carmustine and autologous BMT at [facility], then RT. No known recurrent disease. Left TRAM reconstruction. Note Dr Isidore Moos was to obtain RT records from 1996 to be sure these are available in present EMR.Counts are tolerating present chemo with the neulasta support. 3.multiple BRCA 1 and 2 variants 4. PAC done by Dr Barry Dienes  5.zoster shortly after completion of treatment 1996: prophylaxis with acyclovir due to steroids with upcoming chemo 6.flu vaccine done 7.hysterectomy with oophorectomy 2011 8. Constipation with left lower abdominal pain following most recent chemo. Last abdominal CT 09-2013 had diverticulae in descending colon, not inflamed then. Symptoms resolved now. Prn laxatives to  keep bowels moving daily.   Patient is in agreement with plans above and will call prior to scheduled return visit if needed.  Chemo orders confirmed for #4 carbo taxotere, including ice to fingernails during treatment. Taxol teaching to be done by RN prior to start of that chemo. Will premed with decadron 20 mg with food 12 hrs and 6 hrs  prior to first treatment, then if no problems will use 20 mg 12 hrs prior to subsequent premeds.   Dekota Shenk P, MD   02/21/2014, 3:40 PM

## 2014-02-21 NOTE — Telephone Encounter (Signed)
, °

## 2014-02-21 NOTE — Patient Instructions (Signed)

## 2014-02-22 ENCOUNTER — Telehealth: Payer: Self-pay | Admitting: *Deleted

## 2014-02-22 NOTE — Telephone Encounter (Signed)
Per staff message and POF I have scheduled appts. Advised scheduler of appts. JMW  

## 2014-02-28 ENCOUNTER — Other Ambulatory Visit: Payer: Self-pay

## 2014-02-28 DIAGNOSIS — C50411 Malignant neoplasm of upper-outer quadrant of right female breast: Secondary | ICD-10-CM

## 2014-02-28 MED ORDER — DEXAMETHASONE 4 MG PO TABS: ORAL_TABLET | ORAL | Status: DC

## 2014-03-01 ENCOUNTER — Other Ambulatory Visit (HOSPITAL_BASED_OUTPATIENT_CLINIC_OR_DEPARTMENT_OTHER): Payer: 59

## 2014-03-01 ENCOUNTER — Ambulatory Visit: Payer: 59

## 2014-03-01 ENCOUNTER — Ambulatory Visit (HOSPITAL_BASED_OUTPATIENT_CLINIC_OR_DEPARTMENT_OTHER): Payer: 59

## 2014-03-01 ENCOUNTER — Telehealth: Payer: Self-pay | Admitting: *Deleted

## 2014-03-01 ENCOUNTER — Telehealth: Payer: Self-pay | Admitting: Oncology

## 2014-03-01 DIAGNOSIS — Z95828 Presence of other vascular implants and grafts: Secondary | ICD-10-CM

## 2014-03-01 DIAGNOSIS — C50411 Malignant neoplasm of upper-outer quadrant of right female breast: Secondary | ICD-10-CM

## 2014-03-01 DIAGNOSIS — Z5111 Encounter for antineoplastic chemotherapy: Secondary | ICD-10-CM

## 2014-03-01 LAB — CBC WITH DIFFERENTIAL/PLATELET
BASO%: 0 % (ref 0.0–2.0)
BASOS ABS: 0 10*3/uL (ref 0.0–0.1)
EOS%: 0 % (ref 0.0–7.0)
Eosinophils Absolute: 0 10*3/uL (ref 0.0–0.5)
HCT: 28.5 % — ABNORMAL LOW (ref 34.8–46.6)
HEMOGLOBIN: 9.2 g/dL — AB (ref 11.6–15.9)
LYMPH#: 0.7 10*3/uL — AB (ref 0.9–3.3)
LYMPH%: 9.8 % — AB (ref 14.0–49.7)
MCH: 29 pg (ref 25.1–34.0)
MCHC: 32.3 g/dL (ref 31.5–36.0)
MCV: 89.9 fL (ref 79.5–101.0)
MONO#: 0.2 10*3/uL (ref 0.1–0.9)
MONO%: 3.6 % (ref 0.0–14.0)
NEUT#: 5.9 10*3/uL (ref 1.5–6.5)
NEUT%: 86.6 % — AB (ref 38.4–76.8)
PLATELETS: 275 10*3/uL (ref 145–400)
RBC: 3.17 10*6/uL — ABNORMAL LOW (ref 3.70–5.45)
RDW: 18.1 % — ABNORMAL HIGH (ref 11.2–14.5)
WBC: 6.8 10*3/uL (ref 3.9–10.3)

## 2014-03-01 LAB — COMPREHENSIVE METABOLIC PANEL (CC13)
ALBUMIN: 3.6 g/dL (ref 3.5–5.0)
ALT: 24 U/L (ref 0–55)
AST: 18 U/L (ref 5–34)
Alkaline Phosphatase: 75 U/L (ref 40–150)
Anion Gap: 7 mEq/L (ref 3–11)
BUN: 14.9 mg/dL (ref 7.0–26.0)
CALCIUM: 9.3 mg/dL (ref 8.4–10.4)
CHLORIDE: 108 meq/L (ref 98–109)
CO2: 24 mEq/L (ref 22–29)
Creatinine: 0.7 mg/dL (ref 0.6–1.1)
EGFR: >90 mL/min/{1.73_m2} (ref >90)
Glucose: 108 mg/dl (ref 70–140)
Potassium: 4.2 mEq/L (ref 3.5–5.1)
Sodium: 139 mEq/L (ref 136–145)
Total Bilirubin: 0.37 mg/dL (ref 0.20–1.20)
Total Protein: 6.9 g/dL (ref 6.4–8.3)

## 2014-03-01 MED ORDER — ONDANSETRON 16 MG/50ML IVPB (CHCC)
INTRAVENOUS | Status: AC
  Filled 2014-03-01: qty 16

## 2014-03-01 MED ORDER — HEPARIN SOD (PORK) LOCK FLUSH 100 UNIT/ML IV SOLN
500.0000 [IU] | Freq: Once | INTRAVENOUS | Status: AC | PRN
  Administered 2014-03-01: 500 [IU]
  Filled 2014-03-01: qty 5

## 2014-03-01 MED ORDER — SODIUM CHLORIDE 0.9 % IV SOLN
738.5000 mg | Freq: Once | INTRAVENOUS | Status: AC
  Administered 2014-03-01: 740 mg via INTRAVENOUS
  Filled 2014-03-01: qty 74

## 2014-03-01 MED ORDER — SODIUM CHLORIDE 0.9 % IV SOLN
Freq: Once | INTRAVENOUS | Status: AC
  Administered 2014-03-01: 12:00:00 via INTRAVENOUS

## 2014-03-01 MED ORDER — SODIUM CHLORIDE 0.9 % IJ SOLN
10.0000 mL | INTRAMUSCULAR | Status: DC | PRN
  Administered 2014-03-01: 10 mL
  Filled 2014-03-01: qty 10

## 2014-03-01 MED ORDER — SODIUM CHLORIDE 0.9 % IJ SOLN
10.0000 mL | INTRAMUSCULAR | Status: DC | PRN
  Administered 2014-03-01: 10 mL via INTRAVENOUS
  Filled 2014-03-01: qty 10

## 2014-03-01 MED ORDER — DEXAMETHASONE SODIUM PHOSPHATE 20 MG/5ML IJ SOLN
INTRAMUSCULAR | Status: AC
  Filled 2014-03-01: qty 5

## 2014-03-01 MED ORDER — ONDANSETRON 16 MG/50ML IVPB (CHCC)
16.0000 mg | Freq: Once | INTRAVENOUS | Status: AC
  Administered 2014-03-01: 16 mg via INTRAVENOUS

## 2014-03-01 MED ORDER — DEXAMETHASONE SODIUM PHOSPHATE 20 MG/5ML IJ SOLN
20.0000 mg | Freq: Once | INTRAMUSCULAR | Status: AC
  Administered 2014-03-01: 20 mg via INTRAVENOUS

## 2014-03-01 MED ORDER — DOCETAXEL CHEMO INJECTION 160 MG/16ML
75.0000 mg/m2 | Freq: Once | INTRAVENOUS | Status: AC
  Administered 2014-03-01: 160 mg via INTRAVENOUS
  Filled 2014-03-01: qty 16

## 2014-03-01 NOTE — Patient Instructions (Signed)

## 2014-03-01 NOTE — Progress Notes (Signed)
Ice applied to fingernails during taxotere per Dr Mariana Kaufman orders.

## 2014-03-01 NOTE — Telephone Encounter (Signed)
per pof to sch pt appt-Kristen stated she will call pt to make aware

## 2014-03-01 NOTE — Telephone Encounter (Signed)
Called patient with lab appt for 03/10/14 at 10am - per pt request flush appointment made so labs can be drawn from her PAC. Patient agreeable to this appt.

## 2014-03-01 NOTE — Patient Instructions (Addendum)
[facility] Discharge Instructions for Patients Receiving Chemotherapy  Today you received the following chemotherapy agents:  taxotere/Carboplatin  To help prevent nausea and vomiting after your treatment, we encourage you to take your nausea medication. If you develop nausea and vomiting that is not controlled by your nausea medication, call the clinic.   BELOW ARE SYMPTOMS THAT SHOULD BE REPORTED IMMEDIATELY:  *FEVER GREATER THAN 100.5 F  *CHILLS WITH OR WITHOUT FEVER  NAUSEA AND VOMITING THAT IS NOT CONTROLLED WITH YOUR NAUSEA MEDICATION  *UNUSUAL SHORTNESS OF BREATH  *UNUSUAL BRUISING OR BLEEDING  TENDERNESS IN MOUTH AND THROAT WITH OR WITHOUT PRESENCE OF ULCERS  *URINARY PROBLEMS  *BOWEL PROBLEMS  UNUSUAL RASH Items with * indicate a potential emergency and should be followed up as soon as possible.  Feel free to call the clinic you have any questions or concerns. The clinic phone number is [phone].   Come for neulasta 3:45 PM tomorrow.

## 2014-03-02 ENCOUNTER — Ambulatory Visit (HOSPITAL_BASED_OUTPATIENT_CLINIC_OR_DEPARTMENT_OTHER): Payer: 59

## 2014-03-02 DIAGNOSIS — C50411 Malignant neoplasm of upper-outer quadrant of right female breast: Secondary | ICD-10-CM

## 2014-03-02 DIAGNOSIS — Z5189 Encounter for other specified aftercare: Secondary | ICD-10-CM

## 2014-03-02 MED ORDER — PEGFILGRASTIM INJECTION 6 MG/0.6ML ~~LOC~~
6.0000 mg | PREFILLED_SYRINGE | Freq: Once | SUBCUTANEOUS | Status: AC
  Administered 2014-03-02: 6 mg via SUBCUTANEOUS
  Filled 2014-03-02: qty 0.6

## 2014-03-02 NOTE — Patient Instructions (Signed)
Pegfilgrastim injection What is this medicine? PEGFILGRASTIM (peg fil GRA stim) is a long-acting granulocyte colony-stimulating factor that stimulates the growth of neutrophils, a type of white blood cell important in the body's fight against infection. It is used to reduce the incidence of fever and infection in patients with certain types of cancer who are receiving chemotherapy that affects the bone marrow. This medicine may be used for other purposes; ask your health care provider or pharmacist if you have questions. COMMON BRAND NAME(S): Neulasta What should I tell my health care provider before I take this medicine? They need to know if you have any of these conditions: -latex allergy -ongoing radiation therapy -sickle cell disease -skin reactions to acrylic adhesives (On-Body Injector only) -an unusual or allergic reaction to pegfilgrastim, filgrastim, other medicines, foods, dyes, or preservatives -pregnant or trying to get pregnant -breast-feeding How should I use this medicine? This medicine is for injection under the skin. If you get this medicine at home, you will be taught how to prepare and give the pre-filled syringe or how to use the On-body Injector. Refer to the patient Instructions for Use for detailed instructions. Use exactly as directed. Take your medicine at regular intervals. Do not take your medicine more often than directed. It is important that you put your used needles and syringes in a special sharps container. Do not put them in a trash can. If you do not have a sharps container, call your pharmacist or healthcare provider to get one. Talk to your pediatrician regarding the use of this medicine in children. Special care may be needed. Overdosage: If you think you have taken too much of this medicine contact a poison control center or emergency room at once. NOTE: This medicine is only for you. Do not share this medicine with others. What if I miss a dose? It is  important not to miss your dose. Call your doctor or health care professional if you miss your dose. If you miss a dose due to an On-body Injector failure or leakage, a new dose should be administered as soon as possible using a single prefilled syringe for manual use. What may interact with this medicine? Interactions have not been studied. Give your health care provider a list of all the medicines, herbs, non-prescription drugs, or dietary supplements you use. Also tell them if you smoke, drink alcohol, or use illegal drugs. Some items may interact with your medicine. This list may not describe all possible interactions. Give your health care provider a list of all the medicines, herbs, non-prescription drugs, or dietary supplements you use. Also tell them if you smoke, drink alcohol, or use illegal drugs. Some items may interact with your medicine. What should I watch for while using this medicine? You may need blood work done while you are taking this medicine. If you are going to need a MRI, CT scan, or other procedure, tell your doctor that you are using this medicine (On-Body Injector only). What side effects may I notice from receiving this medicine? Side effects that you should report to your doctor or health care professional as soon as possible: -allergic reactions like skin rash, itching or hives, swelling of the face, lips, or tongue -dizziness -fever -pain, redness, or irritation at site where injected -pinpoint red spots on the skin -shortness of breath or breathing problems -stomach or side pain, or pain at the shoulder -swelling -tiredness -trouble passing urine Side effects that usually do not require medical attention (report to your doctor   or health care professional if they continue or are bothersome): -bone pain -muscle pain This list may not describe all possible side effects. Call your doctor for medical advice about side effects. You may report side effects to FDA at  1-800-FDA-1088. Where should I keep my medicine? Keep out of the reach of children. Store pre-filled syringes in a refrigerator between 2 and 8 degrees C (36 and 46 degrees F). Do not freeze. Keep in carton to protect from light. Throw away this medicine if it is left out of the refrigerator for more than 48 hours. Throw away any unused medicine after the expiration date. NOTE: This sheet is a summary. It may not cover all possible information. If you have questions about this medicine, talk to your doctor, pharmacist, or health care provider.  2015, [employer]. (2013-05-06 16:14:05)  

## 2014-03-07 ENCOUNTER — Other Ambulatory Visit: Payer: Self-pay | Admitting: Oncology

## 2014-03-08 ENCOUNTER — Other Ambulatory Visit: Payer: Self-pay | Admitting: Oncology

## 2014-03-08 DIAGNOSIS — C50411 Malignant neoplasm of upper-outer quadrant of right female breast: Secondary | ICD-10-CM

## 2014-03-10 ENCOUNTER — Telehealth: Payer: Self-pay

## 2014-03-10 ENCOUNTER — Other Ambulatory Visit: Payer: 59

## 2014-03-10 ENCOUNTER — Other Ambulatory Visit (HOSPITAL_BASED_OUTPATIENT_CLINIC_OR_DEPARTMENT_OTHER): Payer: 59

## 2014-03-10 ENCOUNTER — Ambulatory Visit (HOSPITAL_BASED_OUTPATIENT_CLINIC_OR_DEPARTMENT_OTHER): Payer: 59

## 2014-03-10 VITALS — BP 130/90 | HR 88 | Temp 98.7°F | Resp 20

## 2014-03-10 DIAGNOSIS — C50411 Malignant neoplasm of upper-outer quadrant of right female breast: Secondary | ICD-10-CM

## 2014-03-10 DIAGNOSIS — D509 Iron deficiency anemia, unspecified: Secondary | ICD-10-CM

## 2014-03-10 DIAGNOSIS — Z95828 Presence of other vascular implants and grafts: Secondary | ICD-10-CM

## 2014-03-10 LAB — CBC WITH DIFFERENTIAL/PLATELET
BASO%: 0.6 % (ref 0.0–2.0)
BASOS ABS: 0.1 10*3/uL (ref 0.0–0.1)
EOS%: 0.1 % (ref 0.0–7.0)
Eosinophils Absolute: 0 10*3/uL (ref 0.0–0.5)
HEMATOCRIT: 28.4 % — AB (ref 34.8–46.6)
HEMOGLOBIN: 8.8 g/dL — AB (ref 11.6–15.9)
LYMPH#: 2.3 10*3/uL (ref 0.9–3.3)
LYMPH%: 20.2 % (ref 14.0–49.7)
MCH: 28.6 pg (ref 25.1–34.0)
MCHC: 31.1 g/dL — AB (ref 31.5–36.0)
MCV: 92 fL (ref 79.5–101.0)
MONO#: 1.1 10*3/uL — AB (ref 0.1–0.9)
MONO%: 9.8 % (ref 0.0–14.0)
NEUT#: 7.9 10*3/uL — ABNORMAL HIGH (ref 1.5–6.5)
NEUT%: 69.3 % (ref 38.4–76.8)
Platelets: 234 10*3/uL (ref 145–400)
RBC: 3.09 10*6/uL — ABNORMAL LOW (ref 3.70–5.45)
RDW: 19.1 % — ABNORMAL HIGH (ref 11.2–14.5)
WBC: 11.3 10*3/uL — AB (ref 3.9–10.3)

## 2014-03-10 LAB — COMPREHENSIVE METABOLIC PANEL (CC13)
ALK PHOS: 93 U/L (ref 40–150)
ALT: 15 U/L (ref 0–55)
AST: 14 U/L (ref 5–34)
Albumin: 3.6 g/dL (ref 3.5–5.0)
Anion Gap: 9 mEq/L (ref 3–11)
BUN: 9.5 mg/dL (ref 7.0–26.0)
CO2: 27 meq/L (ref 22–29)
Calcium: 8.8 mg/dL (ref 8.4–10.4)
Chloride: 105 mEq/L (ref 98–109)
Creatinine: 0.8 mg/dL (ref 0.6–1.1)
EGFR: >90 mL/min/{1.73_m2} (ref >90)
GLUCOSE: 116 mg/dL (ref 70–140)
Potassium: 3.6 mEq/L (ref 3.5–5.1)
Sodium: 141 mEq/L (ref 136–145)
TOTAL PROTEIN: 6.5 g/dL (ref 6.4–8.3)
Total Bilirubin: 0.27 mg/dL (ref 0.20–1.20)

## 2014-03-10 MED ORDER — SODIUM CHLORIDE 0.9 % IJ SOLN
10.0000 mL | INTRAMUSCULAR | Status: DC | PRN
  Filled 2014-03-10: qty 10

## 2014-03-10 MED ORDER — HEPARIN SOD (PORK) LOCK FLUSH 100 UNIT/ML IV SOLN
500.0000 [IU] | Freq: Once | INTRAVENOUS | Status: DC
Start: 2014-03-10 — End: 2014-03-10
  Filled 2014-03-10: qty 5

## 2014-03-10 MED ORDER — SODIUM CHLORIDE 0.9 % IJ SOLN
10.0000 mL | INTRAMUSCULAR | Status: DC | PRN
  Administered 2014-03-10: 10 mL via INTRAVENOUS
  Filled 2014-03-10: qty 10

## 2014-03-10 MED ORDER — HEPARIN SOD (PORK) LOCK FLUSH 100 UNIT/ML IV SOLN
500.0000 [IU] | Freq: Once | INTRAVENOUS | Status: AC
  Administered 2014-03-10: 500 [IU] via INTRAVENOUS
  Filled 2014-03-10: qty 5

## 2014-03-10 MED ORDER — HEMOCYTE 324 (106 FE) MG PO TABS: ORAL_TABLET | ORAL | Status: DC

## 2014-03-10 NOTE — Telephone Encounter (Signed)
Told Heidi Sexton that that her wbc and anc were in a good rang.  Her Hgb is a little lower at 8.8.  She does not feel sob with exertion.  She felt fatigued for several days after her treatment on 03-01-14.  Told her that Dr. Marko Plume wants her to begin Hemocyte or ferrous fumarate 325 mg daily on an empty stomach with OJ.  Will send prescription to her pharmacy.  Heidi Sexton verbalized understanding.

## 2014-03-15 ENCOUNTER — Other Ambulatory Visit: Payer: Self-pay | Admitting: Oncology

## 2014-03-15 NOTE — Progress Notes (Signed)
Medical Oncology  Order for neupogen/ granix placed at request of managed care, for preauthorization requirements. If approved, will be used depending on counts during weekly taxol.  L.Livesay

## 2014-03-18 ENCOUNTER — Telehealth: Payer: Self-pay | Admitting: Oncology

## 2014-03-18 NOTE — Telephone Encounter (Signed)
cld & spoke to pt and gave new time & date for appt-pt understood

## 2014-03-20 ENCOUNTER — Other Ambulatory Visit: Payer: Self-pay | Admitting: Oncology

## 2014-03-21 ENCOUNTER — Encounter: Payer: Self-pay | Admitting: Oncology

## 2014-03-21 ENCOUNTER — Telehealth: Payer: Self-pay | Admitting: Oncology

## 2014-03-21 ENCOUNTER — Ambulatory Visit: Payer: 59

## 2014-03-21 ENCOUNTER — Other Ambulatory Visit (HOSPITAL_BASED_OUTPATIENT_CLINIC_OR_DEPARTMENT_OTHER): Payer: 59

## 2014-03-21 ENCOUNTER — Ambulatory Visit (HOSPITAL_BASED_OUTPATIENT_CLINIC_OR_DEPARTMENT_OTHER): Payer: 59 | Admitting: Oncology

## 2014-03-21 ENCOUNTER — Telehealth: Payer: Self-pay

## 2014-03-21 ENCOUNTER — Telehealth: Payer: Self-pay | Admitting: *Deleted

## 2014-03-21 VITALS — BP 126/50 | HR 94 | Temp 98.7°F | Resp 19 | Ht 67.5 in | Wt 199.7 lb

## 2014-03-21 DIAGNOSIS — Z8619 Personal history of other infectious and parasitic diseases: Secondary | ICD-10-CM

## 2014-03-21 DIAGNOSIS — Z853 Personal history of malignant neoplasm of breast: Secondary | ICD-10-CM

## 2014-03-21 DIAGNOSIS — Z9889 Other specified postprocedural states: Secondary | ICD-10-CM

## 2014-03-21 DIAGNOSIS — Z95828 Presence of other vascular implants and grafts: Secondary | ICD-10-CM

## 2014-03-21 DIAGNOSIS — C50912 Malignant neoplasm of unspecified site of left female breast: Secondary | ICD-10-CM

## 2014-03-21 DIAGNOSIS — R079 Chest pain, unspecified: Secondary | ICD-10-CM

## 2014-03-21 DIAGNOSIS — K5900 Constipation, unspecified: Secondary | ICD-10-CM

## 2014-03-21 DIAGNOSIS — C50411 Malignant neoplasm of upper-outer quadrant of right female breast: Secondary | ICD-10-CM

## 2014-03-21 DIAGNOSIS — Z1501 Genetic susceptibility to malignant neoplasm of breast: Secondary | ICD-10-CM

## 2014-03-21 DIAGNOSIS — M62838 Other muscle spasm: Secondary | ICD-10-CM

## 2014-03-21 DIAGNOSIS — C50911 Malignant neoplasm of unspecified site of right female breast: Secondary | ICD-10-CM

## 2014-03-21 LAB — CBC WITH DIFFERENTIAL/PLATELET
BASO%: 0.2 % (ref 0.0–2.0)
Basophils Absolute: 0 10*3/uL (ref 0.0–0.1)
EOS ABS: 0 10*3/uL (ref 0.0–0.5)
EOS%: 0.4 % (ref 0.0–7.0)
HCT: 28.4 % — ABNORMAL LOW (ref 34.8–46.6)
HGB: 9.2 g/dL — ABNORMAL LOW (ref 11.6–15.9)
LYMPH%: 45.6 % (ref 14.0–49.7)
MCH: 29.9 pg (ref 25.1–34.0)
MCHC: 32.4 g/dL (ref 31.5–36.0)
MCV: 92.2 fL (ref 79.5–101.0)
MONO#: 0.4 10*3/uL (ref 0.1–0.9)
MONO%: 8.4 % (ref 0.0–14.0)
NEUT%: 45.4 % (ref 38.4–76.8)
NEUTROS ABS: 2 10*3/uL (ref 1.5–6.5)
PLATELETS: 208 10*3/uL (ref 145–400)
RBC: 3.08 10*6/uL — ABNORMAL LOW (ref 3.70–5.45)
RDW: 19.1 % — AB (ref 11.2–14.5)
WBC: 4.5 10*3/uL (ref 3.9–10.3)
lymph#: 2.1 10*3/uL (ref 0.9–3.3)

## 2014-03-21 LAB — COMPREHENSIVE METABOLIC PANEL (CC13)
ALK PHOS: 70 U/L (ref 40–150)
ALT: 16 U/L (ref 0–55)
AST: 15 U/L (ref 5–34)
Albumin: 3.7 g/dL (ref 3.5–5.0)
Anion Gap: 13 mEq/L — ABNORMAL HIGH (ref 3–11)
BILIRUBIN TOTAL: 0.54 mg/dL (ref 0.20–1.20)
BUN: 10.8 mg/dL (ref 7.0–26.0)
CHLORIDE: 106 meq/L (ref 98–109)
CO2: 22 mEq/L (ref 22–29)
Calcium: 9.1 mg/dL (ref 8.4–10.4)
Creatinine: 0.8 mg/dL (ref 0.6–1.1)
Glucose: 103 mg/dl (ref 70–140)
Potassium: 3.8 mEq/L (ref 3.5–5.1)
Sodium: 141 mEq/L (ref 136–145)
Total Protein: 6.9 g/dL (ref 6.4–8.3)

## 2014-03-21 MED ORDER — SODIUM CHLORIDE 0.9 % IJ SOLN
10.0000 mL | INTRAMUSCULAR | Status: DC | PRN
  Administered 2014-03-21: 10 mL via INTRAVENOUS
  Filled 2014-03-21: qty 10

## 2014-03-21 MED ORDER — CYCLOBENZAPRINE HCL 5 MG PO TABS: 5.0000 mg | ORAL_TABLET | Freq: Three times a day (TID) | ORAL | Status: DC | PRN

## 2014-03-21 MED ORDER — HEPARIN SOD (PORK) LOCK FLUSH 100 UNIT/ML IV SOLN
500.0000 [IU] | Freq: Once | INTRAVENOUS | Status: AC
  Administered 2014-03-21: 500 [IU] via INTRAVENOUS
  Filled 2014-03-21: qty 5

## 2014-03-21 NOTE — Patient Instructions (Signed)
You will take decadron (dexamethasone, steroid) five of the 4 mg tablets (=20 mg) with food 12 hours before first chemo and five tablets with food 6 hours before first chemo. The first taxol chemotherapy is on 03-22-14 at 9 AM, so you will take steroids with food at 9 PM on 03-21-14 and second dose with food at 3 AM on 03-22-14.  If no problems with the first taxol, we will decrease steroid dose to just the 12 hour prior dose starting with second treatment.

## 2014-03-21 NOTE — Progress Notes (Signed)
OFFICE PROGRESS NOTE     Physicians:F.Barry Dienes, D.Harlow Mares, K.Winifred Olive (GYN), S.Squire  INTERVAL HISTORY:  Patient is seen, together with husband, in continuing attention to adjuvant chemotherapy in process for Stage 1 triple negative left breast cancer, due cycle 1 weekly taxol on 03-22-14. She has completed 4 cycles of carboplatin taxotere given 12-28-13 thru 03-01-14. Significant medical history includes right breast cancer 1996 with treatment including autologous BMT, and multiple BRCA 1 and 2 variants.  Next apt Dr Harlow Mares ~ April.  Patient has had more frequent and severe muscle spasm type discomfort across right chest in past week. She dislikes general dysphoria after Robaxin, previously tolerated prn flexeril for left sided muscle spasms which were intermittent x years after left breast surgery. She is not doing regular stretching, has not had any PT instruction (see below), and tends to fall asleep sitting on couch possibly in awkward positions. She has used Robaxin only a couple of times in past several days, no other pain meds. Hot shower seems to help however she has not tried heating pad (told just post op not to use heating pad then).    Otherwise she feels better overall out this far from last chemo. Nailbeds not sore, no complaints of increased lacrimation. No fever or symptoms of infection. Bowels are moving. No SOB or other chest pain. No LE swelling. She was able to work a few days in last week, tho at least 2 days unable to work due to the muscle spasm symptoms   She has PAC Flu vaccine done Multiple BRCA 1 and 2 variants Treated for left breast cancer including high dose chemo with autologous BMT 1996  ONCOLOGIC HISTORY History is of left breast cancer in June 1996, when she was [redacted] weeks pregnant. This was 4x4x2.5 cm poorly differentiated invasive ductal and comedo carciinoma, ER PR negative, HER 2 not tested in 1996. She was treated with 2 cycles of adriamycin at 60  mg/m2 in [place] as she completed the pregnancy, then had left mastectomy with 12 of 23 left axillary nodes positive. She had additional adriamycin and cytoxan in [place] to total adriamycin dose of 300 mg/m2, followed by high dose chemotherapy with cytoxan, CDDP and autologous transplant on protocol at [facility] (Dr J.Vredenburgh) , and local radiation on left. We have received treatment records from [facility] for high dose chemotherapy and autologous transplant done 1996, which I have reviewed and which will be scanned into this EMR. She had left TRAM reconstruction. She had multiple BRCA 1 and 2 variants at last genetics testing. She had hysterectomy with oophorectomy ~ 08-2009, but was reluctant to proceed with prophylactic right mastectomy. She was followed with mammograms + yearly MRIs due to BRCA abnormalities. Daughter [name] is healthy, age 34. Right breast cancer was identified on tomo mammography at [facility] 09-24-13, with heterogeneously dense breast tissue and suspicious area upper outer quadrant. She had MRI 09-27-13 with 9x5x6 mm mass upper outer quadrant on right, otherwise unremarkable bilaterally. US biopsy at [facility] 10-04-13 [phone]) documented invasive mammary carcinoma ER PR and HER 2 negative and proliferation fraction 87%. After consultation with general surgery, plastic surgery, medical oncology and radiation oncology, she agreed to right mastectomy. She had staging CT CAP 10-13-13, unremarkable other than 5 mm low density lesion in dome of right hepatic lobe likely cyst and scarring left lung apex likely from prior RT.She had right mastectomy with sentinel node evaluation and immediate reconstruction with right latissimus myocutaneous flap + saline implant by Drs  Leafy Half on 11-16-13. [place] pathology [id number] from 11-16-13 had grade 3 invasive ductal carcinoma 0.6 cm with 3 sentinel axillary nodes + 1 nonsentinel node negative, closest margin 1 cm, no LVSI,  repeat ER and PR testing both 0% and not sufficient tumor for repeat HER 2 (which had been negative on diagnostic biopsy 09-30-13 [phone]). She had first carboplatin + taxotere on 12-28-13, with neulasta day 2. She completed cycle 4 carbo taxotere on 03-01-14. First weekly taxol will begin 03-22-14, planned x 12 weeks.    Review of systems as above, also: No fever or symptoms of infection. No bleeding. No swelling UE. ROM at right shoulder does not clearly exacerbate discomfort. Soreness at right posterior flap scar where seems to have palpable SQ sutures. Remainder of 10 point Review of Systems negative.  Objective:  Vital signs in last 24 hours:  BP 126/50 mmHg  Pulse 94  Temp(Src) 98.7 F (37.1 C) (Oral)  Resp 19  Ht 5' 7.5" (1.715 m)  Wt 199 lb 11.2 oz (90.583 kg)  BMI 30.80 kg/m2  SpO2 100%  Weight up 2 lbs Alert, oriented and appropriate. Ambulatory without assistance difficulty.  Alopecia  HEENT:PERRL, sclerae not icteric. Oral mucosa moist without lesions, posterior pharynx clear.  Neck supple. No JVD.  Lymphatics:no cervical,supraclavicular, axillary adenopathy Resp: clear to auscultation bilaterally and normal percussion bilaterally Cardio: regular rate and rhythm. No gallop. GI: soft, nontender, not distended, no mass or organomegaly. Diminished bowel sounds, but present.  Musculoskeletal/ Extremities: without pitting edema, cords, tenderness. Right back with flap scar remarkable for apparent SQ sutures at regular intervals, no other findings of concern. Neuro: no peripheral neuropathy. Otherwise nonfocal. PSYCH appropriate mood and affect Skin without rash, ecchymosis, petechiae. Nailbeds with minimal discoloration. No rash right back or right chest wall Reconstructed breasts bilaterally without findings of concern for recurrence Portacath-without erythema or tenderness  Lab Results:  Results for orders placed or performed in visit on 03/21/14  CBC with  Differential  Result Value Ref Range   WBC 4.5 3.9 - 10.3 10e3/uL   NEUT# 2.0 1.5 - 6.5 10e3/uL   HGB 9.2 (L) 11.6 - 15.9 g/dL   HCT 28.4 (L) 34.8 - 46.6 %   Platelets 208 145 - 400 10e3/uL   MCV 92.2 79.5 - 101.0 fL   MCH 29.9 25.1 - 34.0 pg   MCHC 32.4 31.5 - 36.0 g/dL   RBC 3.08 (L) 3.70 - 5.45 10e6/uL   RDW 19.1 (H) 11.2 - 14.5 %   lymph# 2.1 0.9 - 3.3 10e3/uL   MONO# 0.4 0.1 - 0.9 10e3/uL   Eosinophils Absolute 0.0 0.0 - 0.5 10e3/uL   Basophils Absolute 0.0 0.0 - 0.1 10e3/uL   NEUT% 45.4 38.4 - 76.8 %   LYMPH% 45.6 14.0 - 49.7 %   MONO% 8.4 0.0 - 14.0 %   EOS% 0.4 0.0 - 7.0 %   BASO% 0.2 0.0 - 2.0 %    CMET available after visit normal including T bili 0.54, with exception of AG 13  Studies/Results:  No results found.  Medications: I have reviewed the patient's current medications. Prescription sent for flexeril 5-10 mg q 8 hrs prn muscle spasm, to try instead of robaxin.  DISCUSSION: Probable muscle spasm right chest as above, to try prn flexeril, careful heating pad, careful positioning. After visit I was able to reach Dr Harlow Mares thru his RN, with information that water aerobics and/ or PT would be fine now, tho he does not want any  massage of donor site or of the flap itself (reconstructed right breast). As she is to begin taxol on 03-22-14, I have not made PT referral immediately.  Reviewed times for premed decadron, which will be 20 mg 12 hrs and 6 hrs prior to first taxol then 20 mg 12 hrs prior for subsequent doses as long as no problems cycle 1; each dose with food.  Reviewed possible taxol side effects of allergic reaction to infusion, peripheral neuropathy and aches. Answered husband's questions carefully in this regard.  Assessment/Plan:  1.Triple negative right breast cancer 0.6 cm, 4 nodes negative: post right mastectomy with axillary node evaluation and immediate reconstruction with latissimus flap 11-16-13. adjuvant chemo began with 4 cycles carbo taxotere  12-28-13 thru 03-01-14, with neulasta. She will begin weekly taxol x12 on 03-22-14. I will see her 2-8 prior to #2 taxol on 03-29-14. 2.left breast cancer 1996: 4 cm primary, 12 nodes involved, ER PR negative (HER 2 not tested in 1996), diagnosed when 7 months pregnant, treated with adriamycin prior to delivery, then [facility] to total adria dose 300 mg/m2, then high dose therapy with cytoxan, CDDP, carmustine and autologous BMT at [facility], then RT. No known recurrent disease. Left TRAM reconstruction. Note Dr Isidore Moos was to obtain RT records from 1996 to be sure these are available in present EMR. 3.multiple BRCA 1 and 2 variants 4. PAC  by Dr Barry Dienes  5.zoster shortly after completion of treatment 1996: prophylaxis with acyclovir due to steroids with upcoming chemo 6.flu vaccine done 7.hysterectomy with oophorectomy 2011 8. Constipation resolved.Prn laxatives to keep bowels moving daily. 9.right chest pain seems to be muscle spasm: remotely had similar problems on left after that surgery, improved with prn flexeril. As she is not tolerating robaxin, will try flexeril again. Other interventions as above, may need PT assistance.    MD spoke directly with RN for Dr Harlow Mares, follow up from Dr Harlow Mares as above. Chemo orders confirmed. Patient and husband are comfortable with discussion and plans as above. TIme spent 30 min including >50% counseling and coordination of care.  [place]akale Pindell P, MD   03/21/2014, 10:37 AM

## 2014-03-21 NOTE — Patient Instructions (Signed)

## 2014-03-21 NOTE — Telephone Encounter (Signed)
Request for addnl clinical information rcvd from [facility] for pre-auth of chemo.  Inbasket sent to Christie Beckers and Darlena.

## 2014-03-21 NOTE — Telephone Encounter (Signed)
, °

## 2014-03-21 NOTE — Telephone Encounter (Addendum)
Received call from Butch Penny at Dr. Harlow Mares office stating that it is okay for patient to participate in physical therapy as well as water aerobics. She states the only thing that is not okay is massaging the donor site or actual flap site itself. Information passed along to Dr. Marko Plume. Donna's contact number is [phone].

## 2014-03-22 ENCOUNTER — Ambulatory Visit (HOSPITAL_BASED_OUTPATIENT_CLINIC_OR_DEPARTMENT_OTHER): Payer: 59

## 2014-03-22 ENCOUNTER — Other Ambulatory Visit: Payer: Self-pay | Admitting: Oncology

## 2014-03-22 DIAGNOSIS — Z9889 Other specified postprocedural states: Secondary | ICD-10-CM | POA: Insufficient documentation

## 2014-03-22 DIAGNOSIS — Z5111 Encounter for antineoplastic chemotherapy: Secondary | ICD-10-CM

## 2014-03-22 DIAGNOSIS — M62838 Other muscle spasm: Secondary | ICD-10-CM | POA: Insufficient documentation

## 2014-03-22 DIAGNOSIS — C50411 Malignant neoplasm of upper-outer quadrant of right female breast: Secondary | ICD-10-CM

## 2014-03-22 MED ORDER — FAMOTIDINE IN NACL 20-0.9 MG/50ML-% IV SOLN
20.0000 mg | Freq: Once | INTRAVENOUS | Status: AC
  Administered 2014-03-22: 20 mg via INTRAVENOUS

## 2014-03-22 MED ORDER — SODIUM CHLORIDE 0.9 % IV SOLN
Freq: Once | INTRAVENOUS | Status: AC
  Administered 2014-03-22: 10:00:00 via INTRAVENOUS

## 2014-03-22 MED ORDER — HEPARIN SOD (PORK) LOCK FLUSH 100 UNIT/ML IV SOLN
500.0000 [IU] | Freq: Once | INTRAVENOUS | Status: AC | PRN
  Administered 2014-03-22: 500 [IU]
  Filled 2014-03-22: qty 5

## 2014-03-22 MED ORDER — ONDANSETRON 8 MG/50ML IVPB (CHCC)
8.0000 mg | Freq: Once | INTRAVENOUS | Status: AC
  Administered 2014-03-22: 8 mg via INTRAVENOUS

## 2014-03-22 MED ORDER — DIPHENHYDRAMINE HCL 50 MG/ML IJ SOLN
50.0000 mg | Freq: Once | INTRAMUSCULAR | Status: AC
  Administered 2014-03-22: 50 mg via INTRAVENOUS

## 2014-03-22 MED ORDER — DEXAMETHASONE SODIUM PHOSPHATE 20 MG/5ML IJ SOLN
20.0000 mg | Freq: Once | INTRAMUSCULAR | Status: AC
  Administered 2014-03-22: 20 mg via INTRAVENOUS

## 2014-03-22 MED ORDER — SODIUM CHLORIDE 0.9 % IJ SOLN
10.0000 mL | INTRAMUSCULAR | Status: DC | PRN
  Administered 2014-03-22: 10 mL
  Filled 2014-03-22: qty 10

## 2014-03-22 MED ORDER — PACLITAXEL CHEMO INJECTION 300 MG/50ML
80.0000 mg/m2 | Freq: Once | INTRAVENOUS | Status: AC
  Administered 2014-03-22: 168 mg via INTRAVENOUS
  Filled 2014-03-22: qty 28

## 2014-03-22 MED ORDER — DEXAMETHASONE SODIUM PHOSPHATE 20 MG/5ML IJ SOLN
INTRAMUSCULAR | Status: AC
Start: 2014-03-22 — End: 2014-03-22
  Filled 2014-03-22: qty 5

## 2014-03-22 MED ORDER — ONDANSETRON 8 MG/NS 50 ML IVPB
INTRAVENOUS | Status: AC
  Filled 2014-03-22: qty 8

## 2014-03-22 MED ORDER — DIPHENHYDRAMINE HCL 50 MG/ML IJ SOLN
INTRAMUSCULAR | Status: AC
  Filled 2014-03-22: qty 1

## 2014-03-22 MED ORDER — FAMOTIDINE IN NACL 20-0.9 MG/50ML-% IV SOLN
INTRAVENOUS | Status: AC
Start: 2014-03-22 — End: 2014-03-22
  Filled 2014-03-22: qty 50

## 2014-03-22 NOTE — Progress Notes (Signed)
Taxol being tolerated well.  Increased to goal rate.

## 2014-03-22 NOTE — Progress Notes (Signed)
1st Taxol started at th is time.

## 2014-03-22 NOTE — Progress Notes (Signed)
Quite and resting with this check verses talking about work earlier at 1100 check.  VSS, tolerating taxol well.

## 2014-03-22 NOTE — Patient Instructions (Signed)
[facility] Discharge Instructions for Patients Receiving Chemotherapy  Today you received the following chemotherapy agents: Taxol   To help prevent nausea and vomiting after your treatment, we encourage you to take your nausea medication zofran 8 mg, lorazepam 1 mg   If you develop nausea and vomiting that is not controlled by your nausea medication, call the clinic.   BELOW ARE SYMPTOMS THAT SHOULD BE REPORTED IMMEDIATELY:  *FEVER GREATER THAN 100.5 F  *CHILLS WITH OR WITHOUT FEVER  NAUSEA AND VOMITING THAT IS NOT CONTROLLED WITH YOUR NAUSEA MEDICATION  *UNUSUAL SHORTNESS OF BREATH  *UNUSUAL BRUISING OR BLEEDING  TENDERNESS IN MOUTH AND THROAT WITH OR WITHOUT PRESENCE OF ULCERS  *URINARY PROBLEMS  *BOWEL PROBLEMS  UNUSUAL RASH Items with * indicate a potential emergency and should be followed up as soon as possible.  Feel free to call the clinic you have any questions or concerns. The clinic phone number is [phone].

## 2014-03-22 NOTE — Progress Notes (Signed)
Discharged at 1205, ambulatory, with spouse in no distress.

## 2014-03-22 NOTE — Progress Notes (Signed)
Increased to 3/4 goal rate of taxol, tolerating well.

## 2014-03-22 NOTE — Progress Notes (Signed)
VSS, denies any signs of reaction.  Rate increased to 137ml/hr or 1/2 goal rate

## 2014-03-23 ENCOUNTER — Telehealth: Payer: Self-pay | Admitting: Certified Registered Nurse Anesthetist

## 2014-03-23 NOTE — Telephone Encounter (Signed)
Doing well post chemo yesterday. Pt. Is eating during my call.  No further questions about her medication except for waiting for pharmacy to clarify her iron medications. Pt. Is aware to call [phone] if she has further questions. HL

## 2014-03-25 ENCOUNTER — Encounter: Payer: Self-pay | Admitting: Oncology

## 2014-03-25 NOTE — Progress Notes (Signed)
Taxol is approved from 03/22/14 to 04/01/15. Auth#: [id number]. J codes: 6063, 1100 and 2405 does not req auth.

## 2014-03-27 ENCOUNTER — Other Ambulatory Visit: Payer: Self-pay | Admitting: Oncology

## 2014-03-28 ENCOUNTER — Other Ambulatory Visit (HOSPITAL_BASED_OUTPATIENT_CLINIC_OR_DEPARTMENT_OTHER): Payer: 59

## 2014-03-28 ENCOUNTER — Ambulatory Visit (HOSPITAL_BASED_OUTPATIENT_CLINIC_OR_DEPARTMENT_OTHER): Payer: 59

## 2014-03-28 ENCOUNTER — Ambulatory Visit: Payer: Self-pay | Admitting: Oncology

## 2014-03-28 ENCOUNTER — Encounter: Payer: Self-pay | Admitting: Oncology

## 2014-03-28 ENCOUNTER — Ambulatory Visit (HOSPITAL_BASED_OUTPATIENT_CLINIC_OR_DEPARTMENT_OTHER): Payer: 59 | Admitting: Oncology

## 2014-03-28 ENCOUNTER — Other Ambulatory Visit: Payer: Self-pay

## 2014-03-28 VITALS — BP 118/69 | HR 98 | Temp 98.9°F | Resp 19 | Ht 67.5 in | Wt 200.0 lb

## 2014-03-28 VITALS — BP 106/66 | HR 90 | Temp 98.7°F

## 2014-03-28 DIAGNOSIS — C773 Secondary and unspecified malignant neoplasm of axilla and upper limb lymph nodes: Secondary | ICD-10-CM

## 2014-03-28 DIAGNOSIS — C50411 Malignant neoplasm of upper-outer quadrant of right female breast: Secondary | ICD-10-CM

## 2014-03-28 DIAGNOSIS — M62838 Other muscle spasm: Secondary | ICD-10-CM

## 2014-03-28 DIAGNOSIS — T451X5A Adverse effect of antineoplastic and immunosuppressive drugs, initial encounter: Secondary | ICD-10-CM

## 2014-03-28 DIAGNOSIS — G620 Drug-induced polyneuropathy: Secondary | ICD-10-CM

## 2014-03-28 DIAGNOSIS — D649 Anemia, unspecified: Secondary | ICD-10-CM

## 2014-03-28 DIAGNOSIS — Z853 Personal history of malignant neoplasm of breast: Secondary | ICD-10-CM

## 2014-03-28 DIAGNOSIS — D6481 Anemia due to antineoplastic chemotherapy: Secondary | ICD-10-CM

## 2014-03-28 DIAGNOSIS — Z95828 Presence of other vascular implants and grafts: Secondary | ICD-10-CM

## 2014-03-28 DIAGNOSIS — Z171 Estrogen receptor negative status [ER-]: Secondary | ICD-10-CM

## 2014-03-28 DIAGNOSIS — Z9889 Other specified postprocedural states: Secondary | ICD-10-CM

## 2014-03-28 LAB — CBC WITH DIFFERENTIAL/PLATELET
BASO%: 0.3 % (ref 0.0–2.0)
Basophils Absolute: 0 10*3/uL (ref 0.0–0.1)
EOS ABS: 0.2 10*3/uL (ref 0.0–0.5)
EOS%: 3.6 % (ref 0.0–7.0)
HCT: 27.3 % — ABNORMAL LOW (ref 34.8–46.6)
HGB: 8.6 g/dL — ABNORMAL LOW (ref 11.6–15.9)
LYMPH#: 1.8 10*3/uL (ref 0.9–3.3)
LYMPH%: 41.8 % (ref 14.0–49.7)
MCH: 29.8 pg (ref 25.1–34.0)
MCHC: 31.7 g/dL (ref 31.5–36.0)
MCV: 94 fL (ref 79.5–101.0)
MONO#: 0.2 10*3/uL (ref 0.1–0.9)
MONO%: 4.7 % (ref 0.0–14.0)
NEUT#: 2.1 10*3/uL (ref 1.5–6.5)
NEUT%: 49.6 % (ref 38.4–76.8)
Platelets: 261 10*3/uL (ref 145–400)
RBC: 2.9 10*6/uL — ABNORMAL LOW (ref 3.70–5.45)
RDW: 20 % — AB (ref 11.2–14.5)
WBC: 4.3 10*3/uL (ref 3.9–10.3)

## 2014-03-28 LAB — COMPREHENSIVE METABOLIC PANEL (CC13)
ALK PHOS: 69 U/L (ref 40–150)
ALT: 25 U/L (ref 0–55)
AST: 18 U/L (ref 5–34)
Albumin: 3.7 g/dL (ref 3.5–5.0)
Anion Gap: 10 mEq/L (ref 3–11)
BUN: 14 mg/dL (ref 7.0–26.0)
CO2: 25 meq/L (ref 22–29)
Calcium: 9.2 mg/dL (ref 8.4–10.4)
Chloride: 106 mEq/L (ref 98–109)
Creatinine: 0.7 mg/dL (ref 0.6–1.1)
EGFR: >90 mL/min/{1.73_m2} (ref >90)
GLUCOSE: 97 mg/dL (ref 70–140)
POTASSIUM: 4.1 meq/L (ref 3.5–5.1)
Sodium: 141 mEq/L (ref 136–145)
Total Bilirubin: 0.5 mg/dL (ref 0.20–1.20)
Total Protein: 6.7 g/dL (ref 6.4–8.3)

## 2014-03-28 MED ORDER — HEPARIN SOD (PORK) LOCK FLUSH 100 UNIT/ML IV SOLN
500.0000 [IU] | Freq: Once | INTRAVENOUS | Status: AC
  Administered 2014-03-28: 500 [IU] via INTRAVENOUS
  Filled 2014-03-28: qty 5

## 2014-03-28 MED ORDER — SODIUM CHLORIDE 0.9 % IJ SOLN
10.0000 mL | INTRAMUSCULAR | Status: DC | PRN
  Administered 2014-03-28: 10 mL via INTRAVENOUS
  Filled 2014-03-28: qty 10

## 2014-03-28 NOTE — Progress Notes (Signed)
OFFICE PROGRESS NOTE   March 28, 2014   Physicians:.F.Barry Dienes, D.Harlow Mares, K.Winifred Olive (GYN), S.Squire  INTERVAL HISTORY:  Patient is seen, together with husband, in continuing attention to adjuvant chemotherapy in process for stage 1 triple negative left breast cancer, due #2 weekly taxol on 03-29-14.  Heidi Sexton did well with first weekly taxol on 03-22-14, with no nausea and no severe taxol aches. She felt well for 2 days after treatment, then tired for next 2 days. Muscle spasms across right chest abated without flexeril, tho she has been more careful with positioning and will pick up flexeril from pharmacy today in case needed. She did not start Hemocyte, however this should be available from pharmacy today per my phone conversation now with pharmacist. Bowels are moving. She has slight neuropathy in feet and fingers, and I have encouraged massage and lots of movement. (Note she had high dose chemo including CDDP in 1996). She has had no significant nausea.  Dr Harlow Mares is in agreement with PT or water exercise now, but does not want any massage to donor site or reconstructed right breast.  She has PAC Flu vaccine done Multiple BRCA 1 and 2 variants Treated for left breast cancer including high dose chemo with autologous BMT 1996   ONCOLOGIC HISTORY  History is of left breast cancer in June 1996, when she was [redacted] weeks pregnant. This was 4x4x2.5 cm poorly differentiated invasive ductal and comedo carciinoma, ER PR negative, HER 2 not tested in 1996. She was treated with 2 cycles of adriamycin at 60 mg/m2 in [place] as she completed the pregnancy, then had left mastectomy with 12 of 23 left axillary nodes positive. She had additional adriamycin and cytoxan in [place] to total adriamycin dose of 300 mg/m2, followed by high dose chemotherapy with cytoxan, CDDP and autologous transplant on protocol at [place] (Dr J.Vredenburgh) , and local radiation on left. We have received  treatment records from [facility] for high dose chemotherapy and autologous transplant done 1996, which I have reviewed and which will be scanned into this EMR. She had left TRAM reconstruction. She had multiple BRCA 1 and 2 variants at last genetics testing. She had hysterectomy with oophorectomy ~ 08-2009, but was reluctant to proceed with prophylactic right mastectomy. She was followed with mammograms + yearly MRIs due to BRCA abnormalities. Daughter Heidi Sexton is healthy, age 90. Right breast cancer was identified on tomo mammography at [facility] 09-24-13, with heterogeneously dense breast tissue and suspicious area upper outer quadrant. She had MRI 09-27-13 with 9x5x6 mm mass upper outer quadrant on right, otherwise unremarkable bilaterally. US biopsy at [facility] 10-04-13 [phone]) documented invasive mammary carcinoma ER PR and HER 2 negative and proliferation fraction 87%. After consultation with general surgery, plastic surgery, medical oncology and radiation oncology, she agreed to right mastectomy. She had staging CT CAP 10-13-13, unremarkable other than 5 mm low density lesion in dome of right hepatic lobe likely cyst and scarring left lung apex likely from prior RT.She had right mastectomy with sentinel node evaluation and immediate reconstruction with right latissimus myocutaneous flap + saline implant by Drs Barry Dienes and Harlow Mares on 11-16-13. [place] pathology [phone] from 11-16-13 had grade 3 invasive ductal carcinoma 0.6 cm with 3 sentinel axillary nodes + 1 nonsentinel node negative, closest margin 1 cm, no LVSI, repeat ER and PR testing both 0% and not sufficient tumor for repeat HER 2 (which had been negative on diagnostic biopsy 09-30-13 [phone]). She had first carboplatin + taxotere on 12-28-13, with neulasta  day 2. She completed cycle 4 carbo taxotere on 03-01-14. First weekly taxol will begin 03-22-14, planned x 12 weeks.    Review of systems as above, also: Appetite ok. No fever or  symptoms of infection. No bleeding. Not SOB with limited exertion. Remainder of 10 point Review of Systems negative.  Objective:  Vital signs in last 24 hours:  BP 118/69 mmHg  Pulse 98  Temp(Src) 98.9 F (37.2 C) (Oral)  Resp 19  Ht 5' 7.5" (1.715 m)  Wt 200 lb (90.719 kg)  BMI 30.84 kg/m2  SpO2 100% Weight stable Alert, oriented and appropriate. Ambulatory without difficulty.  Alopecia  HEENT:PERRL, sclerae not icteric. Oral mucosa moist without lesions, posterior pharynx clear.  Neck supple. No JVD.  Lymphatics:no cervical,supraclavicular, axillary adenopathy Resp: clear to auscultation bilaterally and normal percussion bilaterally Cardio: regular rate and rhythm. No gallop. GI: soft, nontender, not distended, no mass or organomegaly. Normally active bowel sounds.  Musculoskeletal/ Extremities: without pitting edema, cords, tenderness .  Surgical incision right back not tender.  Neuro: no significant peripheral neuropathy (see above). Otherwise nonfocal. PSYCH appropriate mood and affect Skin without rash, ecchymosis, petechiae Breasts:bilateral reconstructions without dominant mass, skin or nipple findings. Axillae benign. Portacath-without erythema or tenderness  Lab Results:  Results for orders placed or performed in visit on 03/28/14  CBC with Differential  Result Value Ref Range   WBC 4.3 3.9 - 10.3 10e3/uL   NEUT# 2.1 1.5 - 6.5 10e3/uL   HGB 8.6 (L) 11.6 - 15.9 g/dL   HCT 27.3 (L) 34.8 - 46.6 %   Platelets 261 145 - 400 10e3/uL   MCV 94.0 79.5 - 101.0 fL   MCH 29.8 25.1 - 34.0 pg   MCHC 31.7 31.5 - 36.0 g/dL   RBC 2.90 (L) 3.70 - 5.45 10e6/uL   RDW 20.0 (H) 11.2 - 14.5 %   lymph# 1.8 0.9 - 3.3 10e3/uL   MONO# 0.2 0.1 - 0.9 10e3/uL   Eosinophils Absolute 0.2 0.0 - 0.5 10e3/uL   Basophils Absolute 0.0 0.0 - 0.1 10e3/uL   NEUT% 49.6 38.4 - 76.8 %   LYMPH% 41.8 14.0 - 49.7 %   MONO% 4.7 0.0 - 14.0 %   EOS% 3.6 0.0 - 7.0 %   BASO% 0.3 0.0 - 2.0 %    CMET  available after visit normal  Iron studies ordered with next labs  Studies/Results:  No results found.  Medications: I have reviewed the patient's current medications. Flexeril and hemocyte to be available from pharmacy today. Will decrease premed decadron for taxol to 20 mg 12 hrs prior to treatment.  DISCUSSION: discussed taxol neuropathy, which hopefully will not require discontinuation of this drug, tho we will certainly be very aware of the neuropathy.  Assessment/Plan:   1.Triple negative right breast cancer 0.6 cm, 4 nodes negative: post right mastectomy with axillary node evaluation and immediate reconstruction with latissimus flap 11-16-13. adjuvant chemo began with 4 cycles carbo taxotere 12-28-13 thru 03-01-14, with neulasta. Weekly taxol x12 begun 03-22-14. I will see her on 2-22 prior to treatment 2-23. She will be treated weekly as long as ANC >=1.2 and plt >=100k. 2.left breast cancer 1996: 4 cm primary, 12 nodes involved, ER PR negative (HER 2 not tested in 1996), diagnosed when 7 months pregnant, treated with adriamycin prior to delivery, then [facility] to total adria dose 300 mg/m2, then high dose therapy with cytoxan, CDDP, carmustine and autologous BMT at [facility], then RT. No known recurrent disease. Left TRAM reconstruction.  Note Dr Isidore Moos was to obtain RT records from 1996 to be sure these are available in present EMR. 3.multiple BRCA 1 and 2 variants 4. PAC by Dr Barry Dienes  5.zoster shortly after completion of treatment 1996: prophylaxis with acyclovir due to steroids with upcoming chemo 6.flu vaccine done 7.hysterectomy with oophorectomy 2011 8. Constipation resolved.Prn laxatives to keep bowels moving daily. 9.right chest pain seems to be muscle spasm: remotely had similar problems on left after that surgery. Improved with careful positioning, can use prn flexeril. She is interested in water aerobics, but may need to wait until present chemo allows.    All questions answered  and patient/ husband are in agreement with plan. Chemo orders confirmed. Time spent 25 min including >50% counseling and coordination of care.  Heidi Sexton P, MD   03/28/2014, 9:56 AM

## 2014-03-28 NOTE — Patient Instructions (Signed)

## 2014-03-28 NOTE — Patient Instructions (Signed)
Decrease decadron (dexamethasone, steroid) to five tablets (=20 mg) with food 12 hours before taxol

## 2014-03-29 ENCOUNTER — Ambulatory Visit (HOSPITAL_BASED_OUTPATIENT_CLINIC_OR_DEPARTMENT_OTHER): Payer: 59

## 2014-03-29 DIAGNOSIS — C50411 Malignant neoplasm of upper-outer quadrant of right female breast: Secondary | ICD-10-CM

## 2014-03-29 DIAGNOSIS — Z5111 Encounter for antineoplastic chemotherapy: Secondary | ICD-10-CM

## 2014-03-29 MED ORDER — DEXAMETHASONE SODIUM PHOSPHATE 20 MG/5ML IJ SOLN
20.0000 mg | Freq: Once | INTRAMUSCULAR | Status: AC
  Administered 2014-03-29: 20 mg via INTRAVENOUS

## 2014-03-29 MED ORDER — SODIUM CHLORIDE 0.9 % IJ SOLN
10.0000 mL | INTRAMUSCULAR | Status: DC | PRN
  Administered 2014-03-29: 10 mL
  Filled 2014-03-29: qty 10

## 2014-03-29 MED ORDER — ONDANSETRON 8 MG/NS 50 ML IVPB
INTRAVENOUS | Status: AC
  Filled 2014-03-29: qty 8

## 2014-03-29 MED ORDER — DIPHENHYDRAMINE HCL 50 MG/ML IJ SOLN
INTRAMUSCULAR | Status: AC
  Filled 2014-03-29: qty 1

## 2014-03-29 MED ORDER — ONDANSETRON 8 MG/50ML IVPB (CHCC)
8.0000 mg | Freq: Once | INTRAVENOUS | Status: AC
  Administered 2014-03-29: 8 mg via INTRAVENOUS

## 2014-03-29 MED ORDER — DEXTROSE 5 % IV SOLN
80.0000 mg/m2 | Freq: Once | INTRAVENOUS | Status: AC
  Administered 2014-03-29: 168 mg via INTRAVENOUS
  Filled 2014-03-29: qty 28

## 2014-03-29 MED ORDER — FAMOTIDINE IN NACL 20-0.9 MG/50ML-% IV SOLN
INTRAVENOUS | Status: AC
  Filled 2014-03-29: qty 50

## 2014-03-29 MED ORDER — SODIUM CHLORIDE 0.9 % IV SOLN
Freq: Once | INTRAVENOUS | Status: AC
  Administered 2014-03-29: 09:00:00 via INTRAVENOUS

## 2014-03-29 MED ORDER — DIPHENHYDRAMINE HCL 50 MG/ML IJ SOLN
50.0000 mg | Freq: Once | INTRAMUSCULAR | Status: AC
  Administered 2014-03-29: 50 mg via INTRAVENOUS

## 2014-03-29 MED ORDER — DEXAMETHASONE SODIUM PHOSPHATE 20 MG/5ML IJ SOLN
INTRAMUSCULAR | Status: AC
  Filled 2014-03-29: qty 5

## 2014-03-29 MED ORDER — FAMOTIDINE IN NACL 20-0.9 MG/50ML-% IV SOLN
20.0000 mg | Freq: Once | INTRAVENOUS | Status: AC
  Administered 2014-03-29: 20 mg via INTRAVENOUS

## 2014-03-29 MED ORDER — HEPARIN SOD (PORK) LOCK FLUSH 100 UNIT/ML IV SOLN
500.0000 [IU] | Freq: Once | INTRAVENOUS | Status: AC | PRN
  Administered 2014-03-29: 500 [IU]
  Filled 2014-03-29: qty 5

## 2014-03-29 NOTE — Patient Instructions (Signed)
[facility] Discharge Instructions for Patients Receiving Chemotherapy  Today you received the following chemotherapy agents: Taxol.  To help prevent nausea and vomiting after your treatment, we encourage you to take your nausea medication as prescribed.   If you develop nausea and vomiting that is not controlled by your nausea medication, call the clinic.   BELOW ARE SYMPTOMS THAT SHOULD BE REPORTED IMMEDIATELY:  *FEVER GREATER THAN 100.5 F  *CHILLS WITH OR WITHOUT FEVER  NAUSEA AND VOMITING THAT IS NOT CONTROLLED WITH YOUR NAUSEA MEDICATION  *UNUSUAL SHORTNESS OF BREATH  *UNUSUAL BRUISING OR BLEEDING  TENDERNESS IN MOUTH AND THROAT WITH OR WITHOUT PRESENCE OF ULCERS  *URINARY PROBLEMS  *BOWEL PROBLEMS  UNUSUAL RASH Items with * indicate a potential emergency and should be followed up as soon as possible.  Feel free to call the clinic you have any questions or concerns. The clinic phone number is [phone].    

## 2014-04-05 ENCOUNTER — Ambulatory Visit (HOSPITAL_BASED_OUTPATIENT_CLINIC_OR_DEPARTMENT_OTHER): Payer: 59

## 2014-04-05 ENCOUNTER — Other Ambulatory Visit (HOSPITAL_BASED_OUTPATIENT_CLINIC_OR_DEPARTMENT_OTHER): Payer: 59

## 2014-04-05 ENCOUNTER — Ambulatory Visit: Payer: 59

## 2014-04-05 DIAGNOSIS — C773 Secondary and unspecified malignant neoplasm of axilla and upper limb lymph nodes: Secondary | ICD-10-CM

## 2014-04-05 DIAGNOSIS — D649 Anemia, unspecified: Secondary | ICD-10-CM

## 2014-04-05 DIAGNOSIS — C50411 Malignant neoplasm of upper-outer quadrant of right female breast: Secondary | ICD-10-CM

## 2014-04-05 DIAGNOSIS — Z95828 Presence of other vascular implants and grafts: Secondary | ICD-10-CM

## 2014-04-05 DIAGNOSIS — Z5111 Encounter for antineoplastic chemotherapy: Secondary | ICD-10-CM

## 2014-04-05 LAB — COMPREHENSIVE METABOLIC PANEL (CC13)
ALK PHOS: 66 U/L (ref 40–150)
ALT: 17 U/L (ref 0–55)
AST: 13 U/L (ref 5–34)
Albumin: 3.6 g/dL (ref 3.5–5.0)
Anion Gap: 11 mEq/L (ref 3–11)
BUN: 14.8 mg/dL (ref 7.0–26.0)
CO2: 20 mEq/L — ABNORMAL LOW (ref 22–29)
Calcium: 9.3 mg/dL (ref 8.4–10.4)
Chloride: 108 mEq/L (ref 98–109)
Creatinine: 0.8 mg/dL (ref 0.6–1.1)
GLUCOSE: 151 mg/dL — AB (ref 70–140)
POTASSIUM: 4.2 meq/L (ref 3.5–5.1)
Sodium: 139 mEq/L (ref 136–145)
Total Bilirubin: 0.39 mg/dL (ref 0.20–1.20)
Total Protein: 6.7 g/dL (ref 6.4–8.3)

## 2014-04-05 LAB — CBC WITH DIFFERENTIAL/PLATELET
BASO%: 0.4 % (ref 0.0–2.0)
BASOS ABS: 0 10*3/uL (ref 0.0–0.1)
EOS%: 0.1 % (ref 0.0–7.0)
Eosinophils Absolute: 0 10*3/uL (ref 0.0–0.5)
HCT: 27.4 % — ABNORMAL LOW (ref 34.8–46.6)
HGB: 8.6 g/dL — ABNORMAL LOW (ref 11.6–15.9)
LYMPH%: 10.2 % — ABNORMAL LOW (ref 14.0–49.7)
MCH: 30.1 pg (ref 25.1–34.0)
MCHC: 31.3 g/dL — ABNORMAL LOW (ref 31.5–36.0)
MCV: 96.2 fL (ref 79.5–101.0)
MONO#: 0 10*3/uL — ABNORMAL LOW (ref 0.1–0.9)
MONO%: 0.6 % (ref 0.0–14.0)
NEUT#: 3.3 10*3/uL (ref 1.5–6.5)
NEUT%: 88.7 % — ABNORMAL HIGH (ref 38.4–76.8)
Platelets: 287 10*3/uL (ref 145–400)
RBC: 2.85 10*6/uL — AB (ref 3.70–5.45)
RDW: 20.4 % — ABNORMAL HIGH (ref 11.2–14.5)
WBC: 3.8 10*3/uL — ABNORMAL LOW (ref 3.9–10.3)
lymph#: 0.4 10*3/uL — ABNORMAL LOW (ref 0.9–3.3)

## 2014-04-05 LAB — FERRITIN CHCC: Ferritin: 178 ng/ml (ref 9–269)

## 2014-04-05 LAB — IRON AND TIBC CHCC
%SAT: 19 % — AB (ref 21–57)
Iron: 68 ug/dL (ref 41–142)
TIBC: 365 ug/dL (ref 236–444)
UIBC: 297 ug/dL (ref 120–384)

## 2014-04-05 MED ORDER — SODIUM CHLORIDE 0.9 % IV SOLN
Freq: Once | INTRAVENOUS | Status: AC
  Administered 2014-04-05: 12:00:00 via INTRAVENOUS

## 2014-04-05 MED ORDER — DEXAMETHASONE SODIUM PHOSPHATE 20 MG/5ML IJ SOLN
20.0000 mg | Freq: Once | INTRAMUSCULAR | Status: AC
  Administered 2014-04-05: 20 mg via INTRAVENOUS

## 2014-04-05 MED ORDER — HEPARIN SOD (PORK) LOCK FLUSH 100 UNIT/ML IV SOLN
500.0000 [IU] | Freq: Once | INTRAVENOUS | Status: AC | PRN
  Administered 2014-04-05: 500 [IU]
  Filled 2014-04-05: qty 5

## 2014-04-05 MED ORDER — DIPHENHYDRAMINE HCL 50 MG/ML IJ SOLN
50.0000 mg | Freq: Once | INTRAMUSCULAR | Status: AC
  Administered 2014-04-05: 50 mg via INTRAVENOUS

## 2014-04-05 MED ORDER — FAMOTIDINE IN NACL 20-0.9 MG/50ML-% IV SOLN
INTRAVENOUS | Status: AC
  Filled 2014-04-05: qty 50

## 2014-04-05 MED ORDER — SODIUM CHLORIDE 0.9 % IJ SOLN
10.0000 mL | INTRAMUSCULAR | Status: DC | PRN
  Administered 2014-04-05: 10 mL
  Filled 2014-04-05: qty 10

## 2014-04-05 MED ORDER — SODIUM CHLORIDE 0.9 % IJ SOLN
10.0000 mL | INTRAMUSCULAR | Status: DC | PRN
  Administered 2014-04-05: 10 mL via INTRAVENOUS
  Filled 2014-04-05: qty 10

## 2014-04-05 MED ORDER — DIPHENHYDRAMINE HCL 50 MG/ML IJ SOLN
INTRAMUSCULAR | Status: AC
  Filled 2014-04-05: qty 1

## 2014-04-05 MED ORDER — ONDANSETRON 8 MG/NS 50 ML IVPB
INTRAVENOUS | Status: AC
  Filled 2014-04-05: qty 8

## 2014-04-05 MED ORDER — DEXAMETHASONE SODIUM PHOSPHATE 20 MG/5ML IJ SOLN
INTRAMUSCULAR | Status: AC
  Filled 2014-04-05: qty 5

## 2014-04-05 MED ORDER — PACLITAXEL CHEMO INJECTION 300 MG/50ML
80.0000 mg/m2 | Freq: Once | INTRAVENOUS | Status: AC
  Administered 2014-04-05: 168 mg via INTRAVENOUS
  Filled 2014-04-05: qty 28

## 2014-04-05 MED ORDER — ONDANSETRON 8 MG/50ML IVPB (CHCC)
8.0000 mg | Freq: Once | INTRAVENOUS | Status: AC
  Administered 2014-04-05: 8 mg via INTRAVENOUS

## 2014-04-05 MED ORDER — FAMOTIDINE IN NACL 20-0.9 MG/50ML-% IV SOLN
20.0000 mg | Freq: Once | INTRAVENOUS | Status: AC
  Administered 2014-04-05: 20 mg via INTRAVENOUS

## 2014-04-05 NOTE — Patient Instructions (Signed)

## 2014-04-10 ENCOUNTER — Other Ambulatory Visit: Payer: Self-pay | Admitting: Oncology

## 2014-04-11 ENCOUNTER — Encounter: Payer: Self-pay | Admitting: Oncology

## 2014-04-11 ENCOUNTER — Ambulatory Visit (HOSPITAL_BASED_OUTPATIENT_CLINIC_OR_DEPARTMENT_OTHER): Payer: 59

## 2014-04-11 ENCOUNTER — Ambulatory Visit (HOSPITAL_BASED_OUTPATIENT_CLINIC_OR_DEPARTMENT_OTHER): Payer: 59 | Admitting: Oncology

## 2014-04-11 ENCOUNTER — Telehealth: Payer: Self-pay | Admitting: Oncology

## 2014-04-11 ENCOUNTER — Other Ambulatory Visit (HOSPITAL_BASED_OUTPATIENT_CLINIC_OR_DEPARTMENT_OTHER): Payer: 59

## 2014-04-11 VITALS — BP 128/60 | HR 90 | Temp 97.5°F | Resp 18 | Ht 67.5 in | Wt 200.9 lb

## 2014-04-11 DIAGNOSIS — Z95828 Presence of other vascular implants and grafts: Secondary | ICD-10-CM

## 2014-04-11 DIAGNOSIS — C50411 Malignant neoplasm of upper-outer quadrant of right female breast: Secondary | ICD-10-CM

## 2014-04-11 DIAGNOSIS — C50912 Malignant neoplasm of unspecified site of left female breast: Secondary | ICD-10-CM

## 2014-04-11 DIAGNOSIS — D6481 Anemia due to antineoplastic chemotherapy: Secondary | ICD-10-CM

## 2014-04-11 DIAGNOSIS — D701 Agranulocytosis secondary to cancer chemotherapy: Secondary | ICD-10-CM

## 2014-04-11 DIAGNOSIS — T451X5A Adverse effect of antineoplastic and immunosuppressive drugs, initial encounter: Secondary | ICD-10-CM

## 2014-04-11 DIAGNOSIS — G620 Drug-induced polyneuropathy: Secondary | ICD-10-CM

## 2014-04-11 LAB — CBC WITH DIFFERENTIAL/PLATELET
BASO%: 0.3 % (ref 0.0–2.0)
Basophils Absolute: 0 10*3/uL (ref 0.0–0.1)
EOS%: 1.7 % (ref 0.0–7.0)
Eosinophils Absolute: 0.1 10*3/uL (ref 0.0–0.5)
HEMATOCRIT: 26.3 % — AB (ref 34.8–46.6)
HGB: 8.4 g/dL — ABNORMAL LOW (ref 11.6–15.9)
LYMPH#: 1.3 10*3/uL (ref 0.9–3.3)
LYMPH%: 43.1 % (ref 14.0–49.7)
MCH: 30.8 pg (ref 25.1–34.0)
MCHC: 31.9 g/dL (ref 31.5–36.0)
MCV: 96.3 fL (ref 79.5–101.0)
MONO#: 0.1 10*3/uL (ref 0.1–0.9)
MONO%: 4.8 % (ref 0.0–14.0)
NEUT#: 1.5 10*3/uL (ref 1.5–6.5)
NEUT%: 50.1 % (ref 38.4–76.8)
Platelets: 258 10*3/uL (ref 145–400)
RBC: 2.74 10*6/uL — ABNORMAL LOW (ref 3.70–5.45)
RDW: 20.1 % — ABNORMAL HIGH (ref 11.2–14.5)
WBC: 3.1 10*3/uL — AB (ref 3.9–10.3)

## 2014-04-11 LAB — COMPREHENSIVE METABOLIC PANEL (CC13)
ALBUMIN: 3.5 g/dL (ref 3.5–5.0)
ALT: 19 U/L (ref 0–55)
AST: 17 U/L (ref 5–34)
Alkaline Phosphatase: 59 U/L (ref 40–150)
Anion Gap: 6 mEq/L (ref 3–11)
BUN: 10.8 mg/dL (ref 7.0–26.0)
CO2: 25 mEq/L (ref 22–29)
CREATININE: 0.8 mg/dL (ref 0.6–1.1)
Calcium: 9.1 mg/dL (ref 8.4–10.4)
Chloride: 109 mEq/L (ref 98–109)
EGFR: >90 mL/min/{1.73_m2} (ref >90)
GLUCOSE: 118 mg/dL (ref 70–140)
POTASSIUM: 3.9 meq/L (ref 3.5–5.1)
Sodium: 140 mEq/L (ref 136–145)
Total Bilirubin: 0.66 mg/dL (ref 0.20–1.20)
Total Protein: 6.2 g/dL — ABNORMAL LOW (ref 6.4–8.3)

## 2014-04-11 MED ORDER — HEPARIN SOD (PORK) LOCK FLUSH 100 UNIT/ML IV SOLN
500.0000 [IU] | Freq: Once | INTRAVENOUS | Status: AC
  Administered 2014-04-11: 500 [IU] via INTRAVENOUS
  Filled 2014-04-11: qty 5

## 2014-04-11 MED ORDER — SODIUM CHLORIDE 0.9 % IJ SOLN
10.0000 mL | INTRAMUSCULAR | Status: DC | PRN
  Administered 2014-04-11: 10 mL via INTRAVENOUS
  Filled 2014-04-11: qty 10

## 2014-04-11 MED ORDER — DEXAMETHASONE 4 MG PO TABS: ORAL_TABLET | ORAL | Status: DC

## 2014-04-11 NOTE — Progress Notes (Signed)
OFFICE PROGRESS NOTE   April 11, 2014   Physicians:F.Barry Dienes, D.Harlow Mares, K.Winifred Olive (GYN), S.Squire  INTERVAL HISTORY:  Patient is seen, together with husband, in continuing attention to adjuvant chemotherapy in progress for stage 1 triple negative left breast cancer, due week 4 taxol on 04-12-14. She has slightly increasing peripheral neuropathy in feet and mild anemia, but is otherwise tolerating chemotherapy remarkable well given past oncologic history.  Peripheral neuropathy is tingling in soles of feet intermittently, not interfering with walking or preventing her from sleeping; she does not have significant neuropathy in fingers. She has not had nausea or vomiting, is eating generally well and bowels are moving. She has had muscle spasm right back to anterior chest intermittently, has not tried the flexeril. She has "flashes of light" at periphery of vision bilaterally at times, most noticeable when she is in dark room, no HA and no changes in vision otherwise; she will follow up with ophthalmology (Dr Einar Gip). She has no other neurologic symptoms. She has not seen bleeding, denies increased SOB with present activity. She is taking oral iron with Vit C, but does not like doing this in AM and can certainly take later in day as long as 1 hr before or 2 hrs after eating.  PAC Flu vaccine done Multiple BRCA 1 and 2 variants Treated for left breast cancer including high dose chemo with autologous BMT 1996    ONCOLOGIC HISTORY History is of left breast cancer in June 1996, when she was [redacted] weeks pregnant. This was 4x4x2.5 cm poorly differentiated invasive ductal and comedo carciinoma, ER PR negative, HER 2 not tested in 1996. She was treated with 2 cycles of adriamycin at 60 mg/m2 in [place] as she completed the pregnancy, then had left mastectomy with 12 of 23 left axillary nodes positive. She had additional adriamycin and cytoxan in [place] to total adriamycin dose of 300  mg/m2, followed by high dose chemotherapy with cytoxan, CDDP and autologous transplant on protocol at [place] (Dr J.Vredenburgh) , and local radiation on left. We have received treatment records from [facility] for high dose chemotherapy and autologous transplant done 1996, which I have reviewed and which will be scanned into this EMR. She had left TRAM reconstruction. She had multiple BRCA 1 and 2 variants at last genetics testing. She had hysterectomy with oophorectomy ~ 54-2011, but was reluctant to proceed with prophylactic right mastectomy. She was followed with mammograms + yearly MRIs due to BRCA abnormalities. Daughter [name] is healthy, age 52. Right breast cancer was identified on tomo mammography at [facility] 09-24-13, with heterogeneously dense breast tissue and suspicious area upper outer quadrant. She had MRI 09-27-13 with 9x5x6 mm mass upper outer quadrant on right, otherwise unremarkable bilaterally. US biopsy at [facility] 10-04-13 [phone]) documented invasive mammary carcinoma ER PR and HER 2 negative and proliferation fraction 87%. After consultation with general surgery, plastic surgery, medical oncology and radiation oncology, she agreed to right mastectomy. She had staging CT CAP 10-13-13, unremarkable other than 5 mm low density lesion in dome of right hepatic lobe likely cyst and scarring left lung apex likely from prior RT.She had right mastectomy with sentinel node evaluation and immediate reconstruction with right latissimus myocutaneous flap + saline implant by Drs Barry Dienes and Harlow Mares on 11-16-13. [place] pathology [phone] from 11-16-13 had grade 3 invasive ductal carcinoma 0.6 cm with 3 sentinel axillary nodes + 1 nonsentinel node negative, closest margin 1 cm, no LVSI, repeat ER and PR testing both 0% and not  sufficient tumor for repeat HER 2 (which had been negative on diagnostic biopsy 09-30-13 [phone]). She had first carboplatin + taxotere on 12-28-13, with neulasta day  2. She completed cycle 4 carbo taxotere on 03-01-14. First weekly taxol will begin 03-22-14, planned x 12 weeks.    Review of systems as above, also:  Remainder of 10 point Review of Systems negative.  Objective:  Vital signs in last 24 hours:  BP 128/60 mmHg  Pulse 90  Temp(Src) 97.5 F (36.4 C) (Oral)  Resp 18  Ht 5' 7.5" (1.715 m)  Wt 200 lb 14.4 oz (91.128 kg)  BMI 30.98 kg/m2 Weight up 1 lb Alert, oriented and appropriate. Ambulatory without difficulty.  Alopecia  HEENT:PERRL, sclerae not icteric. Oral mucosa moist without lesions, posterior pharynx clear.  Neck supple. No JVD.  Lymphatics:no cervical,supraclavicular, axillary adenopathy Resp: clear to auscultation bilaterally and normal percussion bilaterally Cardio: regular rate and rhythm. No gallop. GI: soft, nontender, not distended, no mass or organomegaly. Normally active bowel sounds.  Musculoskeletal/ Extremities: without pitting edema, cords, tenderness. Surgical incision right back not remarkable. No tenderness costochondral junctions on right. Neuro: peripheral neuropathy feet as noted. Otherwise nonfocal. PSYCH  Appropriate mood and affect Skin without rash, ecchymosis, petechiae Breasts: bilateral reconstructions without evidence of local recurrence. Axillae benign. Portacath-without erythema or tenderness  Lab Results:  Results for orders placed or performed in visit on 04/11/14  CBC with Differential  Result Value Ref Range   WBC 3.1 (L) 3.9 - 10.3 10e3/uL   NEUT# 1.5 1.5 - 6.5 10e3/uL   HGB 8.4 (L) 11.6 - 15.9 g/dL   HCT 26.3 (L) 34.8 - 46.6 %   Platelets 258 145 - 400 10e3/uL   MCV 96.3 79.5 - 101.0 fL   MCH 30.8 25.1 - 34.0 pg   MCHC 31.9 31.5 - 36.0 g/dL   RBC 2.74 (L) 3.70 - 5.45 10e6/uL   RDW 20.1 (H) 11.2 - 14.5 %   lymph# 1.3 0.9 - 3.3 10e3/uL   MONO# 0.1 0.1 - 0.9 10e3/uL   Eosinophils Absolute 0.1 0.0 - 0.5 10e3/uL   Basophils Absolute 0.0 0.0 - 0.1 10e3/uL   NEUT% 50.1 38.4 - 76.8 %    LYMPH% 43.1 14.0 - 49.7 %   MONO% 4.8 0.0 - 14.0 %   EOS% 1.7 0.0 - 7.0 %   BASO% 0.3 0.0 - 2.0 %   Chemistries available after visit: normal CMET other than total protein 6.2   Iron studies 04-05-14  Serum iron 68, %sat 19, ferritin 178  Studies/Results:  No results found.  Medications: I have reviewed the patient's current medications. Continue oral iron, adjust time of administration.  DISCUSSION: she understands that we need to balance concern about taxol peripheral neuropathy with treatment needs for the triple negative breast cancer. Present neuropathy fortunately is still fairly minimal and she is in agreement with continuing treatment at present. She will begin using a water massage that she has at home, and continue lots of movement of feet. ANC adequate to treat on 2-23, but lower than previously. I have recommended granix day after each treatment and she agrees, orders placed.  She is aware that she may have more bone aches with granix.  Assessment/Plan:  1.Triple negative right breast cancer 0.6 cm, 4 nodes negative: post right mastectomy with axillary node evaluation and immediate reconstruction with latissimus flap 11-16-13. adjuvant chemo began with 4 cycles carbo taxotere 12-28-13 thru 03-01-14, with neulasta, then weekly taxol x12 begun 03-22-14. Add granix  day after each chemo beginning 04-13-14  2.left breast cancer 1996: 4 cm primary, 12 nodes involved, ER PR negative (HER 2 not tested in 1996), diagnosed when 7 months pregnant, treated with adriamycin prior to delivery, then [facility] to total adria dose 300 mg/m2, then high dose therapy with cytoxan, CDDP, carmustine and autologous BMT at [facility], then RT. No known recurrent disease. Left TRAM reconstruction.  3.multiple BRCA 1 and 2 variants 4. PAC by Dr Barry Dienes  5.zoster shortly after completion of treatment 1996: prophylaxis with acyclovir due to steroids with upcoming chemo 6.flu vaccine done 7.hysterectomy with oophorectomy  2011 8. Constipation resolved.Prn laxatives to keep bowels moving daily. 9.right chest pain seems to be muscle spasm: has flexeril if robaxin not tolerated. 10. Visual changes with flashing lights at peripheral vision intermittently: possible visual migraine, does not seem associated with zofran, to follow up with ophthalmology. 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 Patient and husband are in agreement with plans above and know that they can call prior to next scheduled visit if needed. Chemo orders and granix in place.  LIVESAY,LENNIS P, MD   04/11/2014, 10:39 AM

## 2014-04-11 NOTE — Patient Instructions (Signed)

## 2014-04-11 NOTE — Telephone Encounter (Signed)
, °

## 2014-04-12 ENCOUNTER — Ambulatory Visit (HOSPITAL_BASED_OUTPATIENT_CLINIC_OR_DEPARTMENT_OTHER): Payer: 59

## 2014-04-12 DIAGNOSIS — Z5111 Encounter for antineoplastic chemotherapy: Secondary | ICD-10-CM

## 2014-04-12 DIAGNOSIS — C50411 Malignant neoplasm of upper-outer quadrant of right female breast: Secondary | ICD-10-CM

## 2014-04-12 MED ORDER — DIPHENHYDRAMINE HCL 50 MG/ML IJ SOLN
INTRAMUSCULAR | Status: AC
  Filled 2014-04-12: qty 1

## 2014-04-12 MED ORDER — FAMOTIDINE IN NACL 20-0.9 MG/50ML-% IV SOLN
INTRAVENOUS | Status: AC
  Filled 2014-04-12: qty 50

## 2014-04-12 MED ORDER — FAMOTIDINE IN NACL 20-0.9 MG/50ML-% IV SOLN
20.0000 mg | Freq: Once | INTRAVENOUS | Status: AC
  Administered 2014-04-12: 20 mg via INTRAVENOUS

## 2014-04-12 MED ORDER — SODIUM CHLORIDE 0.9 % IV SOLN
Freq: Once | INTRAVENOUS | Status: AC
  Administered 2014-04-12: 10:00:00 via INTRAVENOUS

## 2014-04-12 MED ORDER — ONDANSETRON 8 MG/50ML IVPB (CHCC)
8.0000 mg | Freq: Once | INTRAVENOUS | Status: AC
  Administered 2014-04-12: 8 mg via INTRAVENOUS

## 2014-04-12 MED ORDER — DEXTROSE 5 % IV SOLN
80.0000 mg/m2 | Freq: Once | INTRAVENOUS | Status: AC
  Administered 2014-04-12: 168 mg via INTRAVENOUS
  Filled 2014-04-12: qty 28

## 2014-04-12 MED ORDER — DEXAMETHASONE SODIUM PHOSPHATE 20 MG/5ML IJ SOLN
20.0000 mg | Freq: Once | INTRAMUSCULAR | Status: AC
  Administered 2014-04-12: 20 mg via INTRAVENOUS

## 2014-04-12 MED ORDER — ONDANSETRON 8 MG/NS 50 ML IVPB
INTRAVENOUS | Status: AC
  Filled 2014-04-12: qty 8

## 2014-04-12 MED ORDER — DIPHENHYDRAMINE HCL 50 MG/ML IJ SOLN
25.0000 mg | Freq: Once | INTRAMUSCULAR | Status: AC
  Administered 2014-04-12: 25 mg via INTRAVENOUS

## 2014-04-12 MED ORDER — DIPHENHYDRAMINE HCL 50 MG/ML IJ SOLN: 50.0000 mg | Freq: Once | INTRAMUSCULAR | Status: DC

## 2014-04-12 MED ORDER — HEPARIN SOD (PORK) LOCK FLUSH 100 UNIT/ML IV SOLN
500.0000 [IU] | Freq: Once | INTRAVENOUS | Status: AC | PRN
  Administered 2014-04-12: 500 [IU]
  Filled 2014-04-12: qty 5

## 2014-04-12 MED ORDER — DEXAMETHASONE SODIUM PHOSPHATE 20 MG/5ML IJ SOLN
INTRAMUSCULAR | Status: AC
  Filled 2014-04-12: qty 5

## 2014-04-12 MED ORDER — SODIUM CHLORIDE 0.9 % IJ SOLN
10.0000 mL | INTRAMUSCULAR | Status: DC | PRN
  Administered 2014-04-12: 10 mL
  Filled 2014-04-12: qty 10

## 2014-04-12 NOTE — Progress Notes (Signed)
Pt states that she has had restless legs with prior treatments.  Dr. Marko Plume notified and order received to decrease dose of Benadryl to 25 mg. IV.

## 2014-04-12 NOTE — Patient Instructions (Signed)
[facility] Discharge Instructions for Patients Receiving Chemotherapy  Today you received the following chemotherapy agents:  Taxol  To help prevent nausea and vomiting after your treatment, we encourage you to take your nausea medication as ordered per MD.   If you develop nausea and vomiting that is not controlled by your nausea medication, call the clinic.   BELOW ARE SYMPTOMS THAT SHOULD BE REPORTED IMMEDIATELY:  *FEVER GREATER THAN 100.5 F  *CHILLS WITH OR WITHOUT FEVER  NAUSEA AND VOMITING THAT IS NOT CONTROLLED WITH YOUR NAUSEA MEDICATION  *UNUSUAL SHORTNESS OF BREATH  *UNUSUAL BRUISING OR BLEEDING  TENDERNESS IN MOUTH AND THROAT WITH OR WITHOUT PRESENCE OF ULCERS  *URINARY PROBLEMS  *BOWEL PROBLEMS  UNUSUAL RASH Items with * indicate a potential emergency and should be followed up as soon as possible.  Feel free to call the clinic you have any questions or concerns. The clinic phone number is [phone].    

## 2014-04-13 ENCOUNTER — Ambulatory Visit (HOSPITAL_BASED_OUTPATIENT_CLINIC_OR_DEPARTMENT_OTHER): Payer: 59

## 2014-04-13 ENCOUNTER — Other Ambulatory Visit: Payer: Self-pay | Admitting: Oncology

## 2014-04-13 DIAGNOSIS — T451X5A Adverse effect of antineoplastic and immunosuppressive drugs, initial encounter: Secondary | ICD-10-CM

## 2014-04-13 DIAGNOSIS — G620 Drug-induced polyneuropathy: Secondary | ICD-10-CM | POA: Insufficient documentation

## 2014-04-13 DIAGNOSIS — C50411 Malignant neoplasm of upper-outer quadrant of right female breast: Secondary | ICD-10-CM

## 2014-04-13 DIAGNOSIS — D701 Agranulocytosis secondary to cancer chemotherapy: Secondary | ICD-10-CM | POA: Insufficient documentation

## 2014-04-13 DIAGNOSIS — Z95828 Presence of other vascular implants and grafts: Secondary | ICD-10-CM | POA: Insufficient documentation

## 2014-04-13 DIAGNOSIS — Z5189 Encounter for other specified aftercare: Secondary | ICD-10-CM

## 2014-04-13 DIAGNOSIS — D6481 Anemia due to antineoplastic chemotherapy: Secondary | ICD-10-CM | POA: Insufficient documentation

## 2014-04-13 MED ORDER — TBO-FILGRASTIM 300 MCG/0.5ML ~~LOC~~ SOSY
300.0000 ug | PREFILLED_SYRINGE | Freq: Once | SUBCUTANEOUS | Status: AC
  Administered 2014-04-13: 300 ug via SUBCUTANEOUS
  Filled 2014-04-13: qty 0.5

## 2014-04-19 ENCOUNTER — Other Ambulatory Visit: Payer: 59

## 2014-04-19 ENCOUNTER — Ambulatory Visit: Payer: 59

## 2014-04-19 ENCOUNTER — Ambulatory Visit (HOSPITAL_BASED_OUTPATIENT_CLINIC_OR_DEPARTMENT_OTHER): Payer: 59

## 2014-04-19 ENCOUNTER — Other Ambulatory Visit (HOSPITAL_BASED_OUTPATIENT_CLINIC_OR_DEPARTMENT_OTHER): Payer: 59

## 2014-04-19 DIAGNOSIS — C50411 Malignant neoplasm of upper-outer quadrant of right female breast: Secondary | ICD-10-CM

## 2014-04-19 DIAGNOSIS — Z5111 Encounter for antineoplastic chemotherapy: Secondary | ICD-10-CM

## 2014-04-19 DIAGNOSIS — Z95828 Presence of other vascular implants and grafts: Secondary | ICD-10-CM

## 2014-04-19 LAB — CBC WITH DIFFERENTIAL/PLATELET
BASO%: 0 % (ref 0.0–2.0)
Basophils Absolute: 0 10*3/uL (ref 0.0–0.1)
EOS%: 0 % (ref 0.0–7.0)
Eosinophils Absolute: 0 10*3/uL (ref 0.0–0.5)
HCT: 28.1 % — ABNORMAL LOW (ref 34.8–46.6)
HEMOGLOBIN: 9 g/dL — AB (ref 11.6–15.9)
LYMPH%: 26.2 % (ref 14.0–49.7)
MCH: 31.1 pg (ref 25.1–34.0)
MCHC: 32 g/dL (ref 31.5–36.0)
MCV: 97.2 fL (ref 79.5–101.0)
MONO#: 0 10*3/uL — ABNORMAL LOW (ref 0.1–0.9)
MONO%: 0.7 % (ref 0.0–14.0)
NEUT%: 73.1 % (ref 38.4–76.8)
NEUTROS ABS: 2.1 10*3/uL (ref 1.5–6.5)
PLATELETS: 265 10*3/uL (ref 145–400)
RBC: 2.89 10*6/uL — ABNORMAL LOW (ref 3.70–5.45)
RDW: 18.8 % — ABNORMAL HIGH (ref 11.2–14.5)
WBC: 2.9 10*3/uL — AB (ref 3.9–10.3)
lymph#: 0.8 10*3/uL — ABNORMAL LOW (ref 0.9–3.3)

## 2014-04-19 LAB — COMPREHENSIVE METABOLIC PANEL (CC13)
ALK PHOS: 70 U/L (ref 40–150)
ALT: 29 U/L (ref 0–55)
AST: 17 U/L (ref 5–34)
Albumin: 3.7 g/dL (ref 3.5–5.0)
Anion Gap: 14 mEq/L — ABNORMAL HIGH (ref 3–11)
BILIRUBIN TOTAL: 0.39 mg/dL (ref 0.20–1.20)
BUN: 15.3 mg/dL (ref 7.0–26.0)
CO2: 20 mEq/L — ABNORMAL LOW (ref 22–29)
CREATININE: 0.9 mg/dL (ref 0.6–1.1)
Calcium: 9.4 mg/dL (ref 8.4–10.4)
Chloride: 106 mEq/L (ref 98–109)
EGFR: 88 mL/min/{1.73_m2} — AB (ref >90)
Glucose: 208 mg/dl — ABNORMAL HIGH (ref 70–140)
Potassium: 4.3 mEq/L (ref 3.5–5.1)
Sodium: 140 mEq/L (ref 136–145)
Total Protein: 6.6 g/dL (ref 6.4–8.3)

## 2014-04-19 MED ORDER — FAMOTIDINE IN NACL 20-0.9 MG/50ML-% IV SOLN
20.0000 mg | Freq: Once | INTRAVENOUS | Status: AC
  Administered 2014-04-19: 20 mg via INTRAVENOUS

## 2014-04-19 MED ORDER — ONDANSETRON 8 MG/50ML IVPB (CHCC)
8.0000 mg | Freq: Once | INTRAVENOUS | Status: AC
  Administered 2014-04-19: 8 mg via INTRAVENOUS

## 2014-04-19 MED ORDER — FAMOTIDINE IN NACL 20-0.9 MG/50ML-% IV SOLN
INTRAVENOUS | Status: AC
  Filled 2014-04-19: qty 50

## 2014-04-19 MED ORDER — SODIUM CHLORIDE 0.9 % IJ SOLN
10.0000 mL | INTRAMUSCULAR | Status: DC | PRN
  Administered 2014-04-19: 10 mL
  Filled 2014-04-19: qty 10

## 2014-04-19 MED ORDER — SODIUM CHLORIDE 0.9 % IJ SOLN
10.0000 mL | INTRAMUSCULAR | Status: DC | PRN
  Administered 2014-04-19: 10 mL via INTRAVENOUS
  Filled 2014-04-19: qty 10

## 2014-04-19 MED ORDER — DIPHENHYDRAMINE HCL 50 MG/ML IJ SOLN
25.0000 mg | Freq: Once | INTRAMUSCULAR | Status: AC
  Administered 2014-04-19: 25 mg via INTRAVENOUS

## 2014-04-19 MED ORDER — ONDANSETRON 8 MG/NS 50 ML IVPB
INTRAVENOUS | Status: AC
  Filled 2014-04-19: qty 8

## 2014-04-19 MED ORDER — HEPARIN SOD (PORK) LOCK FLUSH 100 UNIT/ML IV SOLN
500.0000 [IU] | Freq: Once | INTRAVENOUS | Status: AC | PRN
  Administered 2014-04-19: 500 [IU]
  Filled 2014-04-19: qty 5

## 2014-04-19 MED ORDER — SODIUM CHLORIDE 0.9 % IV SOLN
Freq: Once | INTRAVENOUS | Status: AC
  Administered 2014-04-19: 12:00:00 via INTRAVENOUS

## 2014-04-19 MED ORDER — PACLITAXEL CHEMO INJECTION 300 MG/50ML
80.0000 mg/m2 | Freq: Once | INTRAVENOUS | Status: AC
  Administered 2014-04-19: 168 mg via INTRAVENOUS
  Filled 2014-04-19: qty 28

## 2014-04-19 MED ORDER — DIPHENHYDRAMINE HCL 50 MG/ML IJ SOLN
INTRAMUSCULAR | Status: AC
Start: 2014-04-19 — End: 2014-04-19
  Filled 2014-04-19: qty 1

## 2014-04-19 MED ORDER — DEXAMETHASONE SODIUM PHOSPHATE 20 MG/5ML IJ SOLN
20.0000 mg | Freq: Once | INTRAMUSCULAR | Status: AC
  Administered 2014-04-19: 20 mg via INTRAVENOUS

## 2014-04-19 MED ORDER — DEXAMETHASONE SODIUM PHOSPHATE 20 MG/5ML IJ SOLN
INTRAMUSCULAR | Status: AC
  Filled 2014-04-19: qty 5

## 2014-04-19 NOTE — Patient Instructions (Signed)
[facility] Discharge Instructions for Patients Receiving Chemotherapy  Today you received the following chemotherapy agents:  Taxol  To help prevent nausea and vomiting after your treatment, we encourage you to take your nausea medication as ordered per MD.   If you develop nausea and vomiting that is not controlled by your nausea medication, call the clinic.   BELOW ARE SYMPTOMS THAT SHOULD BE REPORTED IMMEDIATELY:  *FEVER GREATER THAN 100.5 F  *CHILLS WITH OR WITHOUT FEVER  NAUSEA AND VOMITING THAT IS NOT CONTROLLED WITH YOUR NAUSEA MEDICATION  *UNUSUAL SHORTNESS OF BREATH  *UNUSUAL BRUISING OR BLEEDING  TENDERNESS IN MOUTH AND THROAT WITH OR WITHOUT PRESENCE OF ULCERS  *URINARY PROBLEMS  *BOWEL PROBLEMS  UNUSUAL RASH Items with * indicate a potential emergency and should be followed up as soon as possible.  Feel free to call the clinic you have any questions or concerns. The clinic phone number is [phone].    

## 2014-04-19 NOTE — Patient Instructions (Signed)

## 2014-04-20 ENCOUNTER — Other Ambulatory Visit: Payer: Self-pay | Admitting: Oncology

## 2014-04-20 ENCOUNTER — Ambulatory Visit (HOSPITAL_BASED_OUTPATIENT_CLINIC_OR_DEPARTMENT_OTHER): Payer: 59

## 2014-04-20 DIAGNOSIS — Z5189 Encounter for other specified aftercare: Secondary | ICD-10-CM

## 2014-04-20 DIAGNOSIS — C50411 Malignant neoplasm of upper-outer quadrant of right female breast: Secondary | ICD-10-CM

## 2014-04-20 MED ORDER — FILGRASTIM 300 MCG/0.5ML IJ SOSY: 300.0000 ug | PREFILLED_SYRINGE | Freq: Once | INTRAMUSCULAR | Status: DC

## 2014-04-20 MED ORDER — TBO-FILGRASTIM 300 MCG/0.5ML ~~LOC~~ SOSY
300.0000 ug | PREFILLED_SYRINGE | Freq: Once | SUBCUTANEOUS | Status: AC
  Administered 2014-04-20: 300 ug via SUBCUTANEOUS
  Filled 2014-04-20: qty 0.5

## 2014-04-21 ENCOUNTER — Ambulatory Visit (HOSPITAL_BASED_OUTPATIENT_CLINIC_OR_DEPARTMENT_OTHER): Payer: 59 | Admitting: Oncology

## 2014-04-21 VITALS — BP 119/64 | HR 79 | Temp 98.4°F | Resp 18 | Ht 67.5 in | Wt 205.5 lb

## 2014-04-21 DIAGNOSIS — G620 Drug-induced polyneuropathy: Secondary | ICD-10-CM

## 2014-04-21 DIAGNOSIS — Z95828 Presence of other vascular implants and grafts: Secondary | ICD-10-CM

## 2014-04-21 DIAGNOSIS — M62838 Other muscle spasm: Secondary | ICD-10-CM

## 2014-04-21 DIAGNOSIS — D701 Agranulocytosis secondary to cancer chemotherapy: Secondary | ICD-10-CM

## 2014-04-21 DIAGNOSIS — T451X5A Adverse effect of antineoplastic and immunosuppressive drugs, initial encounter: Secondary | ICD-10-CM

## 2014-04-21 DIAGNOSIS — C50411 Malignant neoplasm of upper-outer quadrant of right female breast: Secondary | ICD-10-CM

## 2014-04-21 DIAGNOSIS — Z1501 Genetic susceptibility to malignant neoplasm of breast: Secondary | ICD-10-CM

## 2014-04-21 DIAGNOSIS — Z853 Personal history of malignant neoplasm of breast: Secondary | ICD-10-CM

## 2014-04-21 DIAGNOSIS — Z1509 Genetic susceptibility to other malignant neoplasm: Secondary | ICD-10-CM

## 2014-04-21 DIAGNOSIS — D6481 Anemia due to antineoplastic chemotherapy: Secondary | ICD-10-CM

## 2014-04-22 ENCOUNTER — Encounter: Payer: Self-pay | Admitting: Oncology

## 2014-04-22 ENCOUNTER — Telehealth: Payer: Self-pay | Admitting: Oncology

## 2014-04-22 NOTE — Progress Notes (Signed)
OFFICE PROGRESS NOTE   April 21, 2014  Physicians:.F.Barry Dienes, D.Harlow Mares, K.Winifred Olive (GYN), S.Squire  INTERVAL HISTORY:  Patient is seen, together with husband, in ongoing follow up of adjuvant chemotherapy in process for triple negative left breast carcinoma, having had cycle 5 weekly taxol on 04-19-14. Counts are maintaining better with granix used day after each treatment. She has had no acute problems since last seen.  Patient is more fatigued today, which is her usual pattern on this weekly taxol, but has been able to work from home earlier this week. She has numbness in feet bilaterally so that she has to "walk carefully", and just in tips of fingers otherwise. She is sleeping well, has had no significant nausea, and no recent muscle spasms back or across chest. She is tolerating hemocyte daily.  PAC in Flu vaccine done Multiple BRCA 1 and 2 variants Left breast cancer 1996 treated with chemo including high dose with autologous BMT.   ONCOLOGIC HISTORY History is of left breast cancer in June 1996, when she was [redacted] weeks pregnant. This was 4x4x2.5 cm poorly differentiated invasive ductal and comedo carciinoma, ER PR negative, HER 2 not tested in 1996. She was treated with 2 cycles of adriamycin at 60 mg/m2 in [place] as she completed the pregnancy, then had left mastectomy with 12 of 23 left axillary nodes positive. She had additional adriamycin and cytoxan in [place] to total adriamycin dose of 300 mg/m2, followed by high dose chemotherapy with cytoxan, CDDP and autologous transplant on protocol at [place] (Dr J.Vredenburgh) , and local radiation on left. We have received treatment records from [facility] for high dose chemotherapy and autologous transplant done 1996, which I have reviewed and which will be scanned into this EMR. She had left TRAM reconstruction. She had multiple BRCA 1 and 2 variants at last genetics testing. She had hysterectomy with oophorectomy ~ 08-2009, but  was reluctant to proceed with prophylactic right mastectomy. She was followed with mammograms + yearly MRIs due to BRCA abnormalities. Daughter [name] is healthy, age 16. Right breast cancer was identified on tomo mammography at [facility] 09-24-13, with heterogeneously dense breast tissue and suspicious area upper outer quadrant. She had MRI 09-27-13 with 9x5x6 mm mass upper outer quadrant on right, otherwise unremarkable bilaterally. US biopsy at [facility] 10-04-13 [phone]) documented invasive mammary carcinoma ER PR and HER 2 negative and proliferation fraction 87%. After consultation with general surgery, plastic surgery, medical oncology and radiation oncology, she agreed to right mastectomy. She had staging CT CAP 10-13-13, unremarkable other than 5 mm low density lesion in dome of right hepatic lobe likely cyst and scarring left lung apex likely from prior RT.She had right mastectomy with sentinel node evaluation and immediate reconstruction with right latissimus myocutaneous flap + saline implant by Drs Barry Dienes and Harlow Mares on 11-16-13. [place] pathology [phone] from 11-16-13 had grade 3 invasive ductal carcinoma 0.6 cm with 3 sentinel axillary nodes + 1 nonsentinel node negative, closest margin 1 cm, no LVSI, repeat ER and PR testing both 0% and not sufficient tumor for repeat HER 2 (which had been negative on diagnostic biopsy 09-30-13 [phone]). She had first carboplatin + taxotere on 12-28-13, with neulasta day 2. She completed cycle 4 carbo taxotere on 03-01-14. First weekly taxol  03-22-14, planned x 12 weeks.    Review of systems as above, also: No fever or symptoms of infection. No SOB. No problems with PAC, tho it is very lateral on right chest and certain positions not comfortable.  Bowels ok. Appetite ok. No bleeding. No LE swelling. Remainder of 10 point Review of Systems negative.  Objective:  Vital signs in last 24 hours:  BP 119/64 mmHg  Pulse 79  Temp(Src) 98.4 F (36.9  C) (Oral)  Resp 18  Ht 5' 7.5" (1.715 m)  Wt 205 lb 8 oz (93.214 kg)  BMI 31.69 kg/m2  Alert, oriented and appropriate. Ambulatory without assistance, slowly, wearing clogs.  Alopecia  HEENT:PERRL, sclerae not icteric. Oral mucosa moist without lesions, posterior pharynx clear.  Neck supple. No JVD.  Lymphatics:no cervical,supraclavicular, axillary adenopathy Resp: clear to auscultation bilaterally and normal percussion bilaterally Cardio: regular rate and rhythm. No gallop. GI: soft, nontender, not distended, no mass or organomegaly. Normally active bowel sounds.  Musculoskeletal/ Extremities: UE and LE without pitting edema, cords, tenderness Neuro: peripheral neuropathy feet as noted. Otherwise nonfocal. Affect ok, appears tired. Skin without rash, ecchymosis, petechiae Portacath-without erythema or tenderness  Lab Results:  Results for orders placed or performed in visit on 04/19/14  CBC with Differential  Result Value Ref Range   WBC 2.9 (L) 3.9 - 10.3 10e3/uL   NEUT# 2.1 1.5 - 6.5 10e3/uL   HGB 9.0 (L) 11.6 - 15.9 g/dL   HCT 28.1 (L) 34.8 - 46.6 %   Platelets 265 145 - 400 10e3/uL   MCV 97.2 79.5 - 101.0 fL   MCH 31.1 25.1 - 34.0 pg   MCHC 32.0 31.5 - 36.0 g/dL   RBC 2.89 (L) 3.70 - 5.45 10e6/uL   RDW 18.8 (H) 11.2 - 14.5 %   lymph# 0.8 (L) 0.9 - 3.3 10e3/uL   MONO# 0.0 (L) 0.1 - 0.9 10e3/uL   Eosinophils Absolute 0.0 0.0 - 0.5 10e3/uL   Basophils Absolute 0.0 0.0 - 0.1 10e3/uL   NEUT% 73.1 38.4 - 76.8 %   LYMPH% 26.2 14.0 - 49.7 %   MONO% 0.7 0.0 - 14.0 %   EOS% 0.0 0.0 - 7.0 %   BASO% 0.0 0.0 - 2.0 %  Comprehensive metabolic panel (Cmet) - [facility]  Result Value Ref Range   Sodium 140 136 - 145 mEq/L   Potassium 4.3 3.5 - 5.1 mEq/L   Chloride 106 98 - 109 mEq/L   CO2 20 (L) 22 - 29 mEq/L   Glucose 208 (H) 70 - 140 mg/dl   BUN 15.3 7.0 - 26.0 mg/dL   Creatinine 0.9 0.6 - 1.1 mg/dL   Total Bilirubin 0.39 0.20 - 1.20 mg/dL   Alkaline Phosphatase 70 40 - 150 U/L    AST 17 5 - 34 U/L   ALT 29 0 - 55 U/L   Total Protein 6.6 6.4 - 8.3 g/dL   Albumin 3.7 3.5 - 5.0 g/dL   Calcium 9.4 8.4 - 10.4 mg/dL   Anion Gap 14 (H) 3 - 11 mEq/L   EGFR 88 (L) >90 ml/min/1.73 m2   CBC reviewed, including increase in hemoglobin since adding oral iron  Studies/Results:  No results found.  Medications: I have reviewed the patient's current medications. Continue hemocyte.  DISCUSSION: she is in agreement with proceeding with chemo as planned. NOTE may need to hold or DC taxol prior to 12 weeks depending on neuropathy.  Assessment/Plan:  1.Triple negative right breast cancer 0.6 cm, 4 nodes negative: post right mastectomy with axillary node evaluation and immediate reconstruction with latissimus flap 11-16-13. adjuvant chemo began with 4 cycles carbo taxotere 12-28-13 thru 03-01-14, with neulasta, then weekly taxol planned x12 begun 03-22-14. Granix day after each chemo beginning  04-13-14 2.left breast cancer 1996: 4 cm primary, 12 nodes involved, ER PR negative (HER 2 not tested in 1996), diagnosed when 7 months pregnant, treated with adriamycin prior to delivery, then [facility] to total adria dose 300 mg/m2, then high dose therapy with cytoxan, CDDP, carmustine and autologous BMT at [facility], then RT. No known recurrent disease. Left TRAM reconstruction.  3.multiple BRCA 1 and 2 variants 4. PAC by Dr Barry Dienes  5.zoster shortly after completion of treatment 1996: prophylaxis with acyclovir due to steroids with upcoming chemo 6.flu vaccine done 7.hysterectomy with oophorectomy 2011 8. Constipation resolved.Prn laxatives to keep bowels moving daily. 9.right chest pain seems to be muscle spasm: has flexeril if robaxin not tolerated. 10. Visual changes with flashing lights at peripheral vision intermittently: possible visual migraine, does not seem associated with zofran, to follow up with ophthalmology. 11.anemia related to chemo and multiple blood draws: continue po iron,  follow 12.peripheral neuropathy related to taxanes: discussion as above, continue weekly taxol for now   Chemo orders and granix confirmed. She knows to call if needed prior to scheduled appointments.    Gordy Levan, MD

## 2014-04-22 NOTE — Telephone Encounter (Signed)
per pof to add labs to 3/7 & 3/22-cld & left pt a message to adv of time & date

## 2014-04-26 ENCOUNTER — Ambulatory Visit: Payer: 59

## 2014-04-26 ENCOUNTER — Other Ambulatory Visit: Payer: Self-pay | Admitting: Oncology

## 2014-04-26 ENCOUNTER — Ambulatory Visit (HOSPITAL_BASED_OUTPATIENT_CLINIC_OR_DEPARTMENT_OTHER): Payer: 59

## 2014-04-26 ENCOUNTER — Other Ambulatory Visit (HOSPITAL_BASED_OUTPATIENT_CLINIC_OR_DEPARTMENT_OTHER): Payer: 59

## 2014-04-26 DIAGNOSIS — Z95828 Presence of other vascular implants and grafts: Secondary | ICD-10-CM

## 2014-04-26 DIAGNOSIS — C50411 Malignant neoplasm of upper-outer quadrant of right female breast: Secondary | ICD-10-CM

## 2014-04-26 DIAGNOSIS — Z5111 Encounter for antineoplastic chemotherapy: Secondary | ICD-10-CM

## 2014-04-26 LAB — CBC WITH DIFFERENTIAL/PLATELET
BASO%: 0.2 % (ref 0.0–2.0)
Basophils Absolute: 0 10e3/uL (ref 0.0–0.1)
EOS%: 0 % (ref 0.0–7.0)
Eosinophils Absolute: 0 10e3/uL (ref 0.0–0.5)
HCT: 29.2 % — ABNORMAL LOW (ref 34.8–46.6)
HGB: 9.3 g/dL — ABNORMAL LOW (ref 11.6–15.9)
LYMPH%: 15.3 % (ref 14.0–49.7)
MCH: 31.2 pg (ref 25.1–34.0)
MCHC: 31.8 g/dL (ref 31.5–36.0)
MCV: 98 fL (ref 79.5–101.0)
MONO#: 0.1 10e3/uL (ref 0.1–0.9)
MONO%: 1.2 % (ref 0.0–14.0)
NEUT#: 5.1 10e3/uL (ref 1.5–6.5)
NEUT%: 83.3 % — ABNORMAL HIGH (ref 38.4–76.8)
Platelets: 318 10e3/uL (ref 145–400)
RBC: 2.98 10e6/uL — ABNORMAL LOW (ref 3.70–5.45)
RDW: 18.6 % — ABNORMAL HIGH (ref 11.2–14.5)
WBC: 6.1 10e3/uL (ref 3.9–10.3)
lymph#: 0.9 10e3/uL (ref 0.9–3.3)

## 2014-04-26 LAB — COMPREHENSIVE METABOLIC PANEL (CC13)
ALT: 21 U/L (ref 0–55)
AST: 16 U/L (ref 5–34)
Albumin: 3.6 g/dL (ref 3.5–5.0)
Alkaline Phosphatase: 68 U/L (ref 40–150)
Anion Gap: 12 meq/L — ABNORMAL HIGH (ref 3–11)
BUN: 14.6 mg/dL (ref 7.0–26.0)
CO2: 22 meq/L (ref 22–29)
Calcium: 9.2 mg/dL (ref 8.4–10.4)
Chloride: 106 meq/L (ref 98–109)
Creatinine: 0.8 mg/dL (ref 0.6–1.1)
EGFR: >90 ml/min/1.73 m2
Glucose: 211 mg/dL — ABNORMAL HIGH (ref 70–140)
Potassium: 4 meq/L (ref 3.5–5.1)
Sodium: 140 meq/L (ref 136–145)
Total Bilirubin: 0.43 mg/dL (ref 0.20–1.20)
Total Protein: 6.5 g/dL (ref 6.4–8.3)

## 2014-04-26 MED ORDER — DIPHENHYDRAMINE HCL 50 MG/ML IJ SOLN
25.0000 mg | Freq: Once | INTRAMUSCULAR | Status: AC
  Administered 2014-04-26: 25 mg via INTRAVENOUS

## 2014-04-26 MED ORDER — DEXTROSE 5 % IV SOLN
80.0000 mg/m2 | Freq: Once | INTRAVENOUS | Status: AC
  Administered 2014-04-26: 168 mg via INTRAVENOUS
  Filled 2014-04-26: qty 28

## 2014-04-26 MED ORDER — DIPHENHYDRAMINE HCL 50 MG/ML IJ SOLN
INTRAMUSCULAR | Status: AC
  Filled 2014-04-26: qty 1

## 2014-04-26 MED ORDER — SODIUM CHLORIDE 0.9 % IJ SOLN
10.0000 mL | INTRAMUSCULAR | Status: DC | PRN
  Administered 2014-04-26: 10 mL via INTRAVENOUS
  Filled 2014-04-26: qty 10

## 2014-04-26 MED ORDER — SODIUM CHLORIDE 0.9 % IV SOLN
Freq: Once | INTRAVENOUS | Status: AC
  Administered 2014-04-26: 14:00:00 via INTRAVENOUS
  Filled 2014-04-26: qty 4

## 2014-04-26 MED ORDER — HEPARIN SOD (PORK) LOCK FLUSH 100 UNIT/ML IV SOLN
500.0000 [IU] | Freq: Once | INTRAVENOUS | Status: AC | PRN
  Administered 2014-04-26: 500 [IU]
  Filled 2014-04-26: qty 5

## 2014-04-26 MED ORDER — FAMOTIDINE IN NACL 20-0.9 MG/50ML-% IV SOLN
20.0000 mg | Freq: Once | INTRAVENOUS | Status: AC
  Administered 2014-04-26: 20 mg via INTRAVENOUS

## 2014-04-26 MED ORDER — FAMOTIDINE IN NACL 20-0.9 MG/50ML-% IV SOLN
INTRAVENOUS | Status: AC
  Filled 2014-04-26: qty 50

## 2014-04-26 MED ORDER — SODIUM CHLORIDE 0.9 % IV SOLN
Freq: Once | INTRAVENOUS | Status: AC
  Administered 2014-04-26: 14:00:00 via INTRAVENOUS

## 2014-04-26 MED ORDER — SODIUM CHLORIDE 0.9 % IJ SOLN
10.0000 mL | INTRAMUSCULAR | Status: DC | PRN
  Administered 2014-04-26: 10 mL
  Filled 2014-04-26: qty 10

## 2014-04-26 NOTE — Patient Instructions (Signed)
[facility] Discharge Instructions for Patients Receiving Chemotherapy  Today you received the following chemotherapy agents:  Taxol  To help prevent nausea and vomiting after your treatment, we encourage you to take your nausea medication as ordered per MD.   If you develop nausea and vomiting that is not controlled by your nausea medication, call the clinic.   BELOW ARE SYMPTOMS THAT SHOULD BE REPORTED IMMEDIATELY:  *FEVER GREATER THAN 100.5 F  *CHILLS WITH OR WITHOUT FEVER  NAUSEA AND VOMITING THAT IS NOT CONTROLLED WITH YOUR NAUSEA MEDICATION  *UNUSUAL SHORTNESS OF BREATH  *UNUSUAL BRUISING OR BLEEDING  TENDERNESS IN MOUTH AND THROAT WITH OR WITHOUT PRESENCE OF ULCERS  *URINARY PROBLEMS  *BOWEL PROBLEMS  UNUSUAL RASH Items with * indicate a potential emergency and should be followed up as soon as possible.  Feel free to call the clinic you have any questions or concerns. The clinic phone number is [phone].    

## 2014-04-26 NOTE — Patient Instructions (Signed)

## 2014-04-27 ENCOUNTER — Ambulatory Visit (HOSPITAL_BASED_OUTPATIENT_CLINIC_OR_DEPARTMENT_OTHER): Payer: 59

## 2014-04-27 DIAGNOSIS — C50411 Malignant neoplasm of upper-outer quadrant of right female breast: Secondary | ICD-10-CM

## 2014-04-27 DIAGNOSIS — Z5189 Encounter for other specified aftercare: Secondary | ICD-10-CM

## 2014-04-27 MED ORDER — TBO-FILGRASTIM 300 MCG/0.5ML ~~LOC~~ SOSY
300.0000 ug | PREFILLED_SYRINGE | Freq: Once | SUBCUTANEOUS | Status: AC
  Administered 2014-04-27: 300 ug via SUBCUTANEOUS
  Filled 2014-04-27: qty 0.5

## 2014-05-02 ENCOUNTER — Ambulatory Visit (HOSPITAL_BASED_OUTPATIENT_CLINIC_OR_DEPARTMENT_OTHER): Payer: 59 | Admitting: Oncology

## 2014-05-02 ENCOUNTER — Other Ambulatory Visit (HOSPITAL_BASED_OUTPATIENT_CLINIC_OR_DEPARTMENT_OTHER): Payer: 59

## 2014-05-02 ENCOUNTER — Ambulatory Visit (HOSPITAL_BASED_OUTPATIENT_CLINIC_OR_DEPARTMENT_OTHER): Payer: 59

## 2014-05-02 ENCOUNTER — Other Ambulatory Visit: Payer: 59

## 2014-05-02 ENCOUNTER — Encounter: Payer: Self-pay | Admitting: Oncology

## 2014-05-02 VITALS — BP 108/72 | HR 93 | Temp 98.5°F | Resp 18 | Ht 67.5 in | Wt 207.4 lb

## 2014-05-02 DIAGNOSIS — D6481 Anemia due to antineoplastic chemotherapy: Secondary | ICD-10-CM | POA: Diagnosis not present

## 2014-05-02 DIAGNOSIS — C50411 Malignant neoplasm of upper-outer quadrant of right female breast: Secondary | ICD-10-CM

## 2014-05-02 DIAGNOSIS — Z452 Encounter for adjustment and management of vascular access device: Secondary | ICD-10-CM

## 2014-05-02 DIAGNOSIS — Z95828 Presence of other vascular implants and grafts: Secondary | ICD-10-CM

## 2014-05-02 DIAGNOSIS — G620 Drug-induced polyneuropathy: Secondary | ICD-10-CM

## 2014-05-02 DIAGNOSIS — C50912 Malignant neoplasm of unspecified site of left female breast: Secondary | ICD-10-CM

## 2014-05-02 DIAGNOSIS — T451X5A Adverse effect of antineoplastic and immunosuppressive drugs, initial encounter: Secondary | ICD-10-CM

## 2014-05-02 LAB — COMPREHENSIVE METABOLIC PANEL (CC13)
ALT: 57 U/L — AB (ref 0–55)
ANION GAP: 11 meq/L (ref 3–11)
AST: 34 U/L (ref 5–34)
Albumin: 3.4 g/dL — ABNORMAL LOW (ref 3.5–5.0)
Alkaline Phosphatase: 69 U/L (ref 40–150)
BUN: 12.7 mg/dL (ref 7.0–26.0)
CALCIUM: 8.7 mg/dL (ref 8.4–10.4)
CO2: 23 meq/L (ref 22–29)
CREATININE: 0.8 mg/dL (ref 0.6–1.1)
Chloride: 105 mEq/L (ref 98–109)
EGFR: >90 mL/min/{1.73_m2} (ref >90)
Glucose: 145 mg/dl — ABNORMAL HIGH (ref 70–140)
POTASSIUM: 4.1 meq/L (ref 3.5–5.1)
SODIUM: 139 meq/L (ref 136–145)
TOTAL PROTEIN: 6.1 g/dL — AB (ref 6.4–8.3)
Total Bilirubin: 0.42 mg/dL (ref 0.20–1.20)

## 2014-05-02 LAB — CBC WITH DIFFERENTIAL/PLATELET
BASO%: 0.3 % (ref 0.0–2.0)
Basophils Absolute: 0 10*3/uL (ref 0.0–0.1)
EOS ABS: 0.1 10*3/uL (ref 0.0–0.5)
EOS%: 1.3 % (ref 0.0–7.0)
HEMATOCRIT: 28.1 % — AB (ref 34.8–46.6)
HGB: 8.9 g/dL — ABNORMAL LOW (ref 11.6–15.9)
LYMPH#: 1.8 10*3/uL (ref 0.9–3.3)
LYMPH%: 44.6 % (ref 14.0–49.7)
MCH: 31.4 pg (ref 25.1–34.0)
MCHC: 31.7 g/dL (ref 31.5–36.0)
MCV: 99.3 fL (ref 79.5–101.0)
MONO#: 0.3 10*3/uL (ref 0.1–0.9)
MONO%: 7.3 % (ref 0.0–14.0)
NEUT#: 1.9 10*3/uL (ref 1.5–6.5)
NEUT%: 46.5 % (ref 38.4–76.8)
PLATELETS: 270 10*3/uL (ref 145–400)
RBC: 2.83 10*6/uL — ABNORMAL LOW (ref 3.70–5.45)
RDW: 18.1 % — ABNORMAL HIGH (ref 11.2–14.5)
WBC: 4 10*3/uL (ref 3.9–10.3)

## 2014-05-02 MED ORDER — DEXAMETHASONE 4 MG PO TABS: ORAL_TABLET | ORAL | Status: DC

## 2014-05-02 MED ORDER — HEPARIN SOD (PORK) LOCK FLUSH 100 UNIT/ML IV SOLN
500.0000 [IU] | Freq: Once | INTRAVENOUS | Status: AC
  Administered 2014-05-02: 500 [IU] via INTRAVENOUS
  Filled 2014-05-02: qty 5

## 2014-05-02 MED ORDER — SODIUM CHLORIDE 0.9 % IJ SOLN
10.0000 mL | INTRAMUSCULAR | Status: DC | PRN
  Administered 2014-05-02: 10 mL via INTRAVENOUS
  Filled 2014-05-02: qty 10

## 2014-05-02 NOTE — Progress Notes (Signed)
OFFICE PROGRESS NOTE   May 02, 2014   Physicians:F.Barry Dienes, D.Harlow Mares, K.Winifred Olive (GYN), S.Squire  INTERVAL HISTORY:  Patient is seen, together with husband, as she continues adjuvant chemotherapy for triple negative right breast cancer, due cycle 7 weekly taxol on 05-03-14. Situation is complicated by prior chemo for left breast cancer in 1996, which may have more susceptible to peripheral neuropathy from present treatment.  Heidi Sexton now has persistent neuropathy symptoms in feet bilaterally, mostly toes and soles of feet, which is interfering somewhat with walking even in office. She has only slight tingling in fingertips. Fatigue is a little better, but she has to rest as she prepares meals. Intermittent discomfort from right latissimus reconstruction is worse when she is cold. Bowels are moving, no bleeding, no problems with PAC, no SOB at rest and no other respiratory symptoms.   PAC in Flu vaccine done Multiple BRCA 1 and 2 variants Left breast cancer 1996 treated with chemo including high dose with autologous BMT.   ONCOLOGIC HISTORY History is of left breast cancer in June 1996, when she was [redacted] weeks pregnant. This was 4x4x2.5 cm poorly differentiated invasive ductal and comedo carciinoma, ER PR negative, HER 2 not tested in 1996. She was treated with 2 cycles of adriamycin at 60 mg/m2 in [employer] as she completed the pregnancy, then had left mastectomy with 12 of 23 left axillary nodes positive. She had additional adriamycin and cytoxan in [place] to total adriamycin dose of 300 mg/m2, followed by high dose chemotherapy with cytoxan, CDDP and autologous transplant on protocol at [place] (Dr J.Vredenburgh) , and local radiation on left. We have received treatment records from [facility] for high dose chemotherapy and autologous transplant done 1996, which I have reviewed and which will be scanned into this EMR. She had left TRAM reconstruction. She had multiple BRCA 1 and 2  variants at last genetics testing. She had hysterectomy with oophorectomy ~ 08-2009, but was reluctant to proceed with prophylactic right mastectomy. She was followed with mammograms + yearly MRIs due to BRCA abnormalities. Daughter Heidi Sexton is healthy, age 45. Right breast cancer was identified on tomo mammography at [facility] 09-24-13, with heterogeneously dense breast tissue and suspicious area upper outer quadrant. She had MRI 09-27-13 with 9x5x6 mm mass upper outer quadrant on right, otherwise unremarkable bilaterally. US biopsy at [facility] 10-04-13 [phone]) documented invasive mammary carcinoma ER PR and HER 2 negative and proliferation fraction 87%. After consultation with general surgery, plastic surgery, medical oncology and radiation oncology, she agreed to right mastectomy. She had staging CT CAP 10-13-13, unremarkable other than 5 mm low density lesion in dome of right hepatic lobe likely cyst and scarring left lung apex likely from prior RT.She had right mastectomy with sentinel node evaluation and immediate reconstruction with right latissimus myocutaneous flap + saline implant by Drs Barry Dienes and Harlow Mares on 11-16-13. [place] pathology [phone] from 11-16-13 had grade 3 invasive ductal carcinoma 0.6 cm with 3 sentinel axillary nodes + 1 nonsentinel node negative, closest margin 1 cm, no LVSI, repeat ER and PR testing both 0% and not sufficient tumor for repeat HER 2 (which had been negative on diagnostic biopsy 09-30-13 [phone]). She had first carboplatin + taxotere on 12-28-13, with neulasta day 2. She completed cycle 4 carbo taxotere on 03-01-14. First weekly taxol 03-22-14, planned x 12 weeks.   Review of systems as above, also: No fever or symptoms of infection. No new or different pain. No LE swelling. Remainder of 10 point Review of  Systems negative.  Objective:  Vital signs in last 24 hours:  BP 108/72 mmHg  Pulse 93  Temp(Src) 98.5 F (36.9 C) (Oral)  Resp 18  Ht 5'  7.5" (1.715 m)  Wt 207 lb 6.4 oz (94.076 kg)  BMI 31.99 kg/m2  SpO2 100% Weight is up 2 lbs. Alert, oriented and appropriate. Ambulatory slowly, gait more wide based and she watches feet. Alopecia  HEENT:PERRL, sclerae not icteric. Oral mucosa moist without lesions, posterior pharynx clear.  Neck supple. No JVD.  Lymphatics:no cervical,supraclavicular, axillary adenopathy Resp: clear to auscultation bilaterally and normal percussion bilaterally Cardio: regular rate and rhythm. No gallop. GI: soft, nontender, not distended, no mass or organomegaly. Normally active bowel sounds. Musculoskeletal/ Extremities: without pitting edema, cords, tenderness Neuro:  peripheral neuropathy in feet as noted. Otherwise nonfocal. PSYCH appropriate mood and affect Skin without rash, ecchymosis, petechiae Bilateral breast reconstructions without evidence of local recurrence. Axillae not remarkable Portacath-without erythema or tenderness  Lab Results:  Results for orders placed or performed in visit on 05/02/14  CBC with Differential  Result Value Ref Range   WBC 4.0 3.9 - 10.3 10e3/uL   NEUT# 1.9 1.5 - 6.5 10e3/uL   HGB 8.9 (L) 11.6 - 15.9 g/dL   HCT 28.1 (L) 34.8 - 46.6 %   Platelets 270 145 - 400 10e3/uL   MCV 99.3 79.5 - 101.0 fL   MCH 31.4 25.1 - 34.0 pg   MCHC 31.7 31.5 - 36.0 g/dL   RBC 2.83 (L) 3.70 - 5.45 10e6/uL   RDW 18.1 (H) 11.2 - 14.5 %   lymph# 1.8 0.9 - 3.3 10e3/uL   MONO# 0.3 0.1 - 0.9 10e3/uL   Eosinophils Absolute 0.1 0.0 - 0.5 10e3/uL   Basophils Absolute 0.0 0.0 - 0.1 10e3/uL   NEUT% 46.5 38.4 - 76.8 %   LYMPH% 44.6 14.0 - 49.7 %   MONO% 7.3 0.0 - 14.0 %   EOS% 1.3 0.0 - 7.0 %   BASO% 0.3 0.0 - 2.0 %  Comprehensive metabolic panel (Cmet) - [facility]  Result Value Ref Range   Sodium 139 136 - 145 mEq/L   Potassium 4.1 3.5 - 5.1 mEq/L   Chloride 105 98 - 109 mEq/L   CO2 23 22 - 29 mEq/L   Glucose 145 (H) 70 - 140 mg/dl   BUN 12.7 7.0 - 26.0 mg/dL   Creatinine 0.8 0.6 -  1.1 mg/dL   Total Bilirubin 0.42 0.20 - 1.20 mg/dL   Alkaline Phosphatase 69 40 - 150 U/L   AST 34 5 - 34 U/L   ALT 57 (H) 0 - 55 U/L   Total Protein 6.1 (L) 6.4 - 8.3 g/dL   Albumin 3.4 (L) 3.5 - 5.0 g/dL   Calcium 8.7 8.4 - 10.4 mg/dL   Anion Gap 11 3 - 11 mEq/L   EGFR >90 >90 ml/min/1.73 m2     Studies/Results:  No results found.  Medications: I have reviewed the patient's current medications. Encouraged to continue oral iron. She is not on gabapentin, but would be reasonable to begin this as trial if neuropathy does not improve in short treatment break upcoming.  DISCUSSION: Neuropathy in feet is concerning. I have recommended that we hold treatment on 3-15. She will call us at least 3-21, and would also hold 3-22 taxol if she cannot tell any improvement with treatment break by then; if still problems by 3-21, would begin gabapentin 300 mg at hs to start (gabapentin not discussed with patient at  this visit). I will see her as scheduled on 3-28 prior to planned treatment on 3-29. I have explained again to patient and husband that taxane peripheral neuropathy sometimes improves, but not not improve or may not resolve entirely; even so, she is very aware of reason for receiving this chemotherapy.  Assessment/Plan:  1.Triple negative right breast cancer 0.6 cm, 4 nodes negative: post right mastectomy with axillary node evaluation and immediate reconstruction with latissimus flap 11-16-13. adjuvant chemo began with 4 cycles carbo taxotere 12-28-13 thru 03-01-14, with neulasta, then weekly taxol planned x12 begun 03-22-14. Granix day after each chemo beginning 04-13-14 2.left breast cancer 1996: 4 cm primary, 12 nodes involved, ER PR negative (HER 2 not tested in 1996), diagnosed when 7 months pregnant, treated with adriamycin prior to delivery, then [facility] to total adria dose 300 mg/m2, then high dose therapy with cytoxan, CDDP, carmustine and autologous BMT at [facility], then RT. No known recurrent  disease. Left TRAM reconstruction.  3.multiple BRCA 1 and 2 variants 4.peripheral neuropathy related to taxanes, most pronounced in feet and interfering with ambulation now. Will reassess after a week break, possibly additional week break and addition of gabapentin (see above). 5.zoster shortly after completion of treatment 1996: prophylaxis with acyclovir due to steroids with upcoming chemo 6.flu vaccine done 7.hysterectomy with oophorectomy 2011 8. Constipation resolved.Prn laxatives to keep bowels moving daily. 9.right chest pain seems to be muscle spasm: has flexeril if robaxin not tolerated. 10.PAC by Dr Barry Dienes 11.anemia related to chemo and multiple blood draws: continue po iron, follow   Patient and husband understand discussion and are in agreement with plans as above. Chemo and granix orders adjusted. Time spent 25 min including >50% counseling and coordination of care.    LIVESAY,LENNIS P, MD   05/02/2014, 2:51 PM

## 2014-05-02 NOTE — Patient Instructions (Signed)

## 2014-05-03 ENCOUNTER — Ambulatory Visit: Payer: 59

## 2014-05-04 ENCOUNTER — Ambulatory Visit: Payer: 59

## 2014-05-09 ENCOUNTER — Telehealth: Payer: Self-pay | Admitting: *Deleted

## 2014-05-09 NOTE — Telephone Encounter (Signed)
Patient called to say her neuropathy is improved, but she thinks she needs one more week before restarting chemo.  She is scheduled for taxol tomorrow. Please advise.

## 2014-05-09 NOTE — Telephone Encounter (Signed)
Spoke with Ms. Varone and told her that Dr. Marko Plume said that it would be fine to postpone her treatment a week.  Keep appointment for lab/visit/chemo  As scheduled for 05-16-14. Cancelled appts for chemo and injection 05-10-14 and 05-11-14.

## 2014-05-10 ENCOUNTER — Other Ambulatory Visit: Payer: 59

## 2014-05-10 ENCOUNTER — Ambulatory Visit: Payer: 59

## 2014-05-11 ENCOUNTER — Ambulatory Visit: Payer: 59

## 2014-05-15 ENCOUNTER — Other Ambulatory Visit: Payer: Self-pay | Admitting: Oncology

## 2014-05-16 ENCOUNTER — Ambulatory Visit (HOSPITAL_BASED_OUTPATIENT_CLINIC_OR_DEPARTMENT_OTHER): Payer: 59 | Admitting: Oncology

## 2014-05-16 ENCOUNTER — Encounter: Payer: Self-pay | Admitting: Oncology

## 2014-05-16 ENCOUNTER — Other Ambulatory Visit (HOSPITAL_BASED_OUTPATIENT_CLINIC_OR_DEPARTMENT_OTHER): Payer: 59

## 2014-05-16 ENCOUNTER — Telehealth: Payer: Self-pay | Admitting: Oncology

## 2014-05-16 ENCOUNTER — Other Ambulatory Visit: Payer: 59

## 2014-05-16 ENCOUNTER — Telehealth: Payer: Self-pay | Admitting: *Deleted

## 2014-05-16 ENCOUNTER — Ambulatory Visit (HOSPITAL_BASED_OUTPATIENT_CLINIC_OR_DEPARTMENT_OTHER): Payer: 59

## 2014-05-16 VITALS — BP 121/65 | HR 85 | Temp 98.2°F | Resp 18 | Ht 67.5 in | Wt 208.7 lb

## 2014-05-16 DIAGNOSIS — D72819 Decreased white blood cell count, unspecified: Secondary | ICD-10-CM

## 2014-05-16 DIAGNOSIS — Z853 Personal history of malignant neoplasm of breast: Secondary | ICD-10-CM

## 2014-05-16 DIAGNOSIS — D701 Agranulocytosis secondary to cancer chemotherapy: Secondary | ICD-10-CM

## 2014-05-16 DIAGNOSIS — G620 Drug-induced polyneuropathy: Secondary | ICD-10-CM

## 2014-05-16 DIAGNOSIS — G629 Polyneuropathy, unspecified: Secondary | ICD-10-CM

## 2014-05-16 DIAGNOSIS — D6481 Anemia due to antineoplastic chemotherapy: Secondary | ICD-10-CM | POA: Diagnosis not present

## 2014-05-16 DIAGNOSIS — T451X5A Adverse effect of antineoplastic and immunosuppressive drugs, initial encounter: Secondary | ICD-10-CM

## 2014-05-16 DIAGNOSIS — Z95828 Presence of other vascular implants and grafts: Secondary | ICD-10-CM

## 2014-05-16 DIAGNOSIS — M62838 Other muscle spasm: Secondary | ICD-10-CM

## 2014-05-16 DIAGNOSIS — Z8619 Personal history of other infectious and parasitic diseases: Secondary | ICD-10-CM

## 2014-05-16 DIAGNOSIS — C50411 Malignant neoplasm of upper-outer quadrant of right female breast: Secondary | ICD-10-CM

## 2014-05-16 DIAGNOSIS — Z9889 Other specified postprocedural states: Secondary | ICD-10-CM

## 2014-05-16 LAB — CBC WITH DIFFERENTIAL/PLATELET
BASO%: 0.9 % (ref 0.0–2.0)
BASOS ABS: 0 10*3/uL (ref 0.0–0.1)
EOS%: 2.7 % (ref 0.0–7.0)
Eosinophils Absolute: 0.1 10*3/uL (ref 0.0–0.5)
HEMATOCRIT: 29.5 % — AB (ref 34.8–46.6)
HEMOGLOBIN: 9.4 g/dL — AB (ref 11.6–15.9)
LYMPH%: 39.2 % (ref 14.0–49.7)
MCH: 31.4 pg (ref 25.1–34.0)
MCHC: 32 g/dL (ref 31.5–36.0)
MCV: 98.3 fL (ref 79.5–101.0)
MONO#: 0.5 10*3/uL (ref 0.1–0.9)
MONO%: 11.6 % (ref 0.0–14.0)
NEUT%: 45.6 % (ref 38.4–76.8)
NEUTROS ABS: 2.1 10*3/uL (ref 1.5–6.5)
Platelets: 342 10*3/uL (ref 145–400)
RBC: 3 10*6/uL — ABNORMAL LOW (ref 3.70–5.45)
RDW: 16.8 % — AB (ref 11.2–14.5)
WBC: 4.6 10*3/uL (ref 3.9–10.3)
lymph#: 1.8 10*3/uL (ref 0.9–3.3)

## 2014-05-16 LAB — COMPREHENSIVE METABOLIC PANEL (CC13)
ALT: 21 U/L (ref 0–55)
ANION GAP: 11 meq/L (ref 3–11)
AST: 17 U/L (ref 5–34)
Albumin: 3.5 g/dL (ref 3.5–5.0)
Alkaline Phosphatase: 63 U/L (ref 40–150)
BILIRUBIN TOTAL: 0.43 mg/dL (ref 0.20–1.20)
BUN: 14.2 mg/dL (ref 7.0–26.0)
CALCIUM: 9 mg/dL (ref 8.4–10.4)
CO2: 25 mEq/L (ref 22–29)
Chloride: 108 mEq/L (ref 98–109)
Creatinine: 0.7 mg/dL (ref 0.6–1.1)
EGFR: >90 mL/min/{1.73_m2} (ref >90)
Glucose: 101 mg/dl (ref 70–140)
Potassium: 4.2 mEq/L (ref 3.5–5.1)
Sodium: 143 mEq/L (ref 136–145)
Total Protein: 6.5 g/dL (ref 6.4–8.3)

## 2014-05-16 MED ORDER — GABAPENTIN 100 MG PO CAPS: 100.0000 mg | ORAL_CAPSULE | Freq: Every day | ORAL | Status: DC

## 2014-05-16 MED ORDER — SODIUM CHLORIDE 0.9 % IJ SOLN
10.0000 mL | INTRAMUSCULAR | Status: DC | PRN
  Administered 2014-05-16: 10 mL via INTRAVENOUS
  Filled 2014-05-16: qty 10

## 2014-05-16 MED ORDER — HEPARIN SOD (PORK) LOCK FLUSH 100 UNIT/ML IV SOLN
500.0000 [IU] | Freq: Once | INTRAVENOUS | Status: AC
  Administered 2014-05-16: 500 [IU] via INTRAVENOUS
  Filled 2014-05-16: qty 5

## 2014-05-16 NOTE — Telephone Encounter (Signed)
apppointments made and avs printed for patient,email to Heidi Sexton to add 4/5 and will check on dr Marko Plume appoint for 4/8  Heidi Sexton

## 2014-05-16 NOTE — Patient Instructions (Signed)

## 2014-05-16 NOTE — Telephone Encounter (Signed)
Per staff message and POF I have scheduled appts. Advised scheduler of appts. JMW  

## 2014-05-16 NOTE — Progress Notes (Signed)
OFFICE PROGRESS NOTE   May 16, 2014   Physicians:F.Barry Dienes, D.Harlow Mares, K.Winifred Olive (GYN), S.Squire  INTERVAL HISTORY:   Patient is seen, together with husband, in continuing attention to adjuvant chemotherapy in process for T1N0 triple negative right breast cancer. Weekly taxol has been held for past 2 weeks, since cycle 6 on 3-8--16, due to neuropathy in feet and hands.   Neuropathy is some better in hands and feet with this short break off of taxol. She has slight tingling in fingertips but is able to work on computer without difficulty now; feet are also improved, tho more numbness in feet than in hands. Gait is better. Taste has improved, appetite is good, and bowels are mostly moving regularly. She did not tolerate first attempt at driving due to soreness upper trunk since right breast reconstruction.   PAC in Flu vaccine done Multiple BRCA 1 and 2 variants Left breast cancer 1996 treated with chemo including high dose with autologous BMT.     ONCOLOGIC HISTORY History is of left breast cancer in June 1996, when she was [redacted] weeks pregnant. This was 4x4x2.5 cm poorly differentiated invasive ductal and comedo carciinoma, ER PR negative, HER 2 not tested in 1996. She was treated with 2 cycles of adriamycin at 60 mg/m2 in [place] as she completed the pregnancy, then had left mastectomy with 12 of 23 left axillary nodes positive. She had additional adriamycin and cytoxan in [place] to total adriamycin dose of 300 mg/m2, followed by high dose chemotherapy with cytoxan, CDDP and autologous transplant on protocol at [facility] (Dr J.Vredenburgh) , and local radiation on left. We have received treatment records from [facility] for high dose chemotherapy and autologous transplant done 1996, which I have reviewed and which will be scanned into this EMR. She had left TRAM reconstruction. She had multiple BRCA 1 and 2 variants at last genetics testing. She had hysterectomy with oophorectomy  ~ 08-2009, but was reluctant to proceed with prophylactic right mastectomy. She was followed with mammograms + yearly MRIs due to BRCA abnormalities. Daughter [name] is healthy, age 53. Right breast cancer was identified on tomo mammography at [facility] 09-24-13, with heterogeneously dense breast tissue and suspicious area upper outer quadrant. She had MRI 09-27-13 with 9x5x6 mm mass upper outer quadrant on right, otherwise unremarkable bilaterally. US biopsy at [facility] 10-04-13 [phone]) documented invasive mammary carcinoma ER PR and HER 2 negative and proliferation fraction 87%. After consultation with general surgery, plastic surgery, medical oncology and radiation oncology, she agreed to right mastectomy. She had staging CT CAP 10-13-13, unremarkable other than 5 mm low density lesion in dome of right hepatic lobe likely cyst and scarring left lung apex likely from prior RT.She had right mastectomy with sentinel node evaluation and immediate reconstruction with right latissimus myocutaneous flap + saline implant by Drs Barry Dienes and Harlow Mares on 11-16-13. [place] pathology [phone] from 11-16-13 had grade 3 invasive ductal carcinoma 0.6 cm with 3 sentinel axillary nodes + 1 nonsentinel node negative, closest margin 1 cm, no LVSI, repeat ER and PR testing both 0% and not sufficient tumor for repeat HER 2 (which had been negative on diagnostic biopsy 09-30-13 [id number]). She had first carboplatin + taxotere on 12-28-13, with neulasta day 2. She completed cycle 4 carbo taxotere on 03-01-14. First weekly taxol 03-22-14, planned x 12 weeks. Weeks 7 and 8 held due to progressive neuropathy hands and feet.  Review of systems as above, also: No fever or symptoms of infection. Still fatigues with much  activity, tho able to do some limited housecleaning this weekend. No SOB at rest. No LE swelling. No bleeding. No problems with PAC. Still brief light streaks at peripheral vision, has not made apt with  ophthalmology yet - discussed and again recommended that she do this. Remainder of 10 point Review of Systems negative.  Objective:  Vital signs in last 24 hours:  BP 121/65 mmHg  Pulse 85  Temp(Src) 98.2 F (36.8 C) (Oral)  Resp 18  Ht 5' 7.5" (1.715 m)  Wt 208 lb 11.2 oz (94.666 kg)  BMI 32.19 kg/m2 Weight up 1 lb Alert, oriented and appropriate. Ambulatory without assistance.  Alopecia  HEENT:PERRL, sclerae not icteric. Oral mucosa moist without lesions, posterior pharynx clear.  Neck supple. No JVD.  Lymphatics:no cervical,supraclavicular, axillary adenopathy Resp: clear to auscultation bilaterally and normal percussion bilaterally Cardio: regular rate and rhythm. No gallop. GI: soft, nontender, not distended, no mass or organomegaly. Some bowel sounds.  Musculoskeletal/ Extremities: without pitting edema, cords, tenderness Neuro:  peripheral neuropathy as noted, otherwise nonfocal. PSYCH appropriate mood and affect Skin without rash, ecchymosis, petechiae Breasts: bilateral reconstructions without evidence of local recurrence. Axillae benign. Portacath-without erythema or tenderness  Lab Results:  Results for orders placed or performed in visit on 05/16/14  CBC with Differential  Result Value Ref Range   WBC 4.6 3.9 - 10.3 10e3/uL   NEUT# 2.1 1.5 - 6.5 10e3/uL   HGB 9.4 (L) 11.6 - 15.9 g/dL   HCT 29.5 (L) 34.8 - 46.6 %   Platelets 342 145 - 400 10e3/uL   MCV 98.3 79.5 - 101.0 fL   MCH 31.4 25.1 - 34.0 pg   MCHC 32.0 31.5 - 36.0 g/dL   RBC 3.00 (L) 3.70 - 5.45 10e6/uL   RDW 16.8 (H) 11.2 - 14.5 %   lymph# 1.8 0.9 - 3.3 10e3/uL   MONO# 0.5 0.1 - 0.9 10e3/uL   Eosinophils Absolute 0.1 0.0 - 0.5 10e3/uL   Basophils Absolute 0.0 0.0 - 0.1 10e3/uL   NEUT% 45.6 38.4 - 76.8 %   LYMPH% 39.2 14.0 - 49.7 %   MONO% 11.6 0.0 - 14.0 %   EOS% 2.7 0.0 - 7.0 %   BASO% 0.9 0.0 - 2.0 %  Comprehensive metabolic panel (Cmet) - [facility]  Result Value Ref Range   Sodium 143 136 -  145 mEq/L   Potassium 4.2 3.5 - 5.1 mEq/L   Chloride 108 98 - 109 mEq/L   CO2 25 22 - 29 mEq/L   Glucose 101 70 - 140 mg/dl   BUN 14.2 7.0 - 26.0 mg/dL   Creatinine 0.7 0.6 - 1.1 mg/dL   Total Bilirubin 0.43 0.20 - 1.20 mg/dL   Alkaline Phosphatase 63 40 - 150 U/L   AST 17 5 - 34 U/L   ALT 21 0 - 55 U/L   Total Protein 6.5 6.4 - 8.3 g/dL   Albumin 3.5 3.5 - 5.0 g/dL   Calcium 9.0 8.4 - 10.4 mg/dL   Anion Gap 11 3 - 11 mEq/L   EGFR >90 >90 ml/min/1.73 m2     Studies/Results:  No results found.  Medications: I have reviewed the patient's current medications. Continuing hemocyte/ ferrous fumarate. Add gabapentin 100 mg at hs and can increase this progressively if helpful. Add B complex vitamin.  DISCUSSION: we are encouraged that the peripheral neuropathy is some better with short break off of weekly taxol. She is willing to try to continue this up to another 4 cycles if  possible, but understands that we will closely follow the neuropathy symptoms and may simply not be able to give much more. She will have treatment on 3-29, with granix on 3-30. She and husband understand that if it were not for the high risk of this breast cancer that we would not be this aggressive with treatment.  Assessment/Plan: 1.Triple negative right breast cancer 0.6 cm, 4 nodes negative: post right mastectomy with axillary node evaluation and immediate reconstruction with latissimus flap 11-16-13. Adjuvant chemo with 4 cycles carbo taxotere 12-28-13 thru 03-01-14, then weekly taxol planned x12 begun 03-22-14. Granix day after each chemo beginning 04-13-14. Cycles 7 & 8 held with peripheral neuropathy. Carefully try to continue taxol with treatment again 05-17-14 (cycle 9). 2.left breast cancer 1996: 4 cm primary, 12 nodes involved, ER PR negative (HER 2 not tested in 1996), diagnosed when 7 months pregnant, treated with adriamycin prior to delivery, then [facility] to total adria dose 300 mg/m2, then high dose therapy with  cytoxan, CDDP, carmustine and autologous BMT at [facility], then RT. No known recurrent disease. Left TRAM reconstruction.  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 Add gabapentin and B complex. May not be able to give much more of the weekly taxol, plan as above. 5.zoster shortly after completion of treatment 1996: prophylaxis with acyclovir due to steroids with upcoming chemo 6.flu vaccine done 7.hysterectomy with oophorectomy 2011 8. Constipation resolved.Prn laxatives to keep bowels moving daily. 9.right chest pain muscle spasm: has flexeril if robaxin not tolerated. 10.PAC by Dr Barry Dienes 11.anemia related to chemo and multiple blood draws: continue po iron, follow   I will see her 4-4, possible treatment with #10 taxol on 4-5. Chemo and granix orders confirmed. Time spent 25 min including >50% counseling and coordination of care.    LIVESAY,LENNIS P, MD   05/16/2014, 1:41 PM

## 2014-05-17 ENCOUNTER — Ambulatory Visit (HOSPITAL_BASED_OUTPATIENT_CLINIC_OR_DEPARTMENT_OTHER): Payer: 59

## 2014-05-17 DIAGNOSIS — Z5111 Encounter for antineoplastic chemotherapy: Secondary | ICD-10-CM | POA: Diagnosis not present

## 2014-05-17 DIAGNOSIS — C50411 Malignant neoplasm of upper-outer quadrant of right female breast: Secondary | ICD-10-CM

## 2014-05-17 MED ORDER — DIPHENHYDRAMINE HCL 50 MG/ML IJ SOLN
25.0000 mg | Freq: Once | INTRAMUSCULAR | Status: AC
  Administered 2014-05-17: 25 mg via INTRAVENOUS

## 2014-05-17 MED ORDER — FAMOTIDINE IN NACL 20-0.9 MG/50ML-% IV SOLN
20.0000 mg | Freq: Once | INTRAVENOUS | Status: AC
  Administered 2014-05-17: 20 mg via INTRAVENOUS

## 2014-05-17 MED ORDER — PACLITAXEL CHEMO INJECTION 300 MG/50ML
80.0000 mg/m2 | Freq: Once | INTRAVENOUS | Status: AC
  Administered 2014-05-17: 168 mg via INTRAVENOUS
  Filled 2014-05-17: qty 28

## 2014-05-17 MED ORDER — SODIUM CHLORIDE 0.9 % IV SOLN
Freq: Once | INTRAVENOUS | Status: AC
  Administered 2014-05-17: 15:00:00 via INTRAVENOUS
  Filled 2014-05-17: qty 4

## 2014-05-17 MED ORDER — FAMOTIDINE IN NACL 20-0.9 MG/50ML-% IV SOLN
INTRAVENOUS | Status: AC
  Filled 2014-05-17: qty 50

## 2014-05-17 MED ORDER — SODIUM CHLORIDE 0.9 % IJ SOLN
10.0000 mL | INTRAMUSCULAR | Status: DC | PRN
  Administered 2014-05-17: 10 mL
  Filled 2014-05-17: qty 10

## 2014-05-17 MED ORDER — SODIUM CHLORIDE 0.9 % IV SOLN
Freq: Once | INTRAVENOUS | Status: AC
  Administered 2014-05-17: 15:00:00 via INTRAVENOUS

## 2014-05-17 MED ORDER — HEPARIN SOD (PORK) LOCK FLUSH 100 UNIT/ML IV SOLN
500.0000 [IU] | Freq: Once | INTRAVENOUS | Status: AC | PRN
  Administered 2014-05-17: 500 [IU]
  Filled 2014-05-17: qty 5

## 2014-05-17 MED ORDER — DIPHENHYDRAMINE HCL 50 MG/ML IJ SOLN
INTRAMUSCULAR | Status: AC
  Filled 2014-05-17: qty 1

## 2014-05-17 NOTE — Patient Instructions (Signed)
[facility] Discharge Instructions for Patients Receiving Chemotherapy  Today you received the following chemotherapy agents: Taxol.  To help prevent nausea and vomiting after your treatment, we encourage you to take your nausea medication: Zofran 8 mg every 8 hours   If you develop nausea and vomiting that is not controlled by your nausea medication, call the clinic.   BELOW ARE SYMPTOMS THAT SHOULD BE REPORTED IMMEDIATELY:  *FEVER GREATER THAN 100.5 F  *CHILLS WITH OR WITHOUT FEVER  NAUSEA AND VOMITING THAT IS NOT CONTROLLED WITH YOUR NAUSEA MEDICATION  *UNUSUAL SHORTNESS OF BREATH  *UNUSUAL BRUISING OR BLEEDING  TENDERNESS IN MOUTH AND THROAT WITH OR WITHOUT PRESENCE OF ULCERS  *URINARY PROBLEMS  *BOWEL PROBLEMS  UNUSUAL RASH Items with * indicate a potential emergency and should be followed up as soon as possible.  Feel free to call the clinic you have any questions or concerns. The clinic phone number is [phone].  Please show the CHEMO ALERT CARD at check-in to the Emergency Department and triage nurse.   

## 2014-05-18 DIAGNOSIS — Z853 Personal history of malignant neoplasm of breast: Secondary | ICD-10-CM | POA: Insufficient documentation

## 2014-05-19 ENCOUNTER — Ambulatory Visit (HOSPITAL_BASED_OUTPATIENT_CLINIC_OR_DEPARTMENT_OTHER): Payer: 59

## 2014-05-19 DIAGNOSIS — C50411 Malignant neoplasm of upper-outer quadrant of right female breast: Secondary | ICD-10-CM | POA: Diagnosis not present

## 2014-05-19 DIAGNOSIS — Z5189 Encounter for other specified aftercare: Secondary | ICD-10-CM | POA: Diagnosis not present

## 2014-05-19 MED ORDER — TBO-FILGRASTIM 300 MCG/0.5ML ~~LOC~~ SOSY
300.0000 ug | PREFILLED_SYRINGE | Freq: Once | SUBCUTANEOUS | Status: AC
  Administered 2014-05-19: 300 ug via SUBCUTANEOUS
  Filled 2014-05-19: qty 0.5

## 2014-05-21 ENCOUNTER — Other Ambulatory Visit: Payer: Self-pay | Admitting: Oncology

## 2014-05-23 ENCOUNTER — Telehealth: Payer: Self-pay | Admitting: Oncology

## 2014-05-23 ENCOUNTER — Ambulatory Visit (HOSPITAL_BASED_OUTPATIENT_CLINIC_OR_DEPARTMENT_OTHER): Payer: 59 | Admitting: Oncology

## 2014-05-23 ENCOUNTER — Ambulatory Visit: Payer: 59

## 2014-05-23 ENCOUNTER — Other Ambulatory Visit (HOSPITAL_BASED_OUTPATIENT_CLINIC_OR_DEPARTMENT_OTHER): Payer: 59

## 2014-05-23 ENCOUNTER — Encounter: Payer: Self-pay | Admitting: Oncology

## 2014-05-23 VITALS — BP 117/75 | HR 85 | Temp 98.7°F | Resp 18 | Ht 67.5 in | Wt 206.1 lb

## 2014-05-23 DIAGNOSIS — G620 Drug-induced polyneuropathy: Secondary | ICD-10-CM

## 2014-05-23 DIAGNOSIS — Z95828 Presence of other vascular implants and grafts: Secondary | ICD-10-CM

## 2014-05-23 DIAGNOSIS — Z171 Estrogen receptor negative status [ER-]: Secondary | ICD-10-CM | POA: Diagnosis not present

## 2014-05-23 DIAGNOSIS — C50411 Malignant neoplasm of upper-outer quadrant of right female breast: Secondary | ICD-10-CM | POA: Diagnosis not present

## 2014-05-23 DIAGNOSIS — G622 Polyneuropathy due to other toxic agents: Secondary | ICD-10-CM | POA: Diagnosis not present

## 2014-05-23 DIAGNOSIS — T451X5A Adverse effect of antineoplastic and immunosuppressive drugs, initial encounter: Secondary | ICD-10-CM

## 2014-05-23 DIAGNOSIS — D6481 Anemia due to antineoplastic chemotherapy: Secondary | ICD-10-CM | POA: Diagnosis not present

## 2014-05-23 DIAGNOSIS — Z853 Personal history of malignant neoplasm of breast: Secondary | ICD-10-CM

## 2014-05-23 DIAGNOSIS — D701 Agranulocytosis secondary to cancer chemotherapy: Secondary | ICD-10-CM

## 2014-05-23 DIAGNOSIS — M62838 Other muscle spasm: Secondary | ICD-10-CM

## 2014-05-23 LAB — COMPREHENSIVE METABOLIC PANEL (CC13)
ALT: 28 U/L (ref 0–55)
ANION GAP: 10 meq/L (ref 3–11)
AST: 19 U/L (ref 5–34)
Albumin: 3.8 g/dL (ref 3.5–5.0)
Alkaline Phosphatase: 74 U/L (ref 40–150)
BILIRUBIN TOTAL: 0.4 mg/dL (ref 0.20–1.20)
BUN: 12.3 mg/dL (ref 7.0–26.0)
CALCIUM: 8.8 mg/dL (ref 8.4–10.4)
CHLORIDE: 105 meq/L (ref 98–109)
CO2: 25 mEq/L (ref 22–29)
CREATININE: 0.8 mg/dL (ref 0.6–1.1)
EGFR: >90 mL/min/{1.73_m2} (ref >90)
Glucose: 87 mg/dl (ref 70–140)
Potassium: 4.4 mEq/L (ref 3.5–5.1)
Sodium: 140 mEq/L (ref 136–145)
TOTAL PROTEIN: 6.7 g/dL (ref 6.4–8.3)

## 2014-05-23 LAB — CBC WITH DIFFERENTIAL/PLATELET
BASO%: 0.5 % (ref 0.0–2.0)
Basophils Absolute: 0 10*3/uL (ref 0.0–0.1)
EOS%: 3.3 % (ref 0.0–7.0)
Eosinophils Absolute: 0.2 10*3/uL (ref 0.0–0.5)
HCT: 31.7 % — ABNORMAL LOW (ref 34.8–46.6)
HEMOGLOBIN: 10 g/dL — AB (ref 11.6–15.9)
LYMPH%: 48.4 % (ref 14.0–49.7)
MCH: 30.6 pg (ref 25.1–34.0)
MCHC: 31.6 g/dL (ref 31.5–36.0)
MCV: 96.9 fL (ref 79.5–101.0)
MONO#: 0.4 10*3/uL (ref 0.1–0.9)
MONO%: 7.9 % (ref 0.0–14.0)
NEUT%: 39.9 % (ref 38.4–76.8)
NEUTROS ABS: 2.1 10*3/uL (ref 1.5–6.5)
Platelets: 322 10*3/uL (ref 145–400)
RBC: 3.27 10*6/uL — ABNORMAL LOW (ref 3.70–5.45)
RDW: 16.2 % — ABNORMAL HIGH (ref 11.2–14.5)
WBC: 5.2 10*3/uL (ref 3.9–10.3)
lymph#: 2.5 10*3/uL (ref 0.9–3.3)

## 2014-05-23 MED ORDER — HEPARIN SOD (PORK) LOCK FLUSH 100 UNIT/ML IV SOLN
500.0000 [IU] | Freq: Once | INTRAVENOUS | Status: AC
  Administered 2014-05-23: 500 [IU] via INTRAVENOUS
  Filled 2014-05-23: qty 5

## 2014-05-23 MED ORDER — SODIUM CHLORIDE 0.9 % IJ SOLN
10.0000 mL | INTRAMUSCULAR | Status: DC | PRN
  Administered 2014-05-23: 10 mL via INTRAVENOUS
  Filled 2014-05-23: qty 10

## 2014-05-23 NOTE — Telephone Encounter (Signed)
to sch pt appt-gave tp copy of sch

## 2014-05-23 NOTE — Patient Instructions (Signed)

## 2014-05-23 NOTE — Progress Notes (Signed)
OFFICE PROGRESS NOTE   May 23, 2014   Physicians:F.Barry Dienes, D.Harlow Mares, K.Winifred Olive (GYN), S.Squire  INTERVAL HISTORY:  Patient is seen, alone for visit, in continuing attention to adjuvant chemotherapy in process for T1N0 triple negative right breast cancer, this complicated by taxane related peripheral neuropathy. Despite some improvement in the peripheral neuropathy after holding taxol x 2 weeks, symptoms have increased again after taxol resumed on 05-17-14.  Since #7 taxol on 05-17-14, Heidi Sexton has had numbness in fingers and itching palms which are interfering with computer work; feet are not uncomfortable as long as they are completely still, but very bothersome neuropathy with any movement. She began neurontin 100 mg at hs and B complex vitamin last week.  She has had no nausea or vomiting and bowels are moving regularly. She slept poorly around treatment, but no difficulty sleeping last few nights. Back and chest discomfort related to surgery is unchanged.     PAC in Flu vaccine done Multiple BRCA 1 and 2 variants Left breast cancer 1996 treated with chemo including high dose with autologous BMT.     ONCOLOGIC HISTORY History is of left breast cancer in June 1996, when she was [redacted] weeks pregnant. This was 4x4x2.5 cm poorly differentiated invasive ductal and comedo carciinoma, ER PR negative, HER 2 not tested in 1996. She was treated with 2 cycles of adriamycin at 60 mg/m2 in [place] as she completed the pregnancy, then had left mastectomy with 12 of 23 left axillary nodes positive. She had additional adriamycin and cytoxan in [place] to total adriamycin dose of 300 mg/m2, followed by high dose chemotherapy with cytoxan, CDDP and autologous transplant on protocol at [place] (Dr J.Vredenburgh) , and local radiation on left. We have received treatment records from [facility] for high dose chemotherapy and autologous transplant done 1996, which I have reviewed and which will be  scanned into this EMR. She had left TRAM reconstruction. She had multiple BRCA 1 and 2 variants at last genetics testing. She had hysterectomy with oophorectomy ~ 08-2009, but was reluctant to proceed with prophylactic right mastectomy. She was followed with mammograms + yearly MRIs due to BRCA abnormalities. Daughter [name] is healthy, age 42. Right breast cancer was identified on tomo mammography at [facility] 09-24-13, with heterogeneously dense breast tissue and suspicious area upper outer quadrant. She had MRI 09-27-13 with 9x5x6 mm mass upper outer quadrant on right, otherwise unremarkable bilaterally. US biopsy at [facility] 10-04-13 [phone]) documented invasive mammary carcinoma ER PR and HER 2 negative and proliferation fraction 87%. After consultation with general surgery, plastic surgery, medical oncology and radiation oncology, she agreed to right mastectomy. She had staging CT CAP 10-13-13, unremarkable other than 5 mm low density lesion in dome of right hepatic lobe likely cyst and scarring left lung apex likely from prior RT.She had right mastectomy with sentinel node evaluation and immediate reconstruction with right latissimus myocutaneous flap + saline implant by Drs Barry Dienes and Harlow Mares on 11-16-13. [place] pathology [phone] from 11-16-13 had grade 3 invasive ductal carcinoma 0.6 cm with 3 sentinel axillary nodes + 1 nonsentinel node negative, closest margin 1 cm, no LVSI, repeat ER and PR testing both 0% and not sufficient tumor for repeat HER 2 (which had been negative on diagnostic biopsy 09-30-13 [phone]). She had first carboplatin + taxotere on 12-28-13, with neulasta day 2. She completed cycle 4 carbo taxotere on 03-01-14. First weekly taxol 03-22-14, planned x 12 weeks. Weeks 7 and 8 held due to progressive neuropathy hands and  feet, with some improvement following that break. Taxol resumed with week 9 on 05-17-14, with worsening of peripheral neuropathy.    Review of systems  as above, also: No fever or symptoms of infection. No problems with PAC. No SOB or other respiratory symptoms. No other neurologic symptoms.Remainder of 10 point Review of Systems negative.  Objective:  Vital signs in last 24 hours:  BP 117/75 mmHg  Pulse 85  Temp(Src) 98.7 F (37.1 C) (Oral)  Resp 18  Ht 5' 7.5" (1.715 m)  Wt 206 lb 1.6 oz (93.486 kg)  BMI 31.78 kg/m2  SpO2 100% Weight is down  2 lbs Alert, oriented and appropriate. Ambulatory without assistance.  Alopecia  HEENT:PERRL, sclerae not icteric. Oral mucosa moist without lesions, posterior pharynx clear.  Neck supple. No JVD.  Lymphatics:no cervical,supraclavicular, axillary adenopathy Resp: clear to auscultation bilaterally and normal percussion bilaterally Cardio: regular rate and rhythm. No gallop. GI: soft, nontender, not distended, no mass or organomegaly. Normally active bowel sounds.  Musculoskeletal/ Extremities: without pitting edema, cords, tenderness Neuro: peripheral neuropathy as noted. Otherwise nonfocal Skin without rash, ecchymosis, petechiae Breasts: bilateral reconstructions healed.  Axillae benign. Portacath-without erythema or tenderness  Lab Results:  Results for orders placed or performed in visit on 05/23/14  CBC with Differential  Result Value Ref Range   WBC 5.2 3.9 - 10.3 10e3/uL   NEUT# 2.1 1.5 - 6.5 10e3/uL   HGB 10.0 (L) 11.6 - 15.9 g/dL   HCT 31.7 (L) 34.8 - 46.6 %   Platelets 322 145 - 400 10e3/uL   MCV 96.9 79.5 - 101.0 fL   MCH 30.6 25.1 - 34.0 pg   MCHC 31.6 31.5 - 36.0 g/dL   RBC 3.27 (L) 3.70 - 5.45 10e6/uL   RDW 16.2 (H) 11.2 - 14.5 %   lymph# 2.5 0.9 - 3.3 10e3/uL   MONO# 0.4 0.1 - 0.9 10e3/uL   Eosinophils Absolute 0.2 0.0 - 0.5 10e3/uL   Basophils Absolute 0.0 0.0 - 0.1 10e3/uL   NEUT% 39.9 38.4 - 76.8 %   LYMPH% 48.4 14.0 - 49.7 %   MONO% 7.9 0.0 - 14.0 %   EOS% 3.3 0.0 - 7.0 %   BASO% 0.5 0.0 - 2.0 %  Comprehensive metabolic panel (Cmet) - [facility]  Result  Value Ref Range   Sodium 140 136 - 145 mEq/L   Potassium 4.4 3.5 - 5.1 mEq/L   Chloride 105 98 - 109 mEq/L   CO2 25 22 - 29 mEq/L   Glucose 87 70 - 140 mg/dl   BUN 12.3 7.0 - 26.0 mg/dL   Creatinine 0.8 0.6 - 1.1 mg/dL   Total Bilirubin 0.40 0.20 - 1.20 mg/dL   Alkaline Phosphatase 74 40 - 150 U/L   AST 19 5 - 34 U/L   ALT 28 0 - 55 U/L   Total Protein 6.7 6.4 - 8.3 g/dL   Albumin 3.8 3.5 - 5.0 g/dL   Calcium 8.8 8.4 - 10.4 mg/dL   Anion Gap 10 3 - 11 mEq/L   EGFR >90 >90 ml/min/1.73 m2     Studies/Results:  No results found.  Medications: I have reviewed the patient's current medications. May want to increase neurontin to tid, continue B complex.  DISCUSSION: will hold next 2 weeks taxol, consider one additional treatment depending on symptoms after this break, but likely have reached maximum tolerable dose for the taxol and will go to observation for the stage I triple negative breast cancer if so.   Assessment/Plan:  1.Triple negative right breast cancer 0.6 cm, 4 nodes negative: post right mastectomy with axillary node evaluation and immediate reconstruction with latissimus flap 11-16-13. Adjuvant chemo with 4 cycles carbo taxotere 12-28-13 thru 03-01-14, then weekly taxol planned x12 begun 03-22-14. Granix day after each chemo beginning 04-13-14. Cycles 7 & 8 held with peripheral neuropathy, symptoms again worse after one additional dose of taxol on 05-17-14. Plan as above. 2.left breast cancer 1996: 4 cm primary, 12 nodes involved, ER PR negative (HER 2 not tested in 1996), diagnosed when 7 months pregnant, treated with adriamycin prior to delivery, then [facility] to total adria dose 300 mg/m2, then high dose therapy with cytoxan, CDDP, carmustine and autologous BMT at [facility], then RT. No known recurrent disease. Left TRAM reconstruction.  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 Continue gabapentin and B complex.  May not be able to give more of the weekly taxol, plan as above. 5.zoster shortly after completion of treatment 1996: prophylaxis with acyclovir due to steroids with upcoming chemo 6.flu vaccine done 7.hysterectomy with oophorectomy 2011 8. Constipation resolved.Prn laxatives to keep bowels moving daily. 9.right chest pain muscle spasm: has flexeril if robaxin not tolerated. 10.PAC by Dr Barry Dienes 11.anemia related to chemo and multiple blood draws: some better with po iron, follow   All questions addressed, chemo orders changed. Patient knows to call prior to next appointment if needed. Time spent 25 min including >50% counseling and coordination of care.  Draydon Clairmont P, MD   05/23/2014, 4:53 PM

## 2014-05-24 ENCOUNTER — Ambulatory Visit: Payer: 59

## 2014-05-25 ENCOUNTER — Ambulatory Visit: Payer: 59

## 2014-05-27 ENCOUNTER — Ambulatory Visit: Payer: 59 | Admitting: Oncology

## 2014-05-31 ENCOUNTER — Ambulatory Visit: Payer: 59

## 2014-05-31 ENCOUNTER — Other Ambulatory Visit: Payer: 59

## 2014-06-01 ENCOUNTER — Ambulatory Visit: Payer: 59

## 2014-06-03 ENCOUNTER — Telehealth: Payer: Self-pay

## 2014-06-03 NOTE — Telephone Encounter (Signed)
Faxed signed order dated 06-02-14  to  [employer] for mastectomy supplies.  Sent a copy to HIM to be scanned into patient's EMR.

## 2014-06-05 ENCOUNTER — Other Ambulatory Visit: Payer: Self-pay | Admitting: Oncology

## 2014-06-06 ENCOUNTER — Encounter: Payer: Self-pay | Admitting: Oncology

## 2014-06-06 ENCOUNTER — Telehealth: Payer: Self-pay | Admitting: Oncology

## 2014-06-06 ENCOUNTER — Other Ambulatory Visit: Payer: 59

## 2014-06-06 ENCOUNTER — Ambulatory Visit (HOSPITAL_BASED_OUTPATIENT_CLINIC_OR_DEPARTMENT_OTHER): Payer: 59 | Admitting: Oncology

## 2014-06-06 ENCOUNTER — Ambulatory Visit: Payer: 59

## 2014-06-06 ENCOUNTER — Other Ambulatory Visit (HOSPITAL_BASED_OUTPATIENT_CLINIC_OR_DEPARTMENT_OTHER): Payer: 59

## 2014-06-06 VITALS — BP 116/65 | HR 70 | Temp 98.8°F | Resp 18 | Ht 67.5 in | Wt 207.7 lb

## 2014-06-06 DIAGNOSIS — G620 Drug-induced polyneuropathy: Secondary | ICD-10-CM

## 2014-06-06 DIAGNOSIS — G622 Polyneuropathy due to other toxic agents: Secondary | ICD-10-CM

## 2014-06-06 DIAGNOSIS — C50911 Malignant neoplasm of unspecified site of right female breast: Secondary | ICD-10-CM

## 2014-06-06 DIAGNOSIS — Z95828 Presence of other vascular implants and grafts: Secondary | ICD-10-CM

## 2014-06-06 DIAGNOSIS — C50411 Malignant neoplasm of upper-outer quadrant of right female breast: Secondary | ICD-10-CM

## 2014-06-06 DIAGNOSIS — Z853 Personal history of malignant neoplasm of breast: Secondary | ICD-10-CM

## 2014-06-06 DIAGNOSIS — Z1501 Genetic susceptibility to malignant neoplasm of breast: Secondary | ICD-10-CM

## 2014-06-06 DIAGNOSIS — Z171 Estrogen receptor negative status [ER-]: Secondary | ICD-10-CM | POA: Diagnosis not present

## 2014-06-06 DIAGNOSIS — D701 Agranulocytosis secondary to cancer chemotherapy: Secondary | ICD-10-CM

## 2014-06-06 DIAGNOSIS — D6481 Anemia due to antineoplastic chemotherapy: Secondary | ICD-10-CM | POA: Diagnosis not present

## 2014-06-06 DIAGNOSIS — T451X5A Adverse effect of antineoplastic and immunosuppressive drugs, initial encounter: Secondary | ICD-10-CM

## 2014-06-06 LAB — CBC WITH DIFFERENTIAL/PLATELET
BASO%: 0.4 % (ref 0.0–2.0)
Basophils Absolute: 0 10*3/uL (ref 0.0–0.1)
EOS%: 2.6 % (ref 0.0–7.0)
Eosinophils Absolute: 0.1 10*3/uL (ref 0.0–0.5)
HCT: 32.8 % — ABNORMAL LOW (ref 34.8–46.6)
HGB: 10.3 g/dL — ABNORMAL LOW (ref 11.6–15.9)
LYMPH#: 2 10*3/uL (ref 0.9–3.3)
LYMPH%: 45.6 % (ref 14.0–49.7)
MCH: 30.3 pg (ref 25.1–34.0)
MCHC: 31.6 g/dL (ref 31.5–36.0)
MCV: 95.9 fL (ref 79.5–101.0)
MONO#: 0.5 10*3/uL (ref 0.1–0.9)
MONO%: 12.1 % (ref 0.0–14.0)
NEUT#: 1.7 10*3/uL (ref 1.5–6.5)
NEUT%: 39.3 % (ref 38.4–76.8)
Platelets: 294 10*3/uL (ref 145–400)
RBC: 3.42 10*6/uL — AB (ref 3.70–5.45)
RDW: 15.4 % — ABNORMAL HIGH (ref 11.2–14.5)
WBC: 4.4 10*3/uL (ref 3.9–10.3)

## 2014-06-06 LAB — COMPREHENSIVE METABOLIC PANEL (CC13)
ALK PHOS: 71 U/L (ref 40–150)
ALT: 25 U/L (ref 0–55)
AST: 23 U/L (ref 5–34)
Albumin: 3.9 g/dL (ref 3.5–5.0)
Anion Gap: 13 mEq/L — ABNORMAL HIGH (ref 3–11)
BILIRUBIN TOTAL: 0.43 mg/dL (ref 0.20–1.20)
BUN: 12.6 mg/dL (ref 7.0–26.0)
CO2: 21 mEq/L — ABNORMAL LOW (ref 22–29)
Calcium: 9.3 mg/dL (ref 8.4–10.4)
Chloride: 107 mEq/L (ref 98–109)
Creatinine: 0.7 mg/dL (ref 0.6–1.1)
Glucose: 92 mg/dl (ref 70–140)
Potassium: 4 mEq/L (ref 3.5–5.1)
Sodium: 141 mEq/L (ref 136–145)
TOTAL PROTEIN: 7 g/dL (ref 6.4–8.3)

## 2014-06-06 MED ORDER — SODIUM CHLORIDE 0.9 % IJ SOLN
10.0000 mL | INTRAMUSCULAR | Status: DC | PRN
  Administered 2014-06-06: 10 mL via INTRAVENOUS
  Filled 2014-06-06: qty 10

## 2014-06-06 MED ORDER — HEPARIN SOD (PORK) LOCK FLUSH 100 UNIT/ML IV SOLN
500.0000 [IU] | Freq: Once | INTRAVENOUS | Status: AC
  Administered 2014-06-06: 500 [IU] via INTRAVENOUS
  Filled 2014-06-06: qty 5

## 2014-06-06 NOTE — Progress Notes (Signed)
OFFICE PROGRESS NOTE   June 06, 2014   Physicians:F.Barry Dienes, D.Harlow Mares, K.Winifred Olive (GYN), S.Squire, M.Johnson  INTERVAL HISTORY:  Patient is seen, together with husband, in continuing attention to T1N0 triple negative right breast cancer, adjuvant chemotherapy recently complicated by significant peripheral neuropathy. She has now had total of 7 treatments of weekly taxol out of planned 12 weeks treatment; last taxol was given 05-17-14.  Heidi Sexton has noticed improvement in neuropathy in hands in 2 weeks since last taxol, tho still some difficulty at times with computer keyboard. Feet are more comfortable at night with gabapentin 100 mg at hs, however she still has some difficulty with gait and the neuropathy is continuous in feet. She is otherwise not quite as fatigued and taste is improving.  She saw Dr Harlow Mares for follow up recently, discussed other surgical options to improve symmetry of reconstructed breasts, which she does not want to pursue now.    PAC in Flu vaccine done Multiple BRCA 1 and 2 variants Left breast cancer 1996 treated with chemo including high dose with autologous BMT.     ONCOLOGIC HISTORY History is of left breast cancer in June 1996, when she was [redacted] weeks pregnant. This was 4x4x2.5 cm poorly differentiated invasive ductal and comedo carciinoma, ER PR negative, HER 2 not tested in 1996. She was treated with 2 cycles of adriamycin at 60 mg/m2 in [place] as she completed the pregnancy, then had left mastectomy with 12 of 23 left axillary nodes positive. She had additional adriamycin and cytoxan in [place] to total adriamycin dose of 300 mg/m2, followed by high dose chemotherapy with cytoxan, CDDP and autologous transplant on protocol at [place] (Dr J.Vredenburgh) , and local radiation on left. We have received treatment records from [facility] for high dose chemotherapy and autologous transplant done 1996, which I have reviewed and which will be scanned into  this EMR. She had left TRAM reconstruction. She had multiple BRCA 1 and 2 variants at last genetics testing. She had hysterectomy with oophorectomy ~ 08-2009, but was reluctant to proceed with prophylactic right mastectomy. She was followed with mammograms + yearly MRIs due to BRCA abnormalities. Daughter Heidi Sexton is healthy, age 78.  Right breast cancer was identified on tomo mammography at [facility] 09-24-13, with heterogeneously dense breast tissue and suspicious area upper outer quadrant. She had MRI 09-27-13 with 9x5x6 mm mass upper outer quadrant on right, otherwise unremarkable bilaterally. US biopsy at [facility] 10-04-13 [phone]) documented invasive mammary carcinoma ER PR and HER 2 negative and proliferation fraction 87%. After consultation with general surgery, plastic surgery, medical oncology and radiation oncology, she agreed to right mastectomy. She had staging CT CAP 10-13-13, unremarkable other than 5 mm low density lesion in dome of right hepatic lobe likely cyst and scarring left lung apex likely from prior RT.She had right mastectomy with sentinel node evaluation and immediate reconstruction with right latissimus myocutaneous flap + saline implant by Drs Barry Dienes and Harlow Mares on 11-16-13. [facility] [phone] from 11-16-13 had grade 3 invasive ductal carcinoma 0.6 cm with 3 sentinel axillary nodes + 1 nonsentinel node negative, closest margin 1 cm, no LVSI, repeat ER and PR testing both 0% and not sufficient tumor for repeat HER 2 (which had been negative on diagnostic biopsy 09-30-13 [phone]). She had first carboplatin + taxotere on 12-28-13, with neulasta day 2. She completed cycle 4 carbo taxotere on 03-01-14. First weekly taxol 03-22-14, planned x 12 weeks. Weeks 7 and 8 held due to progressive neuropathy hands and  feet, with some improvement following that break. Taxol resumed on 05-17-14, with worsening of peripheral neuropathy. Total of 7 taxol treatments given.       Review of systems as above, also: No problems with PAC. No bleeding. No fever. Bowels ok. No significant nausea Remainder of 10 point Review of Systems negative.   She and Heidi Sexton will go to [facility] July 9-13  Objective:  Vital signs in last 24 hours:  BP 116/65 mmHg  Pulse 70  Temp(Src) 98.8 F (37.1 C) (Oral)  Resp 18  Ht 5' 7.5" (1.715 m)  Wt 207 lb 11.2 oz (94.212 kg)  BMI 32.03 kg/m2  SpO2 100% Weight up 1 lb Alert, oriented and appropriate. Ambulatory with some difficulty.  Alopecia  HEENT:PERRL, sclerae not icteric. Oral mucosa moist without lesions, posterior pharynx clear.  Neck supple. No JVD.  Lymphatics:no cervical,supraclavicular, axillary adenopathy Resp: clear to auscultation bilaterally and normal percussion bilaterally Cardio: regular rate and rhythm. No gallop. GI: soft, nontender, not distended, no mass or organomegaly. Normally active bowel sounds.  Musculoskeletal/ Extremities: without pitting edema, cords, tenderness Neuro:  peripheral neuropathy fingers and feet as noted. Otherwise nonfocal. PSYCH appropriate mood and affect Skin without rash, ecchymosis, petechiae Breasts: bilateral reconstructions without dominant mass, skin or nipple findings. Axillae benign. Portacath-without erythema or tenderness  Lab Results:  Results for orders placed or performed in visit on 06/06/14  CBC with Differential  Result Value Ref Range   WBC 4.4 3.9 - 10.3 10e3/uL   NEUT# 1.7 1.5 - 6.5 10e3/uL   HGB 10.3 (L) 11.6 - 15.9 g/dL   HCT 32.8 (L) 34.8 - 46.6 %   Platelets 294 145 - 400 10e3/uL   MCV 95.9 79.5 - 101.0 fL   MCH 30.3 25.1 - 34.0 pg   MCHC 31.6 31.5 - 36.0 g/dL   RBC 3.42 (L) 3.70 - 5.45 10e6/uL   RDW 15.4 (H) 11.2 - 14.5 %   lymph# 2.0 0.9 - 3.3 10e3/uL   MONO# 0.5 0.1 - 0.9 10e3/uL   Eosinophils Absolute 0.1 0.0 - 0.5 10e3/uL   Basophils Absolute 0.0 0.0 - 0.1 10e3/uL   NEUT% 39.3 38.4 - 76.8 %   LYMPH% 45.6 14.0 - 49.7 %   MONO% 12.1 0.0 -  14.0 %   EOS% 2.6 0.0 - 7.0 %   BASO% 0.4 0.0 - 2.0 %  Comprehensive metabolic panel (Cmet) - [facility]  Result Value Ref Range   Sodium 141 136 - 145 mEq/L   Potassium 4.0 3.5 - 5.1 mEq/L   Chloride 107 98 - 109 mEq/L   CO2 21 (L) 22 - 29 mEq/L   Glucose 92 70 - 140 mg/dl   BUN 12.6 7.0 - 26.0 mg/dL   Creatinine 0.7 0.6 - 1.1 mg/dL   Total Bilirubin 0.43 0.20 - 1.20 mg/dL   Alkaline Phosphatase 71 40 - 150 U/L   AST 23 5 - 34 U/L   ALT 25 0 - 55 U/L   Total Protein 7.0 6.4 - 8.3 g/dL   Albumin 3.9 3.5 - 5.0 g/dL   Calcium 9.3 8.4 - 10.4 mg/dL   Anion Gap 13 (H) 3 - 11 mEq/L   EGFR >90 >90 ml/min/1.73 m2     Studies/Results:  No results found.  Medications: I have reviewed the patient's current medications. Will increase gabapentin to 100 mg late afternoon and 100 mg at hs, and can increase further if that is helpful. Continue B vitamins.  DISCUSSION: She has reached maximum tolerance  for the taxol, so will stop adjuvant chemotherapy now and follow with observation. She will keep PAC for now. She is due colonoscopy by Dr Earle Gell. I have asked her to wait another couple of months out from chemo for this.  Assessment/Plan:  1.Triple negative right breast cancer 0.6 cm, 4 nodes negative: post right mastectomy with axillary node evaluation and immediate reconstruction with latissimus flap 11-16-13. Adjuvant chemo with 4 cycles carbo taxotere 12-28-13 thru 03-01-14, then weekly taxol x7 from 03-22-14 thru 9-806-9, complicated by significant peripheral neuropathy such that remainder of planned 12 cycles will not be given. I will see her back with PAC flush ~ early June. 2.left breast cancer 1996: 4 cm primary, 12 nodes involved, ER PR negative (HER 2 not tested in 1996), diagnosed when 7 months pregnant, treated with adriamycin prior to delivery, then [facility] to total adria dose 300 mg/m2, then high dose therapy with cytoxan, CDDP, carmustine and autologous BMT at [facility], then RT. No known  recurrent disease. Left TRAM reconstruction.  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 Increase gabapentin, continue B complex. 5.zoster shortly after completion of treatment 1996: prophylaxis with acyclovir due to steroids with this treatment 6.flu vaccine done 7.hysterectomy with oophorectomy 2011 8. Constipation resolved.Prn laxatives to keep bowels moving daily. 9.right chest pain muscle spasm: has flexeril if robaxin not tolerated. 10.PAC by Dr Barry Dienes 11.anemia related to chemo and multiple blood draws: improving with po iron, continue  All questions answered. Patient knows to call if needed prior to scheduled visit. Time spent 25 min including >50% counseling and coordination of care.  CC note Drs Barbaraann Share, M.Johnson    Gordy Levan, MD   06/06/2014, 2:04 PM

## 2014-06-06 NOTE — Patient Instructions (Signed)

## 2014-06-06 NOTE — Telephone Encounter (Signed)
per pof to sch pt appt-gave pt copy of sch °

## 2014-06-07 ENCOUNTER — Encounter: Payer: Self-pay | Admitting: Oncology

## 2014-06-07 DIAGNOSIS — Z1502 Genetic susceptibility to malignant neoplasm of ovary: Secondary | ICD-10-CM

## 2014-06-07 DIAGNOSIS — Z1509 Genetic susceptibility to other malignant neoplasm: Secondary | ICD-10-CM

## 2014-06-07 DIAGNOSIS — Z1501 Genetic susceptibility to malignant neoplasm of breast: Secondary | ICD-10-CM

## 2014-06-07 DIAGNOSIS — C50919 Malignant neoplasm of unspecified site of unspecified female breast: Secondary | ICD-10-CM | POA: Insufficient documentation

## 2014-06-07 NOTE — Progress Notes (Signed)
[facility] END OF TREATMENT   Name: RENALDA LOCKLIN Date: June 07, 2014  MRN: [id number] DOB: 1960/09/13   TREATMENT DATES: 12-28-2013 thru 03-01-2014 (4 cycles carboplatin taxotere)                                         03-22-2014 thru 05-17-2014 (7 cycles weekly taxol)   REFERRING PHYSICIAN: F.Byerly  DIAGNOSIS: triple negative right breast cancer  STAGE AT START OF TREATMENT: T1N0M0  INTENT: curative   DRUGS OR REGIMENS GIVEN: as above  MAJOR TOXICITIES: peripheral neuropathy feet and hands. Cytopenias. Fatigue. Nausea.   REASON TREATMENT STOPPED: intolerable peripheral neuropathy   PERFORMANCE STATUS AT END: 1   ONGOING PROBLEMS: peripheral neuropathy feet> hands, fatigue, chemo anemia   FOLLOW UP PLANS: MD exams and labs

## 2014-06-14 ENCOUNTER — Other Ambulatory Visit: Payer: Self-pay | Admitting: Oncology

## 2014-06-14 DIAGNOSIS — G629 Polyneuropathy, unspecified: Secondary | ICD-10-CM

## 2014-06-14 DIAGNOSIS — C50919 Malignant neoplasm of unspecified site of unspecified female breast: Secondary | ICD-10-CM

## 2014-06-17 NOTE — Telephone Encounter (Addendum)
Faxed signed orders dated 05-30-14 to [facility] for mastectomy supplies. Sent a copy to HIM to be scanned into patient's EMR.  Fax: [phone]

## 2014-07-24 ENCOUNTER — Other Ambulatory Visit: Payer: Self-pay | Admitting: Oncology

## 2014-07-28 ENCOUNTER — Ambulatory Visit (HOSPITAL_BASED_OUTPATIENT_CLINIC_OR_DEPARTMENT_OTHER): Payer: 59 | Admitting: Oncology

## 2014-07-28 ENCOUNTER — Other Ambulatory Visit (HOSPITAL_BASED_OUTPATIENT_CLINIC_OR_DEPARTMENT_OTHER): Payer: 59

## 2014-07-28 ENCOUNTER — Encounter: Payer: Self-pay | Admitting: Oncology

## 2014-07-28 ENCOUNTER — Telehealth: Payer: Self-pay | Admitting: Oncology

## 2014-07-28 ENCOUNTER — Ambulatory Visit: Payer: 59

## 2014-07-28 VITALS — BP 125/76 | HR 85 | Temp 98.7°F | Resp 20 | Ht 67.5 in | Wt 208.2 lb

## 2014-07-28 DIAGNOSIS — C50411 Malignant neoplasm of upper-outer quadrant of right female breast: Secondary | ICD-10-CM

## 2014-07-28 DIAGNOSIS — G622 Polyneuropathy due to other toxic agents: Secondary | ICD-10-CM | POA: Diagnosis not present

## 2014-07-28 DIAGNOSIS — Z853 Personal history of malignant neoplasm of breast: Secondary | ICD-10-CM

## 2014-07-28 DIAGNOSIS — G629 Polyneuropathy, unspecified: Secondary | ICD-10-CM

## 2014-07-28 DIAGNOSIS — Z171 Estrogen receptor negative status [ER-]: Secondary | ICD-10-CM

## 2014-07-28 DIAGNOSIS — C50912 Malignant neoplasm of unspecified site of left female breast: Secondary | ICD-10-CM

## 2014-07-28 DIAGNOSIS — D6481 Anemia due to antineoplastic chemotherapy: Secondary | ICD-10-CM | POA: Diagnosis not present

## 2014-07-28 DIAGNOSIS — G620 Drug-induced polyneuropathy: Secondary | ICD-10-CM

## 2014-07-28 DIAGNOSIS — Z1509 Genetic susceptibility to other malignant neoplasm: Secondary | ICD-10-CM

## 2014-07-28 DIAGNOSIS — Z95828 Presence of other vascular implants and grafts: Secondary | ICD-10-CM

## 2014-07-28 DIAGNOSIS — Z1501 Genetic susceptibility to malignant neoplasm of breast: Secondary | ICD-10-CM

## 2014-07-28 DIAGNOSIS — C50919 Malignant neoplasm of unspecified site of unspecified female breast: Secondary | ICD-10-CM

## 2014-07-28 DIAGNOSIS — T451X5A Adverse effect of antineoplastic and immunosuppressive drugs, initial encounter: Secondary | ICD-10-CM

## 2014-07-28 LAB — COMPREHENSIVE METABOLIC PANEL (CC13)
ALT: 25 U/L (ref 0–55)
AST: 25 U/L (ref 5–34)
Albumin: 3.8 g/dL (ref 3.5–5.0)
Alkaline Phosphatase: 70 U/L (ref 40–150)
Anion Gap: 8 mEq/L (ref 3–11)
BUN: 17.7 mg/dL (ref 7.0–26.0)
CO2: 25 mEq/L (ref 22–29)
Calcium: 9.2 mg/dL (ref 8.4–10.4)
Chloride: 107 mEq/L (ref 98–109)
Creatinine: 0.8 mg/dL (ref 0.6–1.1)
EGFR: >90 mL/min/{1.73_m2} (ref >90)
Glucose: 91 mg/dl (ref 70–140)
Potassium: 4.5 mEq/L (ref 3.5–5.1)
Sodium: 140 mEq/L (ref 136–145)
Total Bilirubin: 0.57 mg/dL (ref 0.20–1.20)
Total Protein: 6.9 g/dL (ref 6.4–8.3)

## 2014-07-28 LAB — CBC WITH DIFFERENTIAL/PLATELET
BASO%: 0.5 % (ref 0.0–2.0)
Basophils Absolute: 0 10*3/uL (ref 0.0–0.1)
EOS%: 2.7 % (ref 0.0–7.0)
Eosinophils Absolute: 0.1 10*3/uL (ref 0.0–0.5)
HCT: 34.7 % — ABNORMAL LOW (ref 34.8–46.6)
HGB: 11.2 g/dL — ABNORMAL LOW (ref 11.6–15.9)
LYMPH%: 42.9 % (ref 14.0–49.7)
MCH: 28.9 pg (ref 25.1–34.0)
MCHC: 32.3 g/dL (ref 31.5–36.0)
MCV: 89.7 fL (ref 79.5–101.0)
MONO#: 0.4 10*3/uL (ref 0.1–0.9)
MONO%: 8.7 % (ref 0.0–14.0)
NEUT#: 1.8 10*3/uL (ref 1.5–6.5)
NEUT%: 45.2 % (ref 38.4–76.8)
Platelets: 242 10*3/uL (ref 145–400)
RBC: 3.87 10*6/uL (ref 3.70–5.45)
RDW: 13.6 % (ref 11.2–14.5)
WBC: 4 10*3/uL (ref 3.9–10.3)
lymph#: 1.7 10*3/uL (ref 0.9–3.3)

## 2014-07-28 MED ORDER — SODIUM CHLORIDE 0.9 % IJ SOLN
10.0000 mL | INTRAMUSCULAR | Status: DC | PRN
  Administered 2014-07-28: 10 mL via INTRAVENOUS
  Filled 2014-07-28: qty 10

## 2014-07-28 MED ORDER — HEPARIN SOD (PORK) LOCK FLUSH 100 UNIT/ML IV SOLN
500.0000 [IU] | Freq: Once | INTRAVENOUS | Status: AC
  Administered 2014-07-28: 500 [IU] via INTRAVENOUS
  Filled 2014-07-28: qty 5

## 2014-07-28 MED ORDER — GABAPENTIN 100 MG PO CAPS: 100.0000 mg | ORAL_CAPSULE | Freq: Three times a day (TID) | ORAL | Status: DC

## 2014-07-28 NOTE — Telephone Encounter (Signed)
Confirmed appointment for August and September.

## 2014-07-28 NOTE — Patient Instructions (Signed)

## 2014-07-28 NOTE — Progress Notes (Signed)
OFFICE PROGRESS NOTE   July 28, 2014   Physicians:F.Barry Dienes, D.Harlow Mares, K.Winifred Olive (GYN), S.Squire, M.Johnson  INTERVAL HISTORY:   Patient is seen, alone for visit, in scheduled follow up of bilateral breast cancer, having completed adjuvant treatment for triple negative right breast cancer on 05-17-14. She is doing gradually better overall, tho still symptomatic from peripheral neuropathy.  Patient returned to work part time x 2 weeks, now has been full time since 07-19-14. Neuropathy is most noticeable in feet, from metatarsal heads to toes "feel puffy and like something is under them", but has been able to find comfortable shoes and seems to have more sensation including the blanket at foot of bed. Right hand is less symptomatic than left, however she is able to keyboard without difficulty now. Gabapentin 100 mg AM and hs is helpful, may need to increase this to tid.  Otherwise energy is improving, tho she mostly sits at work and is fatigued and frequently naps when she gets home. Appetite good, bowels ok, no SOB with this activity. No bleeding. No pain right back with positioning at work. No swelling LE.     PAC in Multiple BRCA 1 and 2 variants Left breast cancer 1996 treated with chemo including high dose with autologous BMT.   ONCOLOGIC HISTORY History is of left breast cancer in June 1996, when she was [redacted] weeks pregnant. This was 4x4x2.5 cm poorly differentiated invasive ductal and comedo carciinoma, ER PR negative, HER 2 not tested in 1996. She was treated with 2 cycles of adriamycin at 60 mg/m2 in [place] as she completed the pregnancy, then had left mastectomy with 12 of 23 left axillary nodes positive. She had additional adriamycin and cytoxan in [place] to total adriamycin dose of 300 mg/m2, followed by high dose chemotherapy with cytoxan, CDDP and autologous transplant on protocol at [place] (Dr J.Vredenburgh) , and local radiation on left. We have received  treatment records from [facility] for high dose chemotherapy and autologous transplant done 1996, which I have reviewed and which will be scanned into this EMR. She had left TRAM reconstruction. She had multiple BRCA 1 and 2 variants at last genetics testing. She had hysterectomy with oophorectomy ~ 08-2009, but was reluctant to proceed with prophylactic right mastectomy. She was followed with mammograms + yearly MRIs due to BRCA abnormalities. Daughter [name] is healthy, age 29.  Right breast cancer was identified on tomo mammography at [facility] 09-24-13, with heterogeneously dense breast tissue and suspicious area upper outer quadrant. She had MRI 09-27-13 with 9x5x6 mm mass upper outer quadrant on right, otherwise unremarkable bilaterally. US biopsy at [facility] 10-04-13 [phone]) documented invasive mammary carcinoma ER PR and HER 2 negative and proliferation fraction 87%. After consultation with general surgery, plastic surgery, medical oncology and radiation oncology, she agreed to right mastectomy. She had staging CT CAP 10-13-13, unremarkable other than 5 mm low density lesion in dome of right hepatic lobe likely cyst and scarring left lung apex likely from prior RT.She had right mastectomy with sentinel node evaluation and immediate reconstruction with right latissimus myocutaneous flap + saline implant by Drs Barry Dienes and Harlow Mares on 11-16-13. [place] pathology [id number] from 11-16-13 had grade 3 invasive ductal carcinoma 0.6 cm with 3 sentinel axillary nodes + 1 nonsentinel node negative, closest margin 1 cm, no LVSI, repeat ER and PR testing both 0% and not sufficient tumor for repeat HER 2 (which had been negative on diagnostic biopsy 09-30-13 [phone]). She had first carboplatin + taxotere on 12-28-13,  with neulasta day 2. She completed cycle 4 carbo taxotere on 03-01-14. First weekly taxol 03-22-14, planned x 12 weeks. Weeks 7 and 8 held due to progressive neuropathy hands and feet, with some  improvement following that break. Taxol resumed on 05-17-14, with worsening of peripheral neuropathy. Total of 7 taxol treatments given.     Review of systems as above, also: No fever or symptoms of infection. No problems with PAC. Hair is growing back.  Remainder of 10 point Review of Systems negative.  Objective:  Vital signs in last 24 hours:  BP 125/76 mmHg  Pulse 85  Temp(Src) 98.7 F (37.1 C) (Oral)  Resp 20  Ht 5' 7.5" (1.715 m)  Wt 208 lb 3.2 oz (94.439 kg)  BMI 32.11 kg/m2  SpO2 95% Weight up 1 lb Alert, oriented and appropriate. Ambulatory without assistance.  Hair just growing back.  HEENT:PERRL, sclerae not icteric. Oral mucosa moist without lesions, posterior pharynx clear.  Neck supple. No JVD.  Lymphatics:no cervical,surpaclavicular, axillary  adenopathy Resp: clear to auscultation bilaterally and normal percussion bilaterally Cardio: regular rate and rhythm. No gallop. GI: soft, nontender, not distended, no mass or organomegaly. Normally active bowel sounds. Surgical incision not remarkable. Musculoskeletal/ Extremities: without pitting edema, cords, tenderness Neuro:  peripheral neuropathy as noted. Otherwise nonfocal. Psych appropriate mood and affect Skin without rash, ecchymosis, petechiae. Scar from muscle flap right back well healed.  Breasts: bilateral reconstructions without dominant mass or concerns for local recurrence. Axillae benign. Portacath-without erythema or tenderness  Lab Results:  Results for orders placed or performed in visit on 07/28/14  CBC with Differential  Result Value Ref Range   WBC 4.0 3.9 - 10.3 10e3/uL   NEUT# 1.8 1.5 - 6.5 10e3/uL   HGB 11.2 (L) 11.6 - 15.9 g/dL   HCT 34.7 (L) 34.8 - 46.6 %   Platelets 242 145 - 400 10e3/uL   MCV 89.7 79.5 - 101.0 fL   MCH 28.9 25.1 - 34.0 pg   MCHC 32.3 31.5 - 36.0 g/dL   RBC 3.87 3.70 - 5.45 10e6/uL   RDW 13.6 11.2 - 14.5 %   lymph# 1.7 0.9 - 3.3 10e3/uL   MONO# 0.4 0.1 - 0.9  10e3/uL   Eosinophils Absolute 0.1 0.0 - 0.5 10e3/uL   Basophils Absolute 0.0 0.0 - 0.1 10e3/uL   NEUT% 45.2 38.4 - 76.8 %   LYMPH% 42.9 14.0 - 49.7 %   MONO% 8.7 0.0 - 14.0 %   EOS% 2.7 0.0 - 7.0 %   BASO% 0.5 0.0 - 2.0 %    Hemoglobin prior to start of chemo ~ 11.8 CMET available after visit entirely normal  Studies/Results:  No results found.  Medications: I have reviewed the patient's current medications. She will finish the iron that she has available, probably 1-2 months' worth, then DC.  DISCUSSION: She will not have further mammograms as she has had bilateral mastectomies (with reconstructions). Continue PAC flushes every 6-8 weeks. I will coordinate my visits with flushes as possible.  Discussed podiatrist referral at some point if she would like, as orthotics sometimes can help the neuropathy symptoms.   Assessment/Plan:  1.Triple negative right breast cancer 0.6 cm, 4 nodes negative: post right mastectomy with axillary node evaluation and immediate reconstruction with latissimus flap 11-16-13. Adjuvant chemo with 4 cycles carbo taxotere 12-28-13 thru 03-01-14, then weekly taxol x7 from 03-22-14 thru 2-641-5, complicated by significant peripheral neuropathy such that remainder of planned 12 cycles not given. I will see her back  with PAC flush ~ late Sept. Labs from [facility] with flushes.  2.left breast cancer 1996: 4 cm primary, 12 nodes involved, ER PR negative (HER 2 not tested in 1996), diagnosed when 7 months pregnant, treated with adriamycin prior to delivery, then [facility] to total adria dose 300 mg/m2, then high dose therapy with cytoxan, CDDP, carmustine and autologous BMT at [facility], then RT. No known recurrent disease. Left TRAM reconstruction.  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 Increase gabapentin to 100 mg tid as this is helping, continue B complex. Consider podiatry evaluation.  5.zoster shortly  after completion of treatment 1996 6.flu vaccine due this fall 7.hysterectomy with oophorectomy 2011 8. Constipation resolved. 9.right chest pain muscle spasm not bothersome now: has prn muscle relaxants 10.PAC by Dr Barry Dienes, which she will keep for now. 11.anemia related to chemo and multiple blood draws: improving with po iron, continue   All questions answered. Will flush PAC with labs in 2 months and she knows to call if concerns prior to MD visit with [facility] flush/ labs in 4 months. Time spent 25 min including >50% counseling and coordination of care    LIVESAY,LENNIS P, MD   07/28/2014, 9:13 AM

## 2014-09-22 ENCOUNTER — Other Ambulatory Visit (HOSPITAL_BASED_OUTPATIENT_CLINIC_OR_DEPARTMENT_OTHER): Payer: 59

## 2014-09-22 ENCOUNTER — Ambulatory Visit: Payer: 59

## 2014-09-22 VITALS — BP 132/55 | HR 70 | Temp 98.8°F | Resp 16

## 2014-09-22 DIAGNOSIS — C50411 Malignant neoplasm of upper-outer quadrant of right female breast: Secondary | ICD-10-CM | POA: Diagnosis not present

## 2014-09-22 DIAGNOSIS — Z95828 Presence of other vascular implants and grafts: Secondary | ICD-10-CM

## 2014-09-22 LAB — COMPREHENSIVE METABOLIC PANEL (CC13)
ALBUMIN: 3.9 g/dL (ref 3.5–5.0)
ALT: 24 U/L (ref 0–55)
ANION GAP: 9 meq/L (ref 3–11)
AST: 20 U/L (ref 5–34)
Alkaline Phosphatase: 64 U/L (ref 40–150)
BUN: 15.2 mg/dL (ref 7.0–26.0)
CO2: 25 mEq/L (ref 22–29)
CREATININE: 0.8 mg/dL (ref 0.6–1.1)
Calcium: 9.4 mg/dL (ref 8.4–10.4)
Chloride: 107 mEq/L (ref 98–109)
EGFR: >90 mL/min/{1.73_m2} (ref >90)
Glucose: 120 mg/dl (ref 70–140)
POTASSIUM: 4.3 meq/L (ref 3.5–5.1)
SODIUM: 141 meq/L (ref 136–145)
Total Bilirubin: 0.84 mg/dL (ref 0.20–1.20)
Total Protein: 7.1 g/dL (ref 6.4–8.3)

## 2014-09-22 LAB — CBC WITH DIFFERENTIAL/PLATELET
BASO%: 0.8 % (ref 0.0–2.0)
Basophils Absolute: 0 10*3/uL (ref 0.0–0.1)
EOS ABS: 0.1 10*3/uL (ref 0.0–0.5)
EOS%: 1.7 % (ref 0.0–7.0)
HCT: 36.5 % (ref 34.8–46.6)
HEMOGLOBIN: 11.9 g/dL (ref 11.6–15.9)
LYMPH#: 1.7 10*3/uL (ref 0.9–3.3)
LYMPH%: 41.1 % (ref 14.0–49.7)
MCH: 28.5 pg (ref 25.1–34.0)
MCHC: 32.5 g/dL (ref 31.5–36.0)
MCV: 87.5 fL (ref 79.5–101.0)
MONO#: 0.3 10*3/uL (ref 0.1–0.9)
MONO%: 7.4 % (ref 0.0–14.0)
NEUT#: 2 10*3/uL (ref 1.5–6.5)
NEUT%: 49 % (ref 38.4–76.8)
PLATELETS: 242 10*3/uL (ref 145–400)
RBC: 4.17 10*6/uL (ref 3.70–5.45)
RDW: 15 % — ABNORMAL HIGH (ref 11.2–14.5)
WBC: 4.1 10*3/uL (ref 3.9–10.3)

## 2014-09-22 MED ORDER — SODIUM CHLORIDE 0.9 % IJ SOLN
10.0000 mL | INTRAMUSCULAR | Status: DC | PRN
  Administered 2014-09-22: 10 mL via INTRAVENOUS
  Filled 2014-09-22: qty 10

## 2014-09-22 MED ORDER — HEPARIN SOD (PORK) LOCK FLUSH 100 UNIT/ML IV SOLN
500.0000 [IU] | Freq: Once | INTRAVENOUS | Status: AC
  Administered 2014-09-22: 500 [IU] via INTRAVENOUS
  Filled 2014-09-22: qty 5

## 2014-09-22 NOTE — Patient Instructions (Signed)

## 2014-09-23 ENCOUNTER — Telehealth: Payer: Self-pay

## 2014-09-23 NOTE — Telephone Encounter (Signed)
-----   Message from Gordy Levan, MD sent at 09/22/2014  1:20 PM EDT ----- Labs seen and need follow up: please let her know counts and chemistries all good

## 2014-09-23 NOTE — Telephone Encounter (Signed)
Told Ms. Huq that her labs from 09-22-14 were fine as noted below by Dr. Marko Plume

## 2014-10-29 ENCOUNTER — Other Ambulatory Visit: Payer: Self-pay | Admitting: Oncology

## 2014-10-29 DIAGNOSIS — T451X5A Adverse effect of antineoplastic and immunosuppressive drugs, initial encounter: Principal | ICD-10-CM

## 2014-10-29 DIAGNOSIS — G620 Drug-induced polyneuropathy: Secondary | ICD-10-CM

## 2014-11-13 ENCOUNTER — Other Ambulatory Visit: Payer: Self-pay | Admitting: Oncology

## 2014-11-17 ENCOUNTER — Ambulatory Visit: Payer: 59

## 2014-11-17 ENCOUNTER — Other Ambulatory Visit (HOSPITAL_BASED_OUTPATIENT_CLINIC_OR_DEPARTMENT_OTHER): Payer: 59

## 2014-11-17 ENCOUNTER — Encounter: Payer: Self-pay | Admitting: Oncology

## 2014-11-17 ENCOUNTER — Ambulatory Visit (HOSPITAL_BASED_OUTPATIENT_CLINIC_OR_DEPARTMENT_OTHER): Payer: 59 | Admitting: Oncology

## 2014-11-17 ENCOUNTER — Telehealth: Payer: Self-pay | Admitting: Oncology

## 2014-11-17 VITALS — BP 102/78 | HR 68 | Temp 99.4°F | Resp 18 | Ht 67.5 in | Wt 214.9 lb

## 2014-11-17 DIAGNOSIS — Z95828 Presence of other vascular implants and grafts: Secondary | ICD-10-CM

## 2014-11-17 DIAGNOSIS — Z23 Encounter for immunization: Secondary | ICD-10-CM | POA: Diagnosis not present

## 2014-11-17 DIAGNOSIS — Z9889 Other specified postprocedural states: Secondary | ICD-10-CM

## 2014-11-17 DIAGNOSIS — C50911 Malignant neoplasm of unspecified site of right female breast: Secondary | ICD-10-CM

## 2014-11-17 DIAGNOSIS — Z1501 Genetic susceptibility to malignant neoplasm of breast: Secondary | ICD-10-CM

## 2014-11-17 DIAGNOSIS — C50411 Malignant neoplasm of upper-outer quadrant of right female breast: Secondary | ICD-10-CM

## 2014-11-17 DIAGNOSIS — T451X5A Adverse effect of antineoplastic and immunosuppressive drugs, initial encounter: Secondary | ICD-10-CM

## 2014-11-17 DIAGNOSIS — Z90722 Acquired absence of ovaries, bilateral: Secondary | ICD-10-CM

## 2014-11-17 DIAGNOSIS — Z90710 Acquired absence of both cervix and uterus: Secondary | ICD-10-CM

## 2014-11-17 DIAGNOSIS — G622 Polyneuropathy due to other toxic agents: Secondary | ICD-10-CM | POA: Diagnosis not present

## 2014-11-17 DIAGNOSIS — Z9079 Acquired absence of other genital organ(s): Secondary | ICD-10-CM

## 2014-11-17 DIAGNOSIS — C50912 Malignant neoplasm of unspecified site of left female breast: Secondary | ICD-10-CM

## 2014-11-17 DIAGNOSIS — G620 Drug-induced polyneuropathy: Secondary | ICD-10-CM

## 2014-11-17 LAB — CBC WITH DIFFERENTIAL/PLATELET
BASO%: 0.6 % (ref 0.0–2.0)
Basophils Absolute: 0 10*3/uL (ref 0.0–0.1)
EOS%: 2.3 % (ref 0.0–7.0)
Eosinophils Absolute: 0.1 10*3/uL (ref 0.0–0.5)
HEMATOCRIT: 36.1 % (ref 34.8–46.6)
HEMOGLOBIN: 11.8 g/dL (ref 11.6–15.9)
LYMPH#: 2 10*3/uL (ref 0.9–3.3)
LYMPH%: 44.9 % (ref 14.0–49.7)
MCH: 28.8 pg (ref 25.1–34.0)
MCHC: 32.6 g/dL (ref 31.5–36.0)
MCV: 88.4 fL (ref 79.5–101.0)
MONO#: 0.4 10*3/uL (ref 0.1–0.9)
MONO%: 9.5 % (ref 0.0–14.0)
NEUT%: 42.7 % (ref 38.4–76.8)
NEUTROS ABS: 1.9 10*3/uL (ref 1.5–6.5)
PLATELETS: 254 10*3/uL (ref 145–400)
RBC: 4.09 10*6/uL (ref 3.70–5.45)
RDW: 14.7 % — AB (ref 11.2–14.5)
WBC: 4.4 10*3/uL (ref 3.9–10.3)

## 2014-11-17 LAB — COMPREHENSIVE METABOLIC PANEL (CC13)
ALT: 29 U/L (ref 0–55)
AST: 21 U/L (ref 5–34)
Albumin: 3.9 g/dL (ref 3.5–5.0)
Alkaline Phosphatase: 75 U/L (ref 40–150)
Anion Gap: 8 mEq/L (ref 3–11)
BILIRUBIN TOTAL: 0.64 mg/dL (ref 0.20–1.20)
BUN: 15 mg/dL (ref 7.0–26.0)
CALCIUM: 9.5 mg/dL (ref 8.4–10.4)
CO2: 24 mEq/L (ref 22–29)
CREATININE: 0.9 mg/dL (ref 0.6–1.1)
Chloride: 109 mEq/L (ref 98–109)
EGFR: 88 mL/min/{1.73_m2} — ABNORMAL LOW (ref >90)
Glucose: 89 mg/dl (ref 70–140)
Potassium: 4.4 mEq/L (ref 3.5–5.1)
Sodium: 142 mEq/L (ref 136–145)
TOTAL PROTEIN: 7 g/dL (ref 6.4–8.3)

## 2014-11-17 MED ORDER — HEPARIN SOD (PORK) LOCK FLUSH 100 UNIT/ML IV SOLN
500.0000 [IU] | Freq: Once | INTRAVENOUS | Status: AC
  Administered 2014-11-17: 500 [IU] via INTRAVENOUS
  Filled 2014-11-17: qty 5

## 2014-11-17 MED ORDER — SODIUM CHLORIDE 0.9 % IJ SOLN
10.0000 mL | INTRAMUSCULAR | Status: DC | PRN
  Administered 2014-11-17: 10 mL via INTRAVENOUS
  Filled 2014-11-17: qty 10

## 2014-11-17 MED ORDER — INFLUENZA VAC SPLIT QUAD 0.5 ML IM SUSY
0.5000 mL | PREFILLED_SYRINGE | Freq: Once | INTRAMUSCULAR | Status: AC
  Administered 2014-11-17: 0.5 mL via INTRAMUSCULAR
  Filled 2014-11-17: qty 0.5

## 2014-11-17 NOTE — Telephone Encounter (Signed)
Heidi Sexton from Dr. Phoebe Perch office called re appointment. Patient schedule with Dr. Amalia Hailey 10/5 @ 10:15 am. Spoke with patient re date/time/location/number and 24 hr cancellation policy. Patient given avs report and appointments for October and December prior to leaving today.

## 2014-11-17 NOTE — Patient Instructions (Signed)

## 2014-11-17 NOTE — Progress Notes (Signed)
OFFICE PROGRESS NOTE   November 17, 2014   Physicians:F.Barry Dienes, D.Harlow Mares, K.Winifred Olive (GYN), S.Squire, M.Johnson  INTERVAL HISTORY:  Patient is seen, alone for visit, in scheduled follow up of bilateral breast cancer, most recently triple negative node negative on right 10-2013. She has been on observation since completing adjuvant chemotherapy on 05-17-14.  PAC is in, flushed every 6-8 weeks. This did not draw blood at flush today, will follow up with possible TPA within the week.   Patient is doing well other than residual peripheral neuropathy in feet > hands from chemotherapy, last taxol given 05-17-14. She has numbness from metatarsal heads thru toes bilaterally, which is improved over situation at completion of chemo, but still feels as if she has "cotton wads between toes". Gabapentin 100 mg bid does improve the symptoms and does not make her drowsy now, so will increase this to 200 mg bid. She is interested in referral to podiatrist for possible orthotics or other recommendations.  Otherwise energy is good and nothing that seems of concern from standpoint of breast cancer, that treatment or BRCA + status.   PAC in, flushed today. Multiple BRCA 1 and 2 variants Left breast cancer 1996 treated with chemo including high dose with autologous BMT. Hysterectomy with oophorectomy 2011 Flu vaccine 11-17-14   ONCOLOGIC HISTORY History is of left breast cancer in June 1996, when she was [redacted] weeks pregnant. This was 4x4x2.5 cm poorly differentiated invasive ductal and comedo carciinoma, ER PR negative, HER 2 not tested in 1996. She was treated with 2 cycles of adriamycin at 60 mg/m2 in [place] as she completed the pregnancy, then had left mastectomy with 12 of 23 left axillary nodes positive. She had additional adriamycin and cytoxan in [place] to total adriamycin dose of 300 mg/m2, followed by high dose chemotherapy with cytoxan, CDDP and autologous transplant on protocol at [facility] (Dr J.Vredenburgh) , and local radiation on left. We have received treatment records from [facility] for high dose chemotherapy and autologous transplant done 1996, which I have reviewed and which will be scanned into this EMR. She had left TRAM reconstruction. She had multiple BRCA 1 and 2 variants at last genetics testing. She had hysterectomy with oophorectomy ~ 08-2009, but was reluctant to proceed with prophylactic right mastectomy. She was followed with mammograms + yearly MRIs due to BRCA abnormalities. Daughter [name] is healthy, age 52.  Right breast cancer was identified on tomo mammography at [facility] 09-24-13, with heterogeneously dense breast tissue and suspicious area upper outer quadrant. She had MRI 09-27-13 with 9x5x6 mm mass upper outer quadrant on right, otherwise unremarkable bilaterally. US biopsy at [facility] 10-04-13 [phone]) documented invasive mammary carcinoma ER PR and HER 2 negative and proliferation fraction 87%. After consultation with general surgery, plastic surgery, medical oncology and radiation oncology, she agreed to right mastectomy. She had staging CT CAP 10-13-13, unremarkable other than 5 mm low density lesion in dome of right hepatic lobe likely cyst and scarring left lung apex likely from prior RT.She had right mastectomy with sentinel node evaluation and immediate reconstruction with right latissimus myocutaneous flap + saline implant by Drs Barry Dienes and Harlow Mares on 11-16-13. [place] pathology [phone] from 11-16-13 had grade 3 invasive ductal carcinoma 0.6 cm with 3 sentinel axillary nodes + 1 nonsentinel node negative, closest margin 1 cm, no LVSI, repeat ER and PR testing both 0% and not sufficient tumor for repeat HER 2 (which had been negative on diagnostic biopsy 09-30-13 [phone]). She had first carboplatin +  taxotere on 12-28-13, with neulasta day 2. She completed cycle 4 carbo taxotere on 03-01-14. First weekly taxol 03-22-14, planned x 12 weeks. Weeks  7 and 8 held due to progressive neuropathy hands and feet, with some improvement following that break. Taxol resumed on 05-17-14, with worsening of peripheral neuropathy. Total of 7 taxol treatments given.     Review of systems as above, also: No SOB or other respiratory symptoms. No new or different pain tho she aches at scar from latissimus flap on right back with changes in weather. Bowels ok. No LE swelling. Asymmetry of reconstructed breasts annoying, but she does not want other surgery now Remainder of 10 point Review of Systems negative.  Objective:  Vital signs in last 24 hours:  BP 102/78 mmHg  Pulse 68  Temp(Src) 99.4 F (37.4 C) (Oral)  Resp 18  Ht 5' 7.5" (1.715 m)  Wt 214 lb 14.4 oz (97.478 kg)  BMI 33.14 kg/m2  SpO2 100% Weight up 6 lbs. Alert, oriented and appropriate. Ambulatory without difficulty, wearing sneakers No alopecia  HEENT:PERRL, sclerae not icteric. Oral mucosa moist without lesions, posterior pharynx clear.  Neck supple. No JVD.  Lymphatics:no cervical,supraclavicular, axillary or inguinal adenopathy Resp: clear to auscultation bilaterally and normal percussion bilaterally Cardio: regular rate and rhythm. No gallop. GI: soft, nontender, not distended, no mass or organomegaly. Normally active bowel sounds.  Musculoskeletal/ Extremities: without pitting edema, cords, tenderness Neuro: peripheral neuropathy distal feet as noted. Otherwise nonfocal Skin without rash, ecchymosis, petechiae Breasts: bilateral reconstructions without evidence of local recurrence. Axillae benign. Portacath-without erythema or tenderness, note no blood draw today, no local swelling including supraclavicular or UE  Lab Results:  Results for orders placed or performed in visit on 11/17/14  CBC with Differential  Result Value Ref Range   WBC 4.4 3.9 - 10.3 10e3/uL   NEUT# 1.9 1.5 - 6.5 10e3/uL   HGB 11.8 11.6 - 15.9 g/dL   HCT 36.1 34.8 - 46.6 %   Platelets 254 145 - 400  10e3/uL   MCV 88.4 79.5 - 101.0 fL   MCH 28.8 25.1 - 34.0 pg   MCHC 32.6 31.5 - 36.0 g/dL   RBC 4.09 3.70 - 5.45 10e6/uL   RDW 14.7 (H) 11.2 - 14.5 %   lymph# 2.0 0.9 - 3.3 10e3/uL   MONO# 0.4 0.1 - 0.9 10e3/uL   Eosinophils Absolute 0.1 0.0 - 0.5 10e3/uL   Basophils Absolute 0.0 0.0 - 0.1 10e3/uL   NEUT% 42.7 38.4 - 76.8 %   LYMPH% 44.9 14.0 - 49.7 %   MONO% 9.5 0.0 - 14.0 %   EOS% 2.3 0.0 - 7.0 %   BASO% 0.6 0.0 - 2.0 %  Comprehensive metabolic panel (Cmet) - [facility]  Result Value Ref Range   Sodium 142 136 - 145 mEq/L   Potassium 4.4 3.5 - 5.1 mEq/L   Chloride 109 98 - 109 mEq/L   CO2 24 22 - 29 mEq/L   Glucose 89 70 - 140 mg/dl   BUN 15.0 7.0 - 26.0 mg/dL   Creatinine 0.9 0.6 - 1.1 mg/dL   Total Bilirubin 0.64 0.20 - 1.20 mg/dL   Alkaline Phosphatase 75 40 - 150 U/L   AST 21 5 - 34 U/L   ALT 29 0 - 55 U/L   Total Protein 7.0 6.4 - 8.3 g/dL   Albumin 3.9 3.5 - 5.0 g/dL   Calcium 9.5 8.4 - 10.4 mg/dL   Anion Gap 8 3 - 11 mEq/L  EGFR 88 (L) >90 ml/min/1.73 m2     Studies/Results:  No results found.  Medications: I have reviewed the patient's current medications. With next prescription for gabapentin will need to dose at least 200 mg bid.  DISCUSSION: peripheral neuropathy in feet related to chemotherapy better but still significant now 6 months out from treatment. Increase gabapentin, ask podiatrist for any other recommendations.  Assessment/Plan:  1.Triple negative right breast cancer 0.6 cm, 4 nodes negative: post right mastectomy with axillary node evaluation and immediate reconstruction with latissimus flap 11-16-13. Adjuvant chemo with 4 cycles carbo taxotere 12-28-13 thru 03-01-14, then weekly taxol x7 from 03-22-14 thru 4-932-4, complicated by significant peripheral neuropathy such that remainder of planned 12 cycles not given. I will see her back coordinating with PAC flush in Dec 2.left breast cancer 1996: 4 cm primary, 12 nodes involved, ER PR negative (HER 2 not  tested in 1996), diagnosed when 7 months pregnant, treated with adriamycin prior to delivery, then [facility] to total adria dose 300 mg/m2, then high dose therapy with cytoxan, CDDP, carmustine and autologous BMT at [facility], then RT. No known recurrent disease. Left TRAM reconstruction.  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 Increase gabapentin to 200 mg bid, continue B complex, podiatry evaluation 5.zoster shortly after completion of treatment 1996 6.flu vaccine done today 7.hysterectomy with oophorectomy 2011 8. PAC in, placed by Dr Barry Dienes. WIll return for flush/ possible TPA in next few days. If not functional would be ok to DC, otherwise with limited access bilateral UE, still seems reasonable to keep for now. 9.right chest pain muscle spasm not bothersome now: has prn muscle relaxants 10.Marland Kitchenanemia related to chemo etc resolved.   Patient is in agreement with recommendations and plans above, knows to call prior to scheduled appointment if needed. TIme spent 25 min including >50% counseling and coordination of care. Cc Drs Lysle Rubens, Barry Dienes, [place]   Gordy Levan, MD   11/17/2014, 2:07 PM

## 2014-11-17 NOTE — Progress Notes (Signed)
Pt port accessed using sterile procedure. No blood return. Port flushes easy, pt reports saline smell and taste. Heparin instilled in port with no results. Port de-accessed. Pt sent to lab for peripheral lab draw. Dr. Marko Plume nurse notified.

## 2014-11-19 DIAGNOSIS — C50912 Malignant neoplasm of unspecified site of left female breast: Secondary | ICD-10-CM | POA: Insufficient documentation

## 2014-11-19 DIAGNOSIS — G620 Drug-induced polyneuropathy: Secondary | ICD-10-CM | POA: Insufficient documentation

## 2014-11-19 DIAGNOSIS — T451X5A Adverse effect of antineoplastic and immunosuppressive drugs, initial encounter: Secondary | ICD-10-CM

## 2014-11-23 ENCOUNTER — Ambulatory Visit: Payer: 59 | Admitting: Podiatry

## 2014-11-28 ENCOUNTER — Telehealth: Payer: Self-pay | Admitting: Oncology

## 2014-11-28 NOTE — Telephone Encounter (Signed)
Called and left a message with a new flush appointment per patients voicemail

## 2014-11-30 ENCOUNTER — Ambulatory Visit (HOSPITAL_BASED_OUTPATIENT_CLINIC_OR_DEPARTMENT_OTHER): Payer: 59

## 2014-11-30 VITALS — BP 130/68 | HR 70 | Temp 97.9°F | Resp 16

## 2014-11-30 DIAGNOSIS — C50411 Malignant neoplasm of upper-outer quadrant of right female breast: Secondary | ICD-10-CM | POA: Diagnosis not present

## 2014-11-30 DIAGNOSIS — Z95828 Presence of other vascular implants and grafts: Secondary | ICD-10-CM

## 2014-11-30 DIAGNOSIS — Z452 Encounter for adjustment and management of vascular access device: Secondary | ICD-10-CM | POA: Diagnosis not present

## 2014-11-30 MED ORDER — ALTEPLASE 2 MG IJ SOLR
2.0000 mg | Freq: Once | INTRAMUSCULAR | Status: AC
  Administered 2014-11-30: 2 mg
  Filled 2014-11-30: qty 2

## 2014-11-30 MED ORDER — SODIUM CHLORIDE 0.9 % IJ SOLN
10.0000 mL | INTRAMUSCULAR | Status: DC | PRN
  Administered 2014-11-30: 10 mL via INTRAVENOUS
  Filled 2014-11-30: qty 10

## 2014-11-30 MED ORDER — HEPARIN SOD (PORK) LOCK FLUSH 100 UNIT/ML IV SOLN
500.0000 [IU] | Freq: Once | INTRAVENOUS | Status: AC
  Administered 2014-11-30: 500 [IU] via INTRAVENOUS
  Filled 2014-11-30: qty 5

## 2014-11-30 NOTE — Progress Notes (Signed)
Port gave excellent blood return after alteplase instilled for 30 minutes. Flushed per protocol. Patient tolerated well.

## 2014-11-30 NOTE — Patient Instructions (Signed)
Patient's port accessed.  No blood return.  Port flushes well.  Patient sent to infusion room for TPA per Dr Mariana Kaufman note.

## 2014-12-06 ENCOUNTER — Telehealth: Payer: Self-pay

## 2014-12-06 ENCOUNTER — Encounter: Payer: Self-pay | Admitting: Podiatry

## 2014-12-06 ENCOUNTER — Ambulatory Visit (INDEPENDENT_AMBULATORY_CARE_PROVIDER_SITE_OTHER): Payer: 59 | Admitting: Podiatry

## 2014-12-06 VITALS — BP 125/68 | HR 72 | Resp 12

## 2014-12-06 DIAGNOSIS — T451X5A Adverse effect of antineoplastic and immunosuppressive drugs, initial encounter: Secondary | ICD-10-CM | POA: Diagnosis not present

## 2014-12-06 DIAGNOSIS — G620 Drug-induced polyneuropathy: Secondary | ICD-10-CM

## 2014-12-06 NOTE — Progress Notes (Signed)
   Subjective:    Patient ID: Heidi Sexton, female    DOB: 1960/03/22, 54 y.o.   MRN: [id number]  HPI    This patient presents today complaining of uncomfortable feelings including tingling, cramping, pain, and leg toes are broken and she can't move them in the right and left feet. The symptoms occur on and off weightbearing. Patient says that she's in a seated position and does not move her toes at all the symptoms tend to reduce. Any motion, or direct pressure or weightbearing causes uncomfortable feelings. Patient is currently taking gabapentin 100 mg 2 tablets twice a day which she says have reduced the symptoms in her hands, however, not or feet. Patient denies any problems from gabapentin including peripheral edema or suicidal thoughts .Patient had undergone chemotherapy from November 2015 until April 2016. She said she had to stop taking the chemotherapy because of unpleasant neuropathic pain.  Patient has a history of breast cancer and has had chemotherapy as described above  Review of Systems  Constitutional: Positive for appetite change.  HENT: Positive for sinus pressure.   Musculoskeletal: Positive for back pain.  Skin: Positive for color change.  Allergic/Immunologic: Positive for environmental allergies.  Neurological: Positive for light-headedness and numbness.  Hematological: Bruises/bleeds easily.  All other systems reviewed and are negative.      Objective:   Physical Exam  Pleasant orientated 3  Vascular: No peripheral edema noted bilaterally DP and PT pulses 2/4 bilaterally Capillary reflex immediate bilaterally  Neurological: Sedation to 10 g monofilament wire intact 5/5 bilaterally Vibratory sensation intact bilaterally Ankle reflex equal and reactive bilaterally  Dermatological: Texture and turgor within normal limits No skin lesions noted bilaterally  Musculoskeletal: HAV deformities bilaterally Manual motor testing dorsi flexion, plantar  flexion, inversion, eversion 5/5       Assessment & Plan:   Assessment: Symptoms are consistent with chemotherapy-induced peripheral neuropathy  Plan: Today I reviewed the results of the examination with patient today. I made aware that the symptoms are neuropathic and I recommend gradually increasing her gabapentin dosage until the symptoms in her feet decrease.

## 2014-12-06 NOTE — Telephone Encounter (Signed)
Scheduled an appointment for a PAC flush for 12-27-14 at 1600.  Heidi Sexton verbalized understanding.

## 2014-12-06 NOTE — Telephone Encounter (Signed)
-----   Message from Randolm Idol, [place] sent at 11/30/2014  5:03 PM EDT ----- Regarding: port flush Heidi Sexton,  patient here for flush appt,  port would not give blood return. Alteplase instilled for  30 minutes, excellent blood return. She is scheduled to see dr Marko Plume in dec. She really needs a port flush before then. Please help her with schedule. Thanks, dixie

## 2014-12-06 NOTE — Patient Instructions (Signed)
Symptoms that you're experiencing in your feet including tingling, cramping, pain, toes feel like they're broken all are consistent with neuropathy most likely associated with your chemotherapy I recommend that you talked prescriber of your gabapentin and gradually increase the dosage of your gabapentin until the symptoms are reduced to your tolerance Also, ask your oncologist if the neuropathy associated with his chemotherapy is permanent or diminishes over time

## 2014-12-27 ENCOUNTER — Ambulatory Visit (HOSPITAL_BASED_OUTPATIENT_CLINIC_OR_DEPARTMENT_OTHER): Payer: 59

## 2014-12-27 VITALS — BP 119/82 | HR 82 | Temp 97.4°F | Resp 16

## 2014-12-27 DIAGNOSIS — Z452 Encounter for adjustment and management of vascular access device: Secondary | ICD-10-CM

## 2014-12-27 DIAGNOSIS — Z95828 Presence of other vascular implants and grafts: Secondary | ICD-10-CM

## 2014-12-27 DIAGNOSIS — C50411 Malignant neoplasm of upper-outer quadrant of right female breast: Secondary | ICD-10-CM | POA: Diagnosis not present

## 2014-12-27 MED ORDER — HEPARIN SOD (PORK) LOCK FLUSH 100 UNIT/ML IV SOLN
500.0000 [IU] | Freq: Once | INTRAVENOUS | Status: AC
  Administered 2014-12-27: 500 [IU] via INTRAVENOUS
  Filled 2014-12-27: qty 5

## 2014-12-27 MED ORDER — SODIUM CHLORIDE 0.9 % IJ SOLN
10.0000 mL | INTRAMUSCULAR | Status: DC | PRN
  Administered 2014-12-27: 10 mL via INTRAVENOUS
  Filled 2014-12-27: qty 10

## 2014-12-27 NOTE — Patient Instructions (Signed)

## 2014-12-28 ENCOUNTER — Telehealth: Payer: Self-pay | Admitting: Oncology

## 2014-12-28 ENCOUNTER — Other Ambulatory Visit: Payer: Self-pay | Admitting: Oncology

## 2014-12-28 NOTE — Telephone Encounter (Signed)
pof was sent to move patient patient for a chemo patient but there was no need as there was an open spot

## 2015-01-15 ENCOUNTER — Other Ambulatory Visit: Payer: Self-pay | Admitting: Oncology

## 2015-01-19 ENCOUNTER — Ambulatory Visit (HOSPITAL_BASED_OUTPATIENT_CLINIC_OR_DEPARTMENT_OTHER): Payer: 59

## 2015-01-19 ENCOUNTER — Telehealth: Payer: Self-pay | Admitting: Oncology

## 2015-01-19 ENCOUNTER — Encounter: Payer: Self-pay | Admitting: Oncology

## 2015-01-19 ENCOUNTER — Ambulatory Visit (HOSPITAL_BASED_OUTPATIENT_CLINIC_OR_DEPARTMENT_OTHER): Payer: 59 | Admitting: Oncology

## 2015-01-19 VITALS — BP 126/68 | HR 86 | Temp 97.5°F | Resp 16

## 2015-01-19 VITALS — BP 124/63 | HR 82 | Temp 98.5°F | Resp 18 | Ht 67.5 in | Wt 221.9 lb

## 2015-01-19 DIAGNOSIS — C50411 Malignant neoplasm of upper-outer quadrant of right female breast: Secondary | ICD-10-CM

## 2015-01-19 DIAGNOSIS — G622 Polyneuropathy due to other toxic agents: Secondary | ICD-10-CM | POA: Diagnosis not present

## 2015-01-19 DIAGNOSIS — Z853 Personal history of malignant neoplasm of breast: Secondary | ICD-10-CM

## 2015-01-19 DIAGNOSIS — Z1509 Genetic susceptibility to other malignant neoplasm: Secondary | ICD-10-CM

## 2015-01-19 DIAGNOSIS — G620 Drug-induced polyneuropathy: Secondary | ICD-10-CM

## 2015-01-19 DIAGNOSIS — Z1501 Genetic susceptibility to malignant neoplasm of breast: Secondary | ICD-10-CM

## 2015-01-19 DIAGNOSIS — T451X5A Adverse effect of antineoplastic and immunosuppressive drugs, initial encounter: Secondary | ICD-10-CM

## 2015-01-19 DIAGNOSIS — Z95828 Presence of other vascular implants and grafts: Secondary | ICD-10-CM

## 2015-01-19 LAB — COMPREHENSIVE METABOLIC PANEL (CC13)
ALBUMIN: 3.9 g/dL (ref 3.5–5.0)
ALK PHOS: 74 U/L (ref 40–150)
ALT: 15 U/L (ref 0–55)
AST: 16 U/L (ref 5–34)
Anion Gap: 9 mEq/L (ref 3–11)
BUN: 16 mg/dL (ref 7.0–26.0)
CALCIUM: 9.6 mg/dL (ref 8.4–10.4)
CO2: 25 mEq/L (ref 22–29)
Chloride: 107 mEq/L (ref 98–109)
Creatinine: 0.9 mg/dL (ref 0.6–1.1)
EGFR: 89 mL/min/{1.73_m2} — ABNORMAL LOW (ref >90)
Glucose: 114 mg/dl (ref 70–140)
POTASSIUM: 4 meq/L (ref 3.5–5.1)
Sodium: 140 mEq/L (ref 136–145)
Total Bilirubin: 0.68 mg/dL (ref 0.20–1.20)
Total Protein: 7.4 g/dL (ref 6.4–8.3)

## 2015-01-19 LAB — CBC WITH DIFFERENTIAL/PLATELET
BASO%: 0.2 % (ref 0.0–2.0)
BASOS ABS: 0 10*3/uL (ref 0.0–0.1)
EOS ABS: 0.1 10*3/uL (ref 0.0–0.5)
EOS%: 1.6 % (ref 0.0–7.0)
HEMATOCRIT: 36.7 % (ref 34.8–46.6)
HEMOGLOBIN: 11.9 g/dL (ref 11.6–15.9)
LYMPH#: 2.2 10*3/uL (ref 0.9–3.3)
LYMPH%: 37.9 % (ref 14.0–49.7)
MCH: 28.4 pg (ref 25.1–34.0)
MCHC: 32.4 g/dL (ref 31.5–36.0)
MCV: 87.6 fL (ref 79.5–101.0)
MONO#: 0.5 10*3/uL (ref 0.1–0.9)
MONO%: 8.1 % (ref 0.0–14.0)
NEUT#: 3 10*3/uL (ref 1.5–6.5)
NEUT%: 52.2 % (ref 38.4–76.8)
Platelets: 269 10*3/uL (ref 145–400)
RBC: 4.19 10*6/uL (ref 3.70–5.45)
RDW: 13.7 % (ref 11.2–14.5)
WBC: 5.7 10*3/uL (ref 3.9–10.3)

## 2015-01-19 MED ORDER — SODIUM CHLORIDE 0.9 % IJ SOLN
10.0000 mL | INTRAMUSCULAR | Status: DC | PRN
  Administered 2015-01-19: 10 mL via INTRAVENOUS
  Filled 2015-01-19: qty 10

## 2015-01-19 MED ORDER — GABAPENTIN 300 MG PO CAPS
300.0000 mg | ORAL_CAPSULE | Freq: Three times a day (TID) | ORAL | Status: DC
Start: 2015-01-19 — End: 2015-10-22

## 2015-01-19 MED ORDER — HEPARIN SOD (PORK) LOCK FLUSH 100 UNIT/ML IV SOLN
500.0000 [IU] | Freq: Once | INTRAVENOUS | Status: AC
  Administered 2015-01-19: 500 [IU] via INTRAVENOUS
  Filled 2015-01-19: qty 5

## 2015-01-19 NOTE — Progress Notes (Signed)
OFFICE PROGRESS NOTE   January 21, 2015   Physicians:F.Barry Dienes, D.Harlow Mares, K.Winifred Olive (GYN), S.Squire, M.Johnson  INTERVAL HISTORY:  Patient is seen, alone for visit, in scheduled follow up of bilateral breast cancer, most recently triple negative node negative on right 10-2013. She has been on observation since completing adjuvant chemotherapy 04-2014. She has multiple BRCA 1 and 2 variants. She has had bilateral mastectomies and hysterectomy BSO.   Patient felt well, other than residual chemo neuropathy in feet, until respiratory infection shortly after Thanksgiving, with numerous people also sick at work. She had laryngitis, frequent cough, clear rhinorrhea, some mild nausea and pain in head with coughing; she has had no fever and no SOB. Respiratory symptoms seem to be improving. No GI symptoms now. No new or different pain. No bleeding. Energy and appetite good until respiratory illness.. No problems with PAC. Remainder of 10 point Review of systems negative.  She saw podiatrist, no recommendations other than continue gabapentin. She has used the gabapentin 300 mg AM "so I can wear shoes to work" and 200 mg hs, which does help neuropathy symptoms in feet, does not make her drowsy and does not last full 12 hours. Will increase to 300 mg tid.  PAC in, flushed today. This has been difficult to flush at times, nursing recommending q 6 wks in preference to q 8 weeks. Multiple BRCA 1 and 2 variants Left breast cancer 1996 treated with chemo including high dose with autologous BMT. Hysterectomy with oophorectomy 2011 Flu vaccine 11-17-14  ONCOLOGIC HISTORY History is of left breast cancer in June 1996, when she was [redacted] weeks pregnant. This was 4x4x2.5 cm poorly differentiated invasive ductal and comedo carciinoma, ER PR negative, HER 2 not tested in 1996. She was treated with 2 cycles of adriamycin at 60 mg/m2 in [place] as she completed the pregnancy, then had left mastectomy with 12 of  23 left axillary nodes positive. She had additional adriamycin and cytoxan in [place] to total adriamycin dose of 300 mg/m2, followed by high dose chemotherapy with cytoxan, CDDP and autologous transplant on protocol at [place] (Dr J.Vredenburgh) , and local radiation on left. We have received treatment records from [facility] for high dose chemotherapy and autologous transplant done 1996, which I have reviewed and which will be scanned into this EMR. She had left TRAM reconstruction. She had multiple BRCA 1 and 2 variants at last genetics testing. She had hysterectomy with oophorectomy ~ 08-2009, but was reluctant to proceed with prophylactic right mastectomy. She was followed with mammograms + yearly MRIs due to BRCA abnormalities. Daughter [name] is healthy, age 78.  Right breast cancer was identified on tomo mammography at [facility] 09-24-13, with heterogeneously dense breast tissue and suspicious area upper outer quadrant. She had MRI 09-27-13 with 9x5x6 mm mass upper outer quadrant on right, otherwise unremarkable bilaterally. US biopsy at [facility] 10-04-13 [phone]) documented invasive mammary carcinoma ER PR and HER 2 negative and proliferation fraction 87%. After consultation with general surgery, plastic surgery, medical oncology and radiation oncology, she agreed to right mastectomy. She had staging CT CAP 10-13-13, unremarkable other than 5 mm low density lesion in dome of right hepatic lobe likely cyst and scarring left lung apex likely from prior RT.She had right mastectomy with sentinel node evaluation and immediate reconstruction with right latissimus myocutaneous flap + saline implant by Drs Barry Dienes and Harlow Mares on 11-16-13. [place] [facility] [phone] from 11-16-13 had grade 3 invasive ductal carcinoma 0.6 cm with 3 sentinel axillary nodes +  1 nonsentinel node negative, closest margin 1 cm, no LVSI, repeat ER and PR testing both 0% and not sufficient tumor for repeat HER 2 (which had been  negative on diagnostic biopsy 09-30-13 [phone]). She had first carboplatin + taxotere on 12-28-13, with neulasta day 2. She completed cycle 4 carbo taxotere on 03-01-14. First weekly taxol 03-22-14, planned x 12 weeks. Weeks 7 and 8 held due to progressive neuropathy hands and feet, with some improvement following that break. Taxol resumed on 05-17-14, with worsening of peripheral neuropathy. Total of 7 taxol treatments given.    Objective:  Vital signs in last 24 hours:  BP 124/63 mmHg  Pulse 82  Temp(Src) 98.5 F (36.9 C) (Oral)  Resp 18  Ht 5' 7.5" (1.715 m)  Wt 221 lb 14.4 oz (100.653 kg)  BMI 34.22 kg/m2  SpO2 100% Weight up 7 lbs Alert, oriented and appropriate. Ambulatory without difficulty. No cough, no SOB, sounds minimally nasally congested, does not appear ill  HEENT:PERRL, sclerae not icteric. Oral mucosa moist without lesions, posterior pharynx minimal dull erythema no exudate. Nasal turbinates boggy.   No JVD.  Lymphatics:no cervical,surpaclavicular, axillary adenopathy Resp: clear to auscultation bilaterally and normal percussion bilaterally Cardio: regular rate and rhythm. No gallop. GI: soft, nontender, not distended, no mass or organomegaly. Normally active bowel sounds. Surgical incision not remarkable. Musculoskeletal/ Extremities: without pitting edema, cords, tenderness Neuro: peripheral neuropathy distal feet bilaterally. Otherwise nonfocal. PSYCH appropriate mood and affect Skin without rash, ecchymosis, petechiae Breasts: bilateral reconstructions without evidence of local recurrence, asymmetrical. Axillae benign. Portacath-without erythema or tenderness, flushed with blood return today  Lab Results:  Results for orders placed or performed in visit on 01/19/15  CBC with Differential  Result Value Ref Range   WBC 5.7 3.9 - 10.3 10e3/uL   NEUT# 3.0 1.5 - 6.5 10e3/uL   HGB 11.9 11.6 - 15.9 g/dL   HCT 36.7 34.8 - 46.6 %   Platelets 269 145 - 400 10e3/uL    MCV 87.6 79.5 - 101.0 fL   MCH 28.4 25.1 - 34.0 pg   MCHC 32.4 31.5 - 36.0 g/dL   RBC 4.19 3.70 - 5.45 10e6/uL   RDW 13.7 11.2 - 14.5 %   lymph# 2.2 0.9 - 3.3 10e3/uL   MONO# 0.5 0.1 - 0.9 10e3/uL   Eosinophils Absolute 0.1 0.0 - 0.5 10e3/uL   Basophils Absolute 0.0 0.0 - 0.1 10e3/uL   NEUT% 52.2 38.4 - 76.8 %   LYMPH% 37.9 14.0 - 49.7 %   MONO% 8.1 0.0 - 14.0 %   EOS% 1.6 0.0 - 7.0 %   BASO% 0.2 0.0 - 2.0 %  Comprehensive metabolic panel (Cmet) - [facility]  Result Value Ref Range   Sodium 140 136 - 145 mEq/L   Potassium 4.0 3.5 - 5.1 mEq/L   Chloride 107 98 - 109 mEq/L   CO2 25 22 - 29 mEq/L   Glucose 114 70 - 140 mg/dl   BUN 16.0 7.0 - 26.0 mg/dL   Creatinine 0.9 0.6 - 1.1 mg/dL   Total Bilirubin 0.68 0.20 - 1.20 mg/dL   Alkaline Phosphatase 74 40 - 150 U/L   AST 16 5 - 34 U/L   ALT 15 0 - 55 U/L   Total Protein 7.4 6.4 - 8.3 g/dL   Albumin 3.9 3.5 - 5.0 g/dL   Calcium 9.6 8.4 - 10.4 mg/dL   Anion Gap 9 3 - 11 mEq/L   EGFR 89 (L) >90 ml/min/1.73 m2  Studies/Results:  No results found.  Medications: I have reviewed the patient's current medications. Fine for claritin or OTC for respiratory symptoms now, no indication for antibiotics. Increase gabapentin to 300 mg tid   DISCUSSION: will flush PAC every 6 weeks for now; if other problems may consider removing this, tho she has had bilateral axillary node dissections (23 nodes on left and total 4 nodes on right) so IV access in UE not ideal. Increase gabapentin as above. At some point may still be worthwhile to evaluate for orthotics, which podiatrist did not address  She is still considering revision of the breast reconstruction due to asymmetry, not as marked as prior to right mastectomy but very obvious   Assessment/Plan:   1.Triple negative right breast cancer 0.6 cm, 4 nodes negative: post right mastectomy with axillary node evaluation and immediate reconstruction with latissimus flap 11-16-13. Adjuvant chemo  with 4 cycles carbo taxotere 12-28-13 thru 03-01-14, then weekly taxol x7 from 03-22-14 thru 3-754-3, complicated by significant peripheral neuropathy such that remainder of planned 12 cycles not given. Clinically doing well from standpoint of the breast cancer. I will see her in ~ 3 months with labs, coordinating with PAC flush 2.left breast cancer 1996: 4 cm primary, 12 of 23 nodes involved, ER PR negative (HER 2 not tested in 1996), diagnosed when 7 months pregnant, treated with adriamycin prior to delivery, then [facility] to total adria dose 300 mg/m2, then high dose therapy with cytoxan, CDDP, carmustine and autologous BMT at [facility], then RT. No known recurrent disease. Left TRAM reconstruction.  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 Increase gabapentin to 300 mg tic, continue B complex. Podiatry offered no other interventions 5.zoster shortly after completion of treatment 1996 6.flu vaccine 11-17-14 7.hysterectomy with oophorectomy 2011 8. PAC in, placed by Dr Barry Dienes. If not functional would be ok to DC, otherwise with limited access bilateral UE, still seems reasonable to keep for now. Try flushes every 6 weeks 9.right chest pain muscle spasm not bothersome now: has prn muscle relaxants 10.Marland Kitchenanemia related to chemo etc resolved. 11.respiratory infection with laryngitis and bronchitis: improving, similar illness in several coworkers, OTC meds ok, call if worse.  All questions answered and she knows to call prior to next scheduled visit if needed. Time spent 25 min including >50% counseling and coordination of care.    Candiace West P, MD   01/21/2015, 1:19 PM

## 2015-01-19 NOTE — Telephone Encounter (Signed)
Gave patient avs report and appointments for January and February  °

## 2015-03-02 ENCOUNTER — Ambulatory Visit (HOSPITAL_BASED_OUTPATIENT_CLINIC_OR_DEPARTMENT_OTHER): Payer: 59

## 2015-03-02 VITALS — BP 123/74 | HR 78 | Temp 98.2°F | Resp 18

## 2015-03-02 DIAGNOSIS — Z452 Encounter for adjustment and management of vascular access device: Secondary | ICD-10-CM

## 2015-03-02 DIAGNOSIS — Z95828 Presence of other vascular implants and grafts: Secondary | ICD-10-CM

## 2015-03-02 DIAGNOSIS — C50411 Malignant neoplasm of upper-outer quadrant of right female breast: Secondary | ICD-10-CM | POA: Diagnosis not present

## 2015-03-02 MED ORDER — HEPARIN SOD (PORK) LOCK FLUSH 100 UNIT/ML IV SOLN
500.0000 [IU] | Freq: Once | INTRAVENOUS | Status: AC
  Administered 2015-03-02: 500 [IU] via INTRAVENOUS
  Filled 2015-03-02: qty 5

## 2015-03-02 MED ORDER — SODIUM CHLORIDE 0.9 % IJ SOLN
10.0000 mL | INTRAMUSCULAR | Status: DC | PRN
  Administered 2015-03-02: 10 mL via INTRAVENOUS
  Filled 2015-03-02: qty 10

## 2015-03-02 NOTE — Patient Instructions (Signed)

## 2015-04-09 ENCOUNTER — Other Ambulatory Visit: Payer: Self-pay | Admitting: Oncology

## 2015-04-10 ENCOUNTER — Telehealth: Payer: Self-pay | Admitting: Oncology

## 2015-04-10 NOTE — Telephone Encounter (Signed)
Per 2/19 pof canceled labs for 2/23 visit.

## 2015-04-13 ENCOUNTER — Encounter: Payer: Self-pay | Admitting: Oncology

## 2015-04-13 ENCOUNTER — Ambulatory Visit (HOSPITAL_BASED_OUTPATIENT_CLINIC_OR_DEPARTMENT_OTHER): Payer: 59 | Admitting: Oncology

## 2015-04-13 ENCOUNTER — Ambulatory Visit (HOSPITAL_BASED_OUTPATIENT_CLINIC_OR_DEPARTMENT_OTHER): Payer: 59

## 2015-04-13 ENCOUNTER — Other Ambulatory Visit: Payer: 59

## 2015-04-13 ENCOUNTER — Ambulatory Visit: Payer: 59

## 2015-04-13 VITALS — BP 121/64 | HR 65 | Temp 97.8°F | Resp 18 | Ht 67.5 in | Wt 228.9 lb

## 2015-04-13 DIAGNOSIS — Z95828 Presence of other vascular implants and grafts: Secondary | ICD-10-CM

## 2015-04-13 DIAGNOSIS — M542 Cervicalgia: Secondary | ICD-10-CM

## 2015-04-13 DIAGNOSIS — R635 Abnormal weight gain: Secondary | ICD-10-CM

## 2015-04-13 DIAGNOSIS — G620 Drug-induced polyneuropathy: Secondary | ICD-10-CM

## 2015-04-13 DIAGNOSIS — C50411 Malignant neoplasm of upper-outer quadrant of right female breast: Secondary | ICD-10-CM

## 2015-04-13 DIAGNOSIS — Z171 Estrogen receptor negative status [ER-]: Secondary | ICD-10-CM | POA: Diagnosis not present

## 2015-04-13 DIAGNOSIS — Z452 Encounter for adjustment and management of vascular access device: Secondary | ICD-10-CM | POA: Diagnosis not present

## 2015-04-13 DIAGNOSIS — G622 Polyneuropathy due to other toxic agents: Secondary | ICD-10-CM | POA: Diagnosis not present

## 2015-04-13 DIAGNOSIS — T451X5A Adverse effect of antineoplastic and immunosuppressive drugs, initial encounter: Secondary | ICD-10-CM

## 2015-04-13 DIAGNOSIS — Z1509 Genetic susceptibility to other malignant neoplasm: Secondary | ICD-10-CM

## 2015-04-13 DIAGNOSIS — Z1501 Genetic susceptibility to malignant neoplasm of breast: Secondary | ICD-10-CM

## 2015-04-13 DIAGNOSIS — C50912 Malignant neoplasm of unspecified site of left female breast: Secondary | ICD-10-CM

## 2015-04-13 DIAGNOSIS — E669 Obesity, unspecified: Secondary | ICD-10-CM

## 2015-04-13 DIAGNOSIS — Z853 Personal history of malignant neoplasm of breast: Secondary | ICD-10-CM

## 2015-04-13 MED ORDER — ALTEPLASE 2 MG IJ SOLR
2.0000 mg | Freq: Once | INTRAMUSCULAR | Status: AC | PRN
  Administered 2015-04-13: 2 mg
  Filled 2015-04-13: qty 2

## 2015-04-13 MED ORDER — SODIUM CHLORIDE 0.9% FLUSH
10.0000 mL | INTRAVENOUS | Status: DC | PRN
  Administered 2015-04-13: 10 mL via INTRAVENOUS
  Filled 2015-04-13: qty 10

## 2015-04-13 MED ORDER — HEPARIN SOD (PORK) LOCK FLUSH 100 UNIT/ML IV SOLN
500.0000 [IU] | Freq: Once | INTRAVENOUS | Status: AC
  Administered 2015-04-13: 500 [IU] via INTRAVENOUS
  Filled 2015-04-13: qty 5

## 2015-04-13 NOTE — Progress Notes (Signed)
83ml of blood aspirated from [facility] to remove the Cath flo tpa.  Blood flow sufficient and patient reports no discomfort.

## 2015-04-13 NOTE — Progress Notes (Signed)
OFFICE PROGRESS NOTE   April 13, 2015   Physicians: F.Barry Dienes, D.Harlow Mares, K.Winifred Olive (GYN), S.Squire, M.Johnson  INTERVAL HISTORY:  Patient is seen, alone for visit, in follow up of bilateral breast cancer, most recently triple negative on right 10-2013. She has multiple BRCA 1 and 2 variants; she has had bilateral mastectomies and hysterectomy BSO. PAC kept due to limited UE access, see below.  Patient has noticed recent shooting pain on one side of neck laterally or other side, extends up to side of head, happens when she flexes neck down as to look under arm in shower. Discomfort resolves when she stretches neck away from this position. She denies any constant pain including in C spine.  She has begun diet and exercise program thru her insurance, with portion control, coaching assistance, increased exercise (treadmill x 15 min daily at work now) for past 2 weeks.  She continues gabapentin for residual chemotherapy neuropathy, using this bid with an extra dose if needed (did not increase to scheduled tid as we had discussed at last visit). She has otherwise felt well, with no other pain, good appetite and improved energy, no SOB or other respiratory symptoms, no abdominal or pelvic discomfort, bowels ok, no bleeding, no LE swelling.  Remainder of 10 point Review of Systems negative.   PAC in, flushed today. This has been difficult to flush at times including today, nursing recommending q 6 wks in preference to q 8 weeks. Multiple BRCA 1 and 2 variants Left breast cancer 1996 treated with chemo including high dose with autologous BMT. Hysterectomy with oophorectomy 2011 Flu vaccine 11-17-14  Has business meeting in [place] in May, [place] to go with her.  ONCOLOGIC HISTORY History is of left breast cancer in June 1996, when she was [redacted] weeks pregnant. This was 4x4x2.5 cm poorly differentiated invasive ductal and comedo carciinoma, ER PR negative, HER 2 not tested in 1996. She was  treated with 2 cycles of adriamycin at 60 mg/m2 in [place] as she completed the pregnancy, then had left mastectomy with 12 of 23 left axillary nodes positive. She had additional adriamycin and cytoxan in [place] to total adriamycin dose of 300 mg/m2, followed by high dose chemotherapy with cytoxan, CDDP and autologous transplant on protocol at [facility] (Dr J.Vredenburgh) , and local radiation on left. We have received treatment records from [facility] for high dose chemotherapy and autologous transplant done 1996, which I have reviewed and which will be scanned into this EMR. She had left TRAM reconstruction. She had multiple BRCA 1 and 2 variants at last genetics testing. She had hysterectomy with oophorectomy ~ 08-2009, but was reluctant to proceed with prophylactic right mastectomy. She was followed with mammograms + yearly MRIs due to BRCA abnormalities. Daughter [name] is healthy, age 82.  Right breast cancer was identified on tomo mammography at [facility] 09-24-13, with heterogeneously dense breast tissue and suspicious area upper outer quadrant. She had MRI 09-27-13 with 9x5x6 mm mass upper outer quadrant on right, otherwise unremarkable bilaterally. US biopsy at [facility] 10-04-13 [phone]) documented invasive mammary carcinoma ER PR and HER 2 negative and proliferation fraction 87%. After consultation with general surgery, plastic surgery, medical oncology and radiation oncology, she agreed to right mastectomy. She had staging CT CAP 10-13-13, unremarkable other than 5 mm low density lesion in dome of right hepatic lobe likely cyst and scarring left lung apex likely from prior RT.She had right mastectomy with sentinel node evaluation and immediate reconstruction with right latissimus myocutaneous flap +  saline implant by Drs Barry Dienes and Harlow Mares on 11-16-13. [facility] [phone] from 11-16-13 had grade 3 invasive ductal carcinoma 0.6 cm with 3 sentinel axillary nodes + 1 nonsentinel node  negative, closest margin 1 cm, no LVSI, repeat ER and PR testing both 0% and not sufficient tumor for repeat HER 2 (which had been negative on diagnostic biopsy 09-30-13 [phone]). She had first carboplatin + taxotere on 12-28-13, with neulasta day 2. She completed cycle 4 carbo taxotere on 03-01-14. First weekly taxol 03-22-14, planned x 12 weeks. Weeks 7 and 8 held due to progressive neuropathy hands and feet, with some improvement following that break. Taxol resumed on 05-17-14, with worsening of peripheral neuropathy. Total of 7 taxol treatments given.    Objective:  Vital signs in last 24 hours:  BP 121/64 mmHg  Pulse 65  Temp(Src) 97.8 F (36.6 C) (Oral)  Resp 18  Ht 5' 7.5" (1.715 m)  Wt 228 lb 14.4 oz (103.828 kg)  BMI 35.30 kg/m2  SpO2 100% Weight up 6 lbs Appears comfortable, easily mobile, respirations not labored.  Alert, oriented and appropriate, talkative and very pleasant as always. Ambulatory without difficulty.  HEENT:PERRL, sclerae not icteric. Oral mucosa moist without lesions, posterior pharynx clear.  Neck supple. No JVD.  Lymphatics:no cervical,supraclavicular, axillary or inguinal adenopathy Resp: clear to auscultation bilaterally and normal percussion bilaterally Cardio: regular rate and rhythm. No gallop. GI: soft, nontender, not distended, no mass or organomegaly. Normally active bowel sounds. Surgical incision not remarkable. Musculoskeletal/ Extremities: C spine, T spine not tender to palpation. UE/ LE without pitting edema, cords, tenderness Neuro: no change peripheral neuropathy feet and full length of fingers bilaterally. Otherwise nonfocal Skin without rash, ecchymosis, petechiae Breasts: bilateral reconstructions without findings of concern for local recurrence. Axillae benign. Portacath-without erythema or tenderness, did not draw well on initial attempt today  Lab Results:  Results for orders placed or performed in visit on 01/19/15  CBC with  Differential  Result Value Ref Range   WBC 5.7 3.9 - 10.3 10e3/uL   NEUT# 3.0 1.5 - 6.5 10e3/uL   HGB 11.9 11.6 - 15.9 g/dL   HCT 36.7 34.8 - 46.6 %   Platelets 269 145 - 400 10e3/uL   MCV 87.6 79.5 - 101.0 fL   MCH 28.4 25.1 - 34.0 pg   MCHC 32.4 31.5 - 36.0 g/dL   RBC 4.19 3.70 - 5.45 10e6/uL   RDW 13.7 11.2 - 14.5 %   lymph# 2.2 0.9 - 3.3 10e3/uL   MONO# 0.5 0.1 - 0.9 10e3/uL   Eosinophils Absolute 0.1 0.0 - 0.5 10e3/uL   Basophils Absolute 0.0 0.0 - 0.1 10e3/uL   NEUT% 52.2 38.4 - 76.8 %   LYMPH% 37.9 14.0 - 49.7 %   MONO% 8.1 0.0 - 14.0 %   EOS% 1.6 0.0 - 7.0 %   BASO% 0.2 0.0 - 2.0 %  Comprehensive metabolic panel (Cmet) - [facility]  Result Value Ref Range   Sodium 140 136 - 145 mEq/L   Potassium 4.0 3.5 - 5.1 mEq/L   Chloride 107 98 - 109 mEq/L   CO2 25 22 - 29 mEq/L   Glucose 114 70 - 140 mg/dl   BUN 16.0 7.0 - 26.0 mg/dL   Creatinine 0.9 0.6 - 1.1 mg/dL   Total Bilirubin 0.68 0.20 - 1.20 mg/dL   Alkaline Phosphatase 74 40 - 150 U/L   AST 16 5 - 34 U/L   ALT 15 0 - 55 U/L   Total Protein  7.4 6.4 - 8.3 g/dL   Albumin 3.9 3.5 - 5.0 g/dL   Calcium 9.6 8.4 - 10.4 mg/dL   Anion Gap 9 3 - 11 mEq/L   EGFR 89 (L) >90 ml/min/1.73 m2     Studies/Results:  No results found.  Last bone density normal in 2012; note surgical menopause age 72   Medications: I have reviewed the patient's current medications.  DISCUSSION  May have spurring or other degenerative changes in neck causing positional discomfort, will try to do gentle stretching and be aware of posture. She will let MD know if any increase in symptoms. Last bone density scan 2012 noted after visit.   Encouraged her to continue weight loss efforts using [facility] program Continue gabapentin  Discussed removing PAC. She very much prefers to keep this due to limited access bilateral UE (23 axillary node dissection and radiation on left; 4 nodes without radiation on right). Blood flow noted adequate after TPA to [facility]  today.   Assessment/Plan:   1.Triple negative right breast cancer 0.6 cm, 4 nodes negative: post right mastectomy with axillary node evaluation and immediate reconstruction with latissimus flap 11-16-13. Adjuvant chemo with 4 cycles carbo taxotere 12-28-13 thru 03-01-14, then weekly taxol x7 from 03-22-14 thru 7-616-0, complicated by significant peripheral neuropathy such that remainder of planned 12 cycles not given. Clinically doing well from standpoint of the breast cancer. I will see her in ~ 4 months with labs, coordinating with PAC flush 2.left breast cancer 1996: 4 cm primary, 12 of 23 nodes involved, ER PR negative (HER 2 not tested in 1996), diagnosed when 7 months pregnant, treated with adriamycin prior to delivery, then [facility] to total adria dose 300 mg/m2, then high dose therapy with cytoxan, CDDP, carmustine and autologous BMT at [facility], then RT. No known recurrent disease. Left TRAM reconstruction.  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  Gabapentin helpful for symptoms, continue B complex. Podiatry offered no other interventions 5.zoster shortly after completion of treatment 1996 6.flu vaccine 11-17-14 7.hysterectomy with oophorectomy 2011 8. PAC in, placed by Dr Barry Dienes. If not functional would be ok to DC, otherwise with limited access bilateral UE, still seems reasonable to keep for now. Try flushes every 6 weeks 9.right chest pain muscle spasm not bothersome now: has prn muscle relaxants 10. Bilateral positional neck discomfort as above. 11.Progressive weight gain, BMI 35.4. Encouraged her to address with the diet and exercise assistance program that she began early 03-2015. 12.last bone density scan in this EMR 2012. Would consider repeat particularly given premature surgical menopause  All questions answered and patient is in agreement with recommendations and plans. Time spent 25 min including >50% counseling  and coordination of care.   Onofre Gains P, MD   04/13/2015, 10:26 AM

## 2015-04-13 NOTE — Patient Instructions (Signed)

## 2015-04-15 DIAGNOSIS — Z95828 Presence of other vascular implants and grafts: Secondary | ICD-10-CM | POA: Insufficient documentation

## 2015-04-15 DIAGNOSIS — E669 Obesity, unspecified: Secondary | ICD-10-CM | POA: Insufficient documentation

## 2015-04-17 ENCOUNTER — Telehealth: Payer: Self-pay | Admitting: Oncology

## 2015-04-17 NOTE — Telephone Encounter (Signed)
S/w pt, gave appts 4/6, 5/18, + 6/29.

## 2015-04-19 ENCOUNTER — Other Ambulatory Visit: Payer: Self-pay | Admitting: Gastroenterology

## 2015-05-25 ENCOUNTER — Ambulatory Visit (HOSPITAL_BASED_OUTPATIENT_CLINIC_OR_DEPARTMENT_OTHER): Payer: 59

## 2015-05-25 ENCOUNTER — Encounter (HOSPITAL_COMMUNITY): Payer: Self-pay | Admitting: *Deleted

## 2015-05-25 VITALS — BP 119/60 | HR 77 | Temp 98.4°F | Resp 17

## 2015-05-25 DIAGNOSIS — Z452 Encounter for adjustment and management of vascular access device: Secondary | ICD-10-CM

## 2015-05-25 DIAGNOSIS — C50411 Malignant neoplasm of upper-outer quadrant of right female breast: Secondary | ICD-10-CM | POA: Diagnosis not present

## 2015-05-25 DIAGNOSIS — Z95828 Presence of other vascular implants and grafts: Secondary | ICD-10-CM

## 2015-05-25 MED ORDER — HEPARIN SOD (PORK) LOCK FLUSH 100 UNIT/ML IV SOLN
500.0000 [IU] | Freq: Once | INTRAVENOUS | Status: AC
  Administered 2015-05-25: 500 [IU] via INTRAVENOUS
  Filled 2015-05-25: qty 5

## 2015-05-25 MED ORDER — SODIUM CHLORIDE 0.9% FLUSH
10.0000 mL | INTRAVENOUS | Status: DC | PRN
  Administered 2015-05-25: 10 mL via INTRAVENOUS
  Filled 2015-05-25: qty 10

## 2015-05-25 NOTE — Patient Instructions (Signed)

## 2015-06-05 ENCOUNTER — Ambulatory Visit (HOSPITAL_COMMUNITY): Payer: 59 | Admitting: Anesthesiology

## 2015-06-05 ENCOUNTER — Ambulatory Visit (HOSPITAL_COMMUNITY)
Admission: RE | Admit: 2015-06-05 | Discharge: 2015-06-05 | Disposition: A | Discharge status: Home / Self Care | Payer: 59 | Source: Ambulatory Visit | Attending: Gastroenterology | Admitting: Gastroenterology

## 2015-06-05 ENCOUNTER — Encounter (HOSPITAL_COMMUNITY)
Admission: RE | Disposition: A | Discharge status: Home / Self Care | Payer: Self-pay | Source: Ambulatory Visit | Attending: Gastroenterology

## 2015-06-05 ENCOUNTER — Encounter (HOSPITAL_COMMUNITY): Payer: Self-pay

## 2015-06-05 DIAGNOSIS — Z87891 Personal history of nicotine dependence: Secondary | ICD-10-CM | POA: Diagnosis not present

## 2015-06-05 DIAGNOSIS — Z1211 Encounter for screening for malignant neoplasm of colon: Secondary | ICD-10-CM | POA: Insufficient documentation

## 2015-06-05 DIAGNOSIS — F419 Anxiety disorder, unspecified: Secondary | ICD-10-CM | POA: Insufficient documentation

## 2015-06-05 DIAGNOSIS — D122 Benign neoplasm of ascending colon: Secondary | ICD-10-CM | POA: Insufficient documentation

## 2015-06-05 DIAGNOSIS — Z853 Personal history of malignant neoplasm of breast: Secondary | ICD-10-CM | POA: Diagnosis not present

## 2015-06-05 HISTORY — DX: Allergy status to unspecified drugs, medicaments and biological substances: Z88.9

## 2015-06-05 HISTORY — PX: COLONOSCOPY WITH PROPOFOL: SHX5780

## 2015-06-05 SURGERY — COLONOSCOPY WITH PROPOFOL
Anesthesia: Monitor Anesthesia Care

## 2015-06-05 MED ORDER — PROPOFOL 500 MG/50ML IV EMUL
INTRAVENOUS | Status: DC | PRN
  Administered 2015-06-05: 250 ug/kg/min via INTRAVENOUS

## 2015-06-05 MED ORDER — LIDOCAINE HCL (CARDIAC) 20 MG/ML IV SOLN
INTRAVENOUS | Status: DC | PRN
  Administered 2015-06-05: 60 mg via INTRAVENOUS

## 2015-06-05 MED ORDER — LACTATED RINGERS IV SOLN
INTRAVENOUS | Status: DC
  Administered 2015-06-05: 11:00:00 via INTRAVENOUS

## 2015-06-05 MED ORDER — ONDANSETRON HCL 4 MG/2ML IJ SOLN
INTRAMUSCULAR | Status: AC
  Filled 2015-06-05: qty 2

## 2015-06-05 MED ORDER — SODIUM CHLORIDE 0.9 % IV SOLN
INTRAVENOUS | Status: DC
Start: 2015-06-05 — End: 2015-06-05

## 2015-06-05 MED ORDER — PROPOFOL 10 MG/ML IV BOLUS
INTRAVENOUS | Status: AC
  Filled 2015-06-05: qty 20

## 2015-06-05 MED ORDER — ONDANSETRON HCL 4 MG/2ML IJ SOLN
INTRAMUSCULAR | Status: DC | PRN
  Administered 2015-06-05 (×2): 2 mg via INTRAVENOUS

## 2015-06-05 MED ORDER — PROPOFOL 10 MG/ML IV BOLUS
INTRAVENOUS | Status: DC | PRN
  Administered 2015-06-05: 20 mg via INTRAVENOUS
  Administered 2015-06-05: 60 mg via INTRAVENOUS
  Administered 2015-06-05: 30 mg via INTRAVENOUS

## 2015-06-05 MED ORDER — HEPARIN SOD (PORK) LOCK FLUSH 100 UNIT/ML IV SOLN
250.0000 [IU] | INTRAVENOUS | Status: AC | PRN
  Administered 2015-06-05: 500 [IU]

## 2015-06-05 SURGICAL SUPPLY — 21 items

## 2015-06-05 NOTE — Anesthesia Preprocedure Evaluation (Signed)
Anesthesia Evaluation  Patient identified by MRN, date of birth, ID band Patient awake    Reviewed: Allergy & Precautions, H&P , NPO status , Patient's Chart, lab work & pertinent test results  History of Anesthesia Complications (+) PONV  Airway Mallampati: II  TM Distance: >3 FB Neck ROM: full    Dental no notable dental hx. (+) Dental Advisory Given, Teeth Intact   Pulmonary neg pulmonary ROS, former smoker,    Pulmonary exam normal breath sounds clear to auscultation       Cardiovascular Exercise Tolerance: Good negative cardio ROS Normal cardiovascular exam Rhythm:regular Rate:Normal     Neuro/Psych Anxiety Panic attacksnegative neurological ROS  negative psych ROS   GI/Hepatic negative GI ROS, Neg liver ROS,   Endo/Other  negative endocrine ROS  Renal/GU negative Renal ROS  negative genitourinary   Musculoskeletal   Abdominal   Peds  Hematology negative hematology ROS (+)   Anesthesia Other Findings Breast cancer  Reproductive/Obstetrics negative OB ROS                             Anesthesia Physical Anesthesia Plan  ASA: III  Anesthesia Plan: MAC   Post-op Pain Management:    Induction:   Airway Management Planned:   Additional Equipment:   Intra-op Plan:   Post-operative Plan:   Informed Consent: I have reviewed the patients History and Physical, chart, labs and discussed the procedure including the risks, benefits and alternatives for the proposed anesthesia with the patient or authorized representative who has indicated his/her understanding and acceptance.   Dental Advisory Given  Plan Discussed with: CRNA  Anesthesia Plan Comments:         Anesthesia Quick Evaluation

## 2015-06-05 NOTE — Op Note (Signed)
[facility] Patient Name: Heidi Sexton Procedure Date: 06/05/2015 MRN: [id number] Attending MD: Garlan Fair , MD Date of Birth: 11/01/1960 CSN:  Age: 55 Admit Type: Outpatient Procedure:                Colonoscopy Indications:              Screening for colorectal malignant neoplasm Providers:                Garlan Fair, MD, Laverta Baltimore, RN, Despina Pole, Technician, Ralene Bathe, Technician,                            Noel Christmas Referring MD:              Medicines:                Propofol per Anesthesia Complications:            No immediate complications. Estimated Blood Loss:     Estimated blood loss: none. Procedure:                Pre-Anesthesia Assessment:                           - Prior to the procedure, a History and Physical                            was performed, and patient medications and                            allergies were reviewed. The patient's tolerance of                            previous anesthesia was also reviewed. The risks                            and benefits of the procedure and the sedation                            options and risks were discussed with the patient.                            All questions were answered, and informed consent                            was obtained. Prior Anticoagulants: The patient has                            taken no previous anticoagulant or antiplatelet                            agents. ASA Grade Assessment: III - A patient with  severe systemic disease. After reviewing the risks                            and benefits, the patient was deemed in                            satisfactory condition to undergo the procedure.                           After obtaining informed consent, the colonoscope                            was passed under direct vision. Throughout the                            procedure, the  patient's blood pressure, pulse, and                            oxygen saturations were monitored continuously. The                            [id number] scope was introduced through                            the anus and advanced to the the cecum, identified                            by appendiceal orifice and ileocecal valve. The                            colonoscopy was performed without difficulty. The                            patient tolerated the procedure well. The quality                            of the bowel preparation was good. The appendiceal                            orifice and the rectum were photographed. Scope In: 10:58:10 AM Scope Out: 11:18:27 AM Scope Withdrawal Time: 0 hours 14 minutes 29 seconds  Total Procedure Duration: 0 hours 20 minutes 17 seconds  Findings:      The perianal and digital rectal examinations were normal.      A 3 mm polyp was found in the proximal ascending colon. The polyp was       sessile. The polyp was removed with a cold biopsy forceps. Resection and       retrieval were complete.      A 3 mm polyp was found in the mid ascending colon. The polyp was       sessile. The polyp was removed with a cold biopsy forceps. Resection and       retrieval were complete.      A 3 mm polyp was found in the rectum. The polyp was sessile. The polyp  was removed with a cold biopsy forceps. Resection and retrieval were       complete.      A 2 mm polyp was found in the rectum. The polyp was sessile. The polyp       was removed with a cold biopsy forceps. Resection and retrieval were       complete.      The exam was otherwise without abnormality. Impression:               - One 3 mm polyp in the proximal ascending colon,                            removed with a cold biopsy forceps. Resected and                            retrieved.                           - One 3 mm polyp in the mid ascending colon,                             removed with a cold biopsy forceps. Resected and                            retrieved.                           - One 3 mm polyp in the rectum, removed with a cold                            biopsy forceps. Resected and retrieved.                           - One 2 mm polyp in the rectum, removed with a cold                            biopsy forceps. Resected and retrieved.                           - The examination was otherwise normal. Moderate Sedation:      N/A- Per Anesthesia Care Recommendation:           - Patient has a contact number available for                            emergencies. The signs and symptoms of potential                            delayed complications were discussed with the                            patient. Return to normal activities tomorrow.                            Written discharge instructions were provided to the  patient.                           - Await pathology results.                           - Resume previous diet.                           - Continue present medications.                           - Repeat colonoscopy date to be determined after                            pending pathology results are reviewed for                            surveillance. Procedure Code(s):        --- Professional ---                           [phone], Colonoscopy, flexible; with biopsy, single                            or multiple Diagnosis Code(s):        --- Professional ---                           Z12.11, Encounter for screening for malignant                            neoplasm of colon                           D12.2, Benign neoplasm of ascending colon                           K62.1, Rectal polyp CPT copyright 2016 American Medical Association. All rights reserved. The codes documented in this report are preliminary and upon coder review may  be revised to meet current compliance requirements. Earle Gell, MD Garlan Fair, MD 06/05/2015 11:26:37 AM This report has been signed electronically. Number of Addenda: 0

## 2015-06-05 NOTE — H&P (Signed)
  Procedure: Baseline screening colonoscopy  History: The patient is a 55 year old female born in 07-Apr-1960. She is scheduled to undergo her first screening colonoscopy with polypectomy to prevent colon cancer.  Past medical history: Left mastectomy to treat breast cancer. Bilateral tubal ligation. Total abdominal hysterectomy. Bilateral salpingo-oophorectomy. Migraine headache syndrome. Allergic rhinitis. Right breast cancer.  Medication allergies: Aleve and ibuprofen cause rash  Exam: The patient is alert and lying comfortably on the endoscopy stretcher. Abdomen is soft and nontender to palpation. Lungs are clear to auscultation. Cardiac exam reveals a regular rhythm.  Plan: Proceed with baseline screening colonoscopy

## 2015-06-05 NOTE — Transfer of Care (Signed)
Immediate Anesthesia Transfer of Care Note  Patient: Heidi Sexton  Procedure(s) Performed: Procedure(s): COLONOSCOPY WITH PROPOFOL (N/A)  Patient Location: PACU  Anesthesia Type:MAC  Level of Consciousness:  sedated, patient cooperative and responds to stimulation  Airway & Oxygen Therapy:Patient Spontanous Breathing and Patient connected to face mask oxgen  Post-op Assessment:  Report given to PACU RN and Post -op Vital signs reviewed and stable  Post vital signs:  Reviewed and stable  Last Vitals:  Filed Vitals:   06/05/15 0943 06/05/15 1125  BP: 127/75 102/33  Pulse: 58 64  Temp: 36.6 C   Resp: 15 15    Complications: No apparent anesthesia complications

## 2015-06-05 NOTE — Anesthesia Postprocedure Evaluation (Signed)
Anesthesia Post Note  Patient: Heidi Sexton  Procedure(s) Performed: Procedure(s) (LRB): COLONOSCOPY WITH PROPOFOL (N/A)  Patient location during evaluation: PACU Anesthesia Type: MAC Level of consciousness: awake and alert Pain management: pain level controlled Vital Signs Assessment: post-procedure vital signs reviewed and stable Respiratory status: spontaneous breathing, nonlabored ventilation, respiratory function stable and patient connected to nasal cannula oxygen Cardiovascular status: blood pressure returned to baseline and stable Postop Assessment: no signs of nausea or vomiting Anesthetic complications: no    Last Vitals:  Filed Vitals:   06/05/15 1200 06/05/15 1210  BP: 118/86 134/63  Pulse: 54 58  Temp:    Resp: 17 19    Last Pain: There were no vitals filed for this visit.               Riva Sesma L

## 2015-06-05 NOTE — Discharge Instructions (Signed)

## 2015-06-06 ENCOUNTER — Encounter (HOSPITAL_COMMUNITY): Payer: Self-pay | Admitting: Gastroenterology

## 2015-07-06 ENCOUNTER — Ambulatory Visit: Payer: 59

## 2015-07-06 ENCOUNTER — Ambulatory Visit (HOSPITAL_BASED_OUTPATIENT_CLINIC_OR_DEPARTMENT_OTHER): Payer: 59

## 2015-07-06 ENCOUNTER — Telehealth: Payer: Self-pay | Admitting: *Deleted

## 2015-07-06 VITALS — BP 124/72 | HR 75 | Temp 98.0°F | Resp 16

## 2015-07-06 DIAGNOSIS — C50411 Malignant neoplasm of upper-outer quadrant of right female breast: Secondary | ICD-10-CM

## 2015-07-06 DIAGNOSIS — Z452 Encounter for adjustment and management of vascular access device: Secondary | ICD-10-CM

## 2015-07-06 DIAGNOSIS — Z95828 Presence of other vascular implants and grafts: Secondary | ICD-10-CM

## 2015-07-06 MED ORDER — SODIUM CHLORIDE 0.9% FLUSH
10.0000 mL | INTRAVENOUS | Status: DC | PRN
  Administered 2015-07-06: 10 mL via INTRAVENOUS
  Filled 2015-07-06: qty 10

## 2015-07-06 MED ORDER — ALTEPLASE 2 MG IJ SOLR
2.0000 mg | Freq: Once | INTRAMUSCULAR | Status: AC
  Administered 2015-07-06: 2 mg
  Filled 2015-07-06: qty 2

## 2015-07-06 MED ORDER — HEPARIN SOD (PORK) LOCK FLUSH 100 UNIT/ML IV SOLN
500.0000 [IU] | Freq: Once | INTRAVENOUS | Status: AC
  Administered 2015-07-06: 500 [IU] via INTRAVENOUS
  Filled 2015-07-06: qty 5

## 2015-07-06 NOTE — Progress Notes (Signed)
Patient states her port-a-cath does not give blood return when flushed every 6 weeks. Patient states if her port is flushed every 3 weeks she doesn't require cath-flo. Flush performed by Devonne Doughty, LPN, blood return not noted. 1010 Cath-Flo instilled. Barbaraann Share, RN for Dr. Marko Plume notified patient requesting port flush in 3 weeks. Patient refuses to stay while cath-flo instilled. Patient states she will return at 12 pm to check for blood return.

## 2015-07-06 NOTE — Telephone Encounter (Signed)
Per desk RN and chemo RN I have scheduled flush appts every 3 weeks. Patient given calendar

## 2015-07-06 NOTE — Patient Instructions (Signed)

## 2015-07-06 NOTE — Progress Notes (Signed)
Patient in for [facility] flush today. Patient states, "You're going to have to put that clot busting stuff in my port because they have not been able to get any blood. They told me that's what I will need." Patient's PAC accessed and flushed without any difficulty. No blood return noted. Patient denies any pain or discomfort. Call placed to the Infusion Room. Etta Grandchild, RN made aware of no blood return from the patient's PAC. Okay to send Patient to the Infusion Room to have Alteplase administered per Royce Macadamia, RN Infusion Room Charge Nurse. Patient's PAC site covered with an Opsite dressing, and Patient discharged from the Flush Room without any complaints.

## 2015-07-06 NOTE — Progress Notes (Signed)
Pt returned after TPA dwelled in [facility] for 2 hours, TPA removed, flushed with 10cc saline, blood return noted.  Heparin locked and deaccessed, pt to return in 3 weeks for flush

## 2015-07-27 ENCOUNTER — Ambulatory Visit (HOSPITAL_BASED_OUTPATIENT_CLINIC_OR_DEPARTMENT_OTHER): Payer: 59

## 2015-07-27 DIAGNOSIS — C50411 Malignant neoplasm of upper-outer quadrant of right female breast: Secondary | ICD-10-CM | POA: Diagnosis not present

## 2015-07-27 DIAGNOSIS — Z95828 Presence of other vascular implants and grafts: Secondary | ICD-10-CM

## 2015-07-27 DIAGNOSIS — Z452 Encounter for adjustment and management of vascular access device: Secondary | ICD-10-CM

## 2015-07-27 MED ORDER — HEPARIN SOD (PORK) LOCK FLUSH 100 UNIT/ML IV SOLN
500.0000 [IU] | Freq: Once | INTRAVENOUS | Status: AC
  Administered 2015-07-27: 500 [IU] via INTRAVENOUS
  Filled 2015-07-27: qty 5

## 2015-07-27 MED ORDER — SODIUM CHLORIDE 0.9% FLUSH
10.0000 mL | INTRAVENOUS | Status: DC | PRN
  Administered 2015-07-27: 10 mL via INTRAVENOUS
  Filled 2015-07-27: qty 10

## 2015-07-27 NOTE — Progress Notes (Signed)
No blood return noted with flush. Patient refused TPA. Patient will receive another flush 08/17/15. Patient plans on discussing port removal with MD Livesay at that time.

## 2015-08-01 ENCOUNTER — Other Ambulatory Visit: Payer: Self-pay | Admitting: Oncology

## 2015-08-03 ENCOUNTER — Other Ambulatory Visit: Payer: Self-pay | Admitting: Oncology

## 2015-08-08 ENCOUNTER — Telehealth: Payer: Self-pay

## 2015-08-08 NOTE — Telephone Encounter (Signed)
Routed  this message to Dr. Barry Dienes to inform her that Dr. Marko Plume is in agreement with PAC removal.

## 2015-08-08 NOTE — Telephone Encounter (Signed)
Told Heidi Sexton that Dr. Marko Plume is fine with having the [facility] removed since it is needing TPA every time it is flushed. Told Heidi Sexton that Heidi Sexton at [facility] said that this nurse can send a message to Dr. Barry Dienes stating that Dr. Marko Plume is fine with removal and she will then put orders in for her staff to contact her to schedule the removal. Heidi Sexton verbalized understanding.

## 2015-08-16 ENCOUNTER — Other Ambulatory Visit: Payer: Self-pay | Admitting: *Deleted

## 2015-08-16 ENCOUNTER — Other Ambulatory Visit: Payer: Self-pay

## 2015-08-16 DIAGNOSIS — C50411 Malignant neoplasm of upper-outer quadrant of right female breast: Secondary | ICD-10-CM

## 2015-08-17 ENCOUNTER — Other Ambulatory Visit: Payer: 59

## 2015-08-17 ENCOUNTER — Ambulatory Visit (HOSPITAL_BASED_OUTPATIENT_CLINIC_OR_DEPARTMENT_OTHER): Payer: 59

## 2015-08-17 ENCOUNTER — Ambulatory Visit: Payer: 59 | Admitting: Oncology

## 2015-08-17 DIAGNOSIS — C50411 Malignant neoplasm of upper-outer quadrant of right female breast: Secondary | ICD-10-CM

## 2015-08-17 LAB — CBC WITH DIFFERENTIAL/PLATELET
BASO%: 0.5 % (ref 0.0–2.0)
BASOS ABS: 0 10*3/uL (ref 0.0–0.1)
EOS ABS: 0.1 10*3/uL (ref 0.0–0.5)
EOS%: 1.5 % (ref 0.0–7.0)
HCT: 38.6 % (ref 34.8–46.6)
HGB: 12.2 g/dL (ref 11.6–15.9)
LYMPH%: 41 % (ref 14.0–49.7)
MCH: 27.9 pg (ref 25.1–34.0)
MCHC: 31.5 g/dL (ref 31.5–36.0)
MCV: 88.4 fL (ref 79.5–101.0)
MONO#: 0.4 10*3/uL (ref 0.1–0.9)
MONO%: 8.4 % (ref 0.0–14.0)
NEUT#: 2.3 10*3/uL (ref 1.5–6.5)
NEUT%: 48.6 % (ref 38.4–76.8)
PLATELETS: 256 10*3/uL (ref 145–400)
RBC: 4.36 10*6/uL (ref 3.70–5.45)
RDW: 14.8 % — ABNORMAL HIGH (ref 11.2–14.5)
WBC: 4.7 10*3/uL (ref 3.9–10.3)
lymph#: 1.9 10*3/uL (ref 0.9–3.3)

## 2015-08-17 LAB — COMPREHENSIVE METABOLIC PANEL
ALT: 18 U/L (ref 0–55)
ANION GAP: 11 meq/L (ref 3–11)
AST: 16 U/L (ref 5–34)
Albumin: 3.9 g/dL (ref 3.5–5.0)
Alkaline Phosphatase: 71 U/L (ref 40–150)
BUN: 16.1 mg/dL (ref 7.0–26.0)
CALCIUM: 9.8 mg/dL (ref 8.4–10.4)
CHLORIDE: 108 meq/L (ref 98–109)
CO2: 22 mEq/L (ref 22–29)
CREATININE: 0.9 mg/dL (ref 0.6–1.1)
EGFR: 88 mL/min/{1.73_m2} — ABNORMAL LOW (ref >90)
Glucose: 125 mg/dl (ref 70–140)
Potassium: 4.2 mEq/L (ref 3.5–5.1)
Sodium: 141 mEq/L (ref 136–145)
Total Bilirubin: 0.74 mg/dL (ref 0.20–1.20)
Total Protein: 7.6 g/dL (ref 6.4–8.3)

## 2015-08-31 ENCOUNTER — Other Ambulatory Visit: Payer: Self-pay | Admitting: General Surgery

## 2015-09-06 NOTE — Telephone Encounter (Signed)
Spoke with Heidi Sexton about PAC removal not being set up. Told her that RN Sunday Spillers said that the ins. Authorization was pending as of 09-01-15.  Sunday Spillers will speak with the Breast OR scheduler Colletta [place] about  Authorization status and scheduling removal. Told Heidi Sexton that if she has not heared from [place] by 08-12-15 with appointment to call [facility] and follow up with [place] scheduler for Breast patients. Heidi Sexton verbalized understanding.

## 2015-09-20 ENCOUNTER — Encounter (HOSPITAL_BASED_OUTPATIENT_CLINIC_OR_DEPARTMENT_OTHER): Payer: Self-pay | Admitting: *Deleted

## 2015-09-26 ENCOUNTER — Ambulatory Visit (HOSPITAL_BASED_OUTPATIENT_CLINIC_OR_DEPARTMENT_OTHER): Payer: 59 | Admitting: Anesthesiology

## 2015-09-26 ENCOUNTER — Encounter (HOSPITAL_BASED_OUTPATIENT_CLINIC_OR_DEPARTMENT_OTHER)
Admission: RE | Disposition: A | Discharge status: Home / Self Care | Payer: Self-pay | Source: Ambulatory Visit | Attending: General Surgery

## 2015-09-26 ENCOUNTER — Ambulatory Visit (HOSPITAL_BASED_OUTPATIENT_CLINIC_OR_DEPARTMENT_OTHER)
Admission: RE | Admit: 2015-09-26 | Discharge: 2015-09-26 | Disposition: A | Discharge status: Home / Self Care | Payer: 59 | Source: Ambulatory Visit | Attending: General Surgery | Admitting: General Surgery

## 2015-09-26 ENCOUNTER — Encounter (HOSPITAL_BASED_OUTPATIENT_CLINIC_OR_DEPARTMENT_OTHER): Payer: Self-pay

## 2015-09-26 DIAGNOSIS — F419 Anxiety disorder, unspecified: Secondary | ICD-10-CM | POA: Insufficient documentation

## 2015-09-26 DIAGNOSIS — Z853 Personal history of malignant neoplasm of breast: Secondary | ICD-10-CM | POA: Insufficient documentation

## 2015-09-26 DIAGNOSIS — Z452 Encounter for adjustment and management of vascular access device: Secondary | ICD-10-CM | POA: Insufficient documentation

## 2015-09-26 DIAGNOSIS — Z9011 Acquired absence of right breast and nipple: Secondary | ICD-10-CM | POA: Diagnosis not present

## 2015-09-26 DIAGNOSIS — R011 Cardiac murmur, unspecified: Secondary | ICD-10-CM | POA: Insufficient documentation

## 2015-09-26 DIAGNOSIS — Z87891 Personal history of nicotine dependence: Secondary | ICD-10-CM | POA: Diagnosis not present

## 2015-09-26 DIAGNOSIS — Z90710 Acquired absence of both cervix and uterus: Secondary | ICD-10-CM | POA: Diagnosis not present

## 2015-09-26 DIAGNOSIS — Z886 Allergy status to analgesic agent status: Secondary | ICD-10-CM | POA: Insufficient documentation

## 2015-09-26 HISTORY — PX: PORT-A-CATH REMOVAL: SHX5289

## 2015-09-26 SURGERY — REMOVAL PORT-A-CATH
Anesthesia: Monitor Anesthesia Care | Site: Chest | Laterality: Right

## 2015-09-26 MED ORDER — BUPIVACAINE HCL (PF) 0.25 % IJ SOLN
INTRAMUSCULAR | Status: AC
  Filled 2015-09-26: qty 30

## 2015-09-26 MED ORDER — LIDOCAINE HCL (CARDIAC) 20 MG/ML IV SOLN
INTRAVENOUS | Status: DC | PRN
  Administered 2015-09-26: 20 mg via INTRAVENOUS

## 2015-09-26 MED ORDER — CEFAZOLIN SODIUM-DEXTROSE 2-4 GM/100ML-% IV SOLN
2.0000 g | INTRAVENOUS | Status: AC
  Administered 2015-09-26: 2 g via INTRAVENOUS

## 2015-09-26 MED ORDER — LACTATED RINGERS IV SOLN
INTRAVENOUS | Status: DC
  Administered 2015-09-26: 10:00:00 via INTRAVENOUS

## 2015-09-26 MED ORDER — CHLORHEXIDINE GLUCONATE CLOTH 2 % EX PADS: 6.0000 | MEDICATED_PAD | Freq: Once | CUTANEOUS | Status: DC

## 2015-09-26 MED ORDER — BSS IO SOLN
INTRAOCULAR | Status: AC
  Filled 2015-09-26: qty 15

## 2015-09-26 MED ORDER — FENTANYL CITRATE (PF) 100 MCG/2ML IJ SOLN: 25.0000 ug | INTRAMUSCULAR | Status: DC | PRN

## 2015-09-26 MED ORDER — FENTANYL CITRATE (PF) 100 MCG/2ML IJ SOLN
50.0000 ug | INTRAMUSCULAR | Status: DC | PRN
  Administered 2015-09-26: 100 ug via INTRAVENOUS

## 2015-09-26 MED ORDER — LIDOCAINE-EPINEPHRINE (PF) 1 %-1:200000 IJ SOLN
INTRAMUSCULAR | Status: DC | PRN
  Administered 2015-09-26: 10 mL

## 2015-09-26 MED ORDER — FENTANYL CITRATE (PF) 100 MCG/2ML IJ SOLN
INTRAMUSCULAR | Status: AC
  Filled 2015-09-26: qty 2

## 2015-09-26 MED ORDER — MIDAZOLAM HCL 2 MG/2ML IJ SOLN
1.0000 mg | INTRAMUSCULAR | Status: DC | PRN
  Administered 2015-09-26: 2 mg via INTRAVENOUS

## 2015-09-26 MED ORDER — SCOPOLAMINE 1 MG/3DAYS TD PT72: 1.0000 | MEDICATED_PATCH | Freq: Once | TRANSDERMAL | Status: DC | PRN

## 2015-09-26 MED ORDER — GLYCOPYRROLATE 0.2 MG/ML IJ SOLN: 0.2000 mg | Freq: Once | INTRAMUSCULAR | Status: DC | PRN

## 2015-09-26 MED ORDER — PROPOFOL 10 MG/ML IV BOLUS
INTRAVENOUS | Status: DC | PRN
  Administered 2015-09-26 (×3): 20 mg via INTRAVENOUS

## 2015-09-26 MED ORDER — ONDANSETRON HCL 4 MG/2ML IJ SOLN
INTRAMUSCULAR | Status: DC | PRN
  Administered 2015-09-26: 4 mg via INTRAVENOUS

## 2015-09-26 MED ORDER — OXYCODONE HCL 5 MG PO TABS: 5.0000 mg | ORAL_TABLET | Freq: Four times a day (QID) | ORAL | 0 refills | Status: DC | PRN

## 2015-09-26 MED ORDER — BUPIVACAINE HCL (PF) 0.25 % IJ SOLN
INTRAMUSCULAR | Status: DC | PRN
  Administered 2015-09-26: 10 mL

## 2015-09-26 MED ORDER — MIDAZOLAM HCL 2 MG/2ML IJ SOLN
INTRAMUSCULAR | Status: AC
  Filled 2015-09-26: qty 2

## 2015-09-26 MED ORDER — CEFAZOLIN SODIUM-DEXTROSE 2-4 GM/100ML-% IV SOLN
INTRAVENOUS | Status: AC
  Filled 2015-09-26: qty 100

## 2015-09-26 MED ORDER — LIDOCAINE-EPINEPHRINE (PF) 1 %-1:200000 IJ SOLN
INTRAMUSCULAR | Status: AC
  Filled 2015-09-26: qty 30

## 2015-09-26 SURGICAL SUPPLY — 36 items
BLADE HEX COATED 2.75 (ELECTRODE) ×3 IMPLANT
BLADE SURG 15 STRL LF DISP TIS (BLADE) ×1 IMPLANT
BLADE SURG 15 STRL SS (BLADE) ×2
CANISTER SUCT 1200ML W/VALVE (MISCELLANEOUS) IMPLANT
CHLORAPREP W/TINT 26ML (MISCELLANEOUS) ×3 IMPLANT
COVER BACK TABLE 60X90IN (DRAPES) ×3 IMPLANT
COVER MAYO STAND STRL (DRAPES) ×3 IMPLANT
DECANTER SPIKE VIAL GLASS SM (MISCELLANEOUS) IMPLANT
DRAPE LAPAROTOMY 100X72 PEDS (DRAPES) ×3 IMPLANT
DRAPE UTILITY XL STRL (DRAPES) ×3 IMPLANT
ELECT REM PT RETURN 9FT ADLT (ELECTROSURGICAL) ×3
ELECTRODE REM PT RTRN 9FT ADLT (ELECTROSURGICAL) ×1 IMPLANT
GLOVE BIO SURGEON STRL SZ 6 (GLOVE) ×3 IMPLANT
GLOVE BIO SURGEON STRL SZ 6.5 (GLOVE) ×2 IMPLANT
GLOVE BIO SURGEONS STRL SZ 6.5 (GLOVE) ×1
GLOVE BIOGEL PI IND STRL 6.5 (GLOVE) ×1 IMPLANT
GLOVE BIOGEL PI IND STRL 7.0 (GLOVE) IMPLANT
GLOVE BIOGEL PI INDICATOR 6.5 (GLOVE) ×2
GLOVE BIOGEL PI INDICATOR 7.0 (GLOVE)
GOWN STRL REUS W/ TWL LRG LVL3 (GOWN DISPOSABLE) ×1 IMPLANT
GOWN STRL REUS W/TWL 2XL LVL3 (GOWN DISPOSABLE) ×3 IMPLANT
GOWN STRL REUS W/TWL LRG LVL3 (GOWN DISPOSABLE) ×2
LIQUID BAND (GAUZE/BANDAGES/DRESSINGS) ×3 IMPLANT
NEEDLE HYPO 25X1 1.5 SAFETY (NEEDLE) ×3 IMPLANT
NS IRRIG 1000ML POUR BTL (IV SOLUTION) IMPLANT
PACK BASIN DAY SURGERY FS (CUSTOM PROCEDURE TRAY) ×3 IMPLANT
PENCIL BUTTON HOLSTER BLD 10FT (ELECTRODE) ×3 IMPLANT
SUT MNCRL AB 4-0 PS2 18 (SUTURE) ×3 IMPLANT
SUT VIC AB 3-0 SH 27 (SUTURE) ×2
SUT VIC AB 3-0 SH 27X BRD (SUTURE) ×1 IMPLANT
SYR CONTROL 10ML LL (SYRINGE) ×3 IMPLANT
TOWEL OR 17X24 6PK STRL BLUE (TOWEL DISPOSABLE) ×3 IMPLANT
TOWEL OR NON WOVEN STRL DISP B (DISPOSABLE) ×3 IMPLANT
TUBE CONNECTING 20'X1/4 (TUBING)
TUBE CONNECTING 20X1/4 (TUBING) IMPLANT
YANKAUER SUCT BULB TIP NO VENT (SUCTIONS) IMPLANT

## 2015-09-26 NOTE — Anesthesia Postprocedure Evaluation (Signed)
Anesthesia Post Note  Patient: Heidi Sexton  Procedure(s) Performed: Procedure(s) (LRB): REMOVAL PORT-A-CATH (Right)  Patient location during evaluation: PACU Anesthesia Type: MAC Level of consciousness: awake and alert Pain management: pain level controlled Vital Signs Assessment: post-procedure vital signs reviewed and stable Respiratory status: spontaneous breathing, nonlabored ventilation and respiratory function stable Cardiovascular status: stable and blood pressure returned to baseline Anesthetic complications: no    Last Vitals:  Vitals:   09/26/15 1115 09/26/15 1118  BP: 125/74   Pulse: 60 64  Resp: 14 16  Temp:      Last Pain:  Vitals:   09/26/15 0937  TempSrc: Oral                 Gurneet Matarese,W. EDMOND

## 2015-09-26 NOTE — H&P (Signed)
Heidi Sexton is an 55 y.o. female.   Chief Complaint: right triple negative breast cancer HPI:  Pt is a 55 yo F s/p right mastectomy and reconstruction recently for triple negative breast cancer.  She received post op chemotherapy.  She is ready for port removal.    Past Medical History:  Diagnosis Date  . Anxiety    h/o panic attacks - relative to migranie headaches -pt denies? anxiety related to Chemotherapy  . Breast cancer [facility])    "both breasts" 4'16- Cancer rt.breast '2015-with chemo last Tx. 4'16-Dr. Livesay follows.  . Family history of anesthesia complication    "daughter got nauseated also"  . H/O seasonal allergies    OTC  . Heart murmur    during pregnancy only- then resolved   . Migraine    "maybe 2/yr" (11/16/2013)  . PONV (postoperative nausea and vomiting)   . Wears glasses     Past Surgical History:  Procedure Laterality Date  . ABDOMINAL HYSTERECTOMY  2011   bso  . BREAST BIOPSY Left 1996  . BREAST BIOPSY Right 09/2013  . BREAST RECONSTRUCTION Left 2001  . BREAST RECONSTRUCTION Right 11/16/2013   Procedure: BREAST RECONSTRUCTION;  Surgeon: Crissie Reese, MD;  Location: [place];  Service: Plastics;  Laterality: Right;  . BREAST SURGERY  1996   left mast-axillary node dissection  . COLONOSCOPY WITH PROPOFOL N/A 06/05/2015   Procedure: COLONOSCOPY WITH PROPOFOL;  Surgeon: Garlan Fair, MD;  Location: [facility];  Service: Endoscopy;  Laterality: N/A;  . LATISSIMUS FLAP TO BREAST Right 11/16/2013   Procedure: LATISSIMUS FLAP TO BREAST WITH SALINE IMPLANTS;  Surgeon: Crissie Reese, MD;  Location: [place];  Service: Plastics;  Laterality: Right;/ Tram Flap on Left.  Marland Kitchen MASTECTOMY Left 1996  . MASTECTOMY COMPLETE / SIMPLE W/ SENTINEL NODE BIOPSY Right 11/16/2013  . MASTECTOMY W/ SENTINEL NODE BIOPSY Right 11/16/2013   Procedure: RIGHT MASTECTOMY WITH SENTINEL LYMPH NODE BIOPSY;  Surgeon: [name], MD;  Location: [place];  Service: General;  Laterality: Right;  .  PORT-A-CATH REMOVAL  1996  . [place]  . PORTACATH PLACEMENT Right 12/27/2013   Procedure: INSERTION PORT-A-CATH;  Surgeon: [name], MD;  Location: [place];  Service: General;  Laterality: Right;  . RECONSTRUCTION BREAST W/ LATISSIMUS DORSI FLAP Right 11/16/2013   w/saline implants  . TUNNELED VENOUS CATHETER PLACEMENT  1996   "[place]; removed later in 1996"    Family History  Problem Relation Age of Onset  . Liver cancer Mother    Social History:  reports that she quit smoking about 21 years ago. Her smoking use included Cigarettes. She has a 1.50 pack-year smoking history. She has never used smokeless tobacco. She reports that she drinks alcohol. She reports that she does not use drugs.  Allergies:  Allergies  Allergen Reactions  . Adhesive [Tape] Hives  . Aleve [Naproxen] Itching  . Advil [Ibuprofen]     Itchy red face    Medications Prior to Admission  Medication Sig Dispense Refill  . acetaminophen (TYLENOL) 500 MG tablet Take 500-1,000 mg by mouth every 6 (six) hours as needed for mild pain.     . Cholecalciferol (VITAMIN D) 2000 UNITS tablet Take 2,000 Units by mouth daily.    Marland Kitchen gabapentin (NEURONTIN) 300 MG capsule Take 1 capsule (300 mg total) by mouth 3 (three) times daily. 90 capsule 5  . loratadine (CLARITIN) 10 MG tablet Take 10 mg by mouth daily as needed for allergies.  No results found for this or any previous visit (from the past 48 hour(s)). No results found.  Review of Systems  All other systems reviewed and are negative.   Height 5' 7.5" (1.715 m), weight 102.2 kg (225 lb 6 oz). Physical Exam  Constitutional: She is oriented to person, place, and time. She appears well-developed and well-nourished. No distress.  HENT:  Head: Normocephalic and atraumatic.  Eyes: Conjunctivae are normal. Pupils are equal, round, and reactive to light. No scleral icterus.  Neck: Normal range of motion. Neck supple. No  thyromegaly present.  Cardiovascular: Normal rate, regular rhythm and normal heart sounds.   Respiratory: Effort normal. No respiratory distress.  GI: Soft.  Musculoskeletal: Normal range of motion.  Neurological: She is alert and oriented to person, place, and time.  Skin: Skin is warm and dry. She is not diaphoretic.  Psychiatric: She has a normal mood and affect. Her behavior is normal. Judgment and thought content normal.     Assessment/Plan Right breast cancer  Remove port a cath.   Discussed risks and post op recovery/restrictions.    [name], MD 09/26/2015, 10:14 AM

## 2015-09-26 NOTE — Transfer of Care (Signed)
Immediate Anesthesia Transfer of Care Note  Patient: Heidi Sexton  Procedure(s) Performed: Procedure(s): REMOVAL PORT-A-CATH (Right)  Patient Location: PACU  Anesthesia Type:MAC  Level of Consciousness: awake, alert  and oriented  Airway & Oxygen Therapy: Patient Spontanous Breathing and Patient connected to face mask oxygen  Post-op Assessment: Report given to RN and Post -op Vital signs reviewed and stable  Post vital signs: Reviewed and stable  Last Vitals:  Vitals:   09/26/15 1059  Pulse: 68    Last Pain:  Vitals:   09/26/15 0937  TempSrc: Oral         Complications: No apparent anesthesia complications

## 2015-09-26 NOTE — Discharge Instructions (Addendum)
[facility] °Office Phone Number [phone] ° ° POST OP INSTRUCTIONS ° °Always review your discharge instruction sheet given to you by the facility where your surgery was performed. ° °IF YOU HAVE DISABILITY OR FAMILY LEAVE FORMS, YOU MUST BRING THEM TO THE OFFICE FOR PROCESSING.  DO NOT GIVE THEM TO YOUR DOCTOR. ° °1. A prescription for pain medication may be given to you upon discharge.  Take your pain medication as prescribed, if needed.  If narcotic pain medicine is not needed, then you may take acetaminophen (Tylenol) or ibuprofen (Advil) as needed. °2. Take your usually prescribed medications unless otherwise directed °3. If you need a refill on your pain medication, please contact your pharmacy.  They will contact our office to request authorization.  Prescriptions will not be filled after 5pm or on week-ends. °4. You should eat very light the first 24 hours after surgery, such as soup, crackers, pudding, etc.  Resume your normal diet the day after surgery °5. It is common to experience some constipation if taking pain medication after surgery.  Increasing fluid intake and taking a stool softener will usually help or prevent this problem from occurring.  A mild laxative (Milk of Magnesia or Miralax) should be taken according to package directions if there are no bowel movements after 48 hours. °6. You may shower in 48 hours.  The surgical glue will flake off in 2-3 weeks.   °7. ACTIVITIES:  No strenuous activity or heavy lifting for 1 week.   °a. You may drive when you no longer are taking prescription pain medication, you can comfortably wear a seatbelt, and you can safely maneuver your car and apply brakes. °b. RETURN TO WORK:  _________1 week_____________ °You should see your doctor in the office for a follow-up appointment approximately three-four weeks after your surgery.   ° °WHEN TO CALL YOUR DOCTOR: °1. Fever over 101.0 °2. Nausea and/or vomiting. °3. Extreme swelling or  bruising. °4. Continued bleeding from incision. °5. Increased pain, redness, or drainage from the incision. ° °The clinic staff is available to answer your questions during regular business hours.  Please don’t hesitate to call and ask to speak to one of the nurses for clinical concerns.  If you have a medical emergency, go to the nearest emergency room or call 911.  A surgeon from [facility] is always on call at the hospital. ° °For further questions, please visit [email or web address]  ° ° °Post Anesthesia Home Care Instructions ° °Activity: °Get plenty of rest for the remainder of the day. A responsible adult should stay with you for 24 hours following the procedure.  °For the next 24 hours, DO NOT: °-Drive a car °-Operate machinery °-Drink alcoholic beverages °-Take any medication unless instructed by your physician °-Make any legal decisions or sign important papers. ° °Meals: °Start with liquid foods such as gelatin or soup. Progress to regular foods as tolerated. Avoid greasy, spicy, heavy foods. If nausea and/or vomiting occur, drink only clear liquids until the nausea and/or vomiting subsides. Call your physician if vomiting continues. ° °Special Instructions/Symptoms: °Your throat may feel dry or sore from the anesthesia or the breathing tube placed in your throat during surgery. If this causes discomfort, gargle with warm salt water. The discomfort should disappear within 24 hours. ° °If you had a scopolamine patch placed behind your ear for the management of post- operative nausea and/or vomiting: ° °1. The medication in the patch is effective for 72 hours, after which it should   be removed.  Wrap patch in a tissue and discard in the trash. Wash hands thoroughly with soap and water. 2. You may remove the patch earlier than 72 hours if you experience unpleasant side effects which may include dry mouth, dizziness or visual disturbances. 3. Avoid touching the patch. Wash your hands with  soap and water after contact with the patch.   Call your surgeon if you experience:   1.  Fever over 101.0. 2.  Inability to urinate. 3.  Nausea and/or vomiting. 4.  Extreme swelling or bruising at the surgical site. 5.  Continued bleeding from the incision. 6.  Increased pain, redness or drainage from the incision. 7.  Problems related to your pain medication. 8.  Any problems and/or concerns

## 2015-09-26 NOTE — Op Note (Signed)
  PRE-OPERATIVE DIAGNOSIS:  un-needed Port-A-Cath for right breast cancer  POST-OPERATIVE DIAGNOSIS:  Same   PROCEDURE:  Procedure(s):  REMOVAL PORT-A-CATH  SURGEON:  Surgeon(s):  [name], MD  ANESTHESIA:   MAC + local  EBL:   Minimal  SPECIMEN:  None  Complications : none known  Procedure:   Pt was  identified in the holding area and taken to the operating room where she was placed supine on the operating room table.  MAC anesthesia was induced.  The right upper chest was prepped and draped.  The prior incision was anesthetized with local anesthetic.  The incision was opened with a #15 blade.  The subcutaneous tissue was divided with the cautery.  The port was identified and the capsule opened.  The four 2-0 prolene sutures were removed.  The port was then removed and pressure held on the tract.  The catheter appeared intact without evidence of breakage, length was 20.5.  The wound was inspected for hemostasis, which was achieved with cautery.  The wound was closed with 3-0 vicryl deep dermal interrupted sutures and 4-0 Monocryl running subcuticular suture.  The wound was cleaned, dried, and dressed with dermabond.  The patient was awakened from anesthesia and taken to the PACU in stable condition.  Needle, sponge, and instrument counts are correct.

## 2015-09-26 NOTE — Anesthesia Preprocedure Evaluation (Addendum)
Anesthesia Evaluation  Patient identified by MRN, date of birth, ID band Patient awake    Reviewed: Allergy & Precautions, H&P , NPO status , Patient's Chart, lab work & pertinent test results  History of Anesthesia Complications (+) PONV  Airway Mallampati: II  TM Distance: >3 FB Neck ROM: Full    Dental no notable dental hx. (+) Teeth Intact, Dental Advisory Given   Pulmonary neg pulmonary ROS, former smoker,    Pulmonary exam normal breath sounds clear to auscultation       Cardiovascular negative cardio ROS   Rhythm:Regular Rate:Normal     Neuro/Psych  Headaches, Anxiety negative psych ROS   GI/Hepatic negative GI ROS, Neg liver ROS,   Endo/Other  negative endocrine ROS  Renal/GU negative Renal ROS  negative genitourinary   Musculoskeletal   Abdominal   Peds  Hematology  (+) anemia ,   Anesthesia Other Findings   Reproductive/Obstetrics negative OB ROS                            Anesthesia Physical Anesthesia Plan  ASA: II  Anesthesia Plan: MAC   Post-op Pain Management:    Induction: Intravenous  Airway Management Planned: Simple Face Mask  Additional Equipment:   Intra-op Plan:   Post-operative Plan:   Informed Consent: I have reviewed the patients History and Physical, chart, labs and discussed the procedure including the risks, benefits and alternatives for the proposed anesthesia with the patient or authorized representative who has indicated his/her understanding and acceptance.   Dental advisory given  Plan Discussed with: CRNA  Anesthesia Plan Comments:         Anesthesia Quick Evaluation

## 2015-09-28 ENCOUNTER — Encounter (HOSPITAL_BASED_OUTPATIENT_CLINIC_OR_DEPARTMENT_OTHER): Payer: Self-pay | Admitting: General Surgery

## 2015-10-04 ENCOUNTER — Other Ambulatory Visit: Payer: Self-pay | Admitting: Oncology

## 2015-10-05 ENCOUNTER — Ambulatory Visit (HOSPITAL_BASED_OUTPATIENT_CLINIC_OR_DEPARTMENT_OTHER): Payer: 59 | Admitting: Oncology

## 2015-10-05 ENCOUNTER — Other Ambulatory Visit (HOSPITAL_BASED_OUTPATIENT_CLINIC_OR_DEPARTMENT_OTHER): Payer: 59

## 2015-10-05 ENCOUNTER — Encounter: Payer: Self-pay | Admitting: Oncology

## 2015-10-05 VITALS — BP 128/93 | HR 72 | Temp 98.8°F | Resp 18 | Wt 227.6 lb

## 2015-10-05 DIAGNOSIS — G620 Drug-induced polyneuropathy: Secondary | ICD-10-CM

## 2015-10-05 DIAGNOSIS — Z1501 Genetic susceptibility to malignant neoplasm of breast: Secondary | ICD-10-CM

## 2015-10-05 DIAGNOSIS — Z853 Personal history of malignant neoplasm of breast: Secondary | ICD-10-CM | POA: Diagnosis not present

## 2015-10-05 DIAGNOSIS — C50411 Malignant neoplasm of upper-outer quadrant of right female breast: Secondary | ICD-10-CM

## 2015-10-05 DIAGNOSIS — M542 Cervicalgia: Secondary | ICD-10-CM

## 2015-10-05 DIAGNOSIS — C50911 Malignant neoplasm of unspecified site of right female breast: Secondary | ICD-10-CM

## 2015-10-05 DIAGNOSIS — T451X5A Adverse effect of antineoplastic and immunosuppressive drugs, initial encounter: Secondary | ICD-10-CM

## 2015-10-05 DIAGNOSIS — Z1509 Genetic susceptibility to other malignant neoplasm: Secondary | ICD-10-CM

## 2015-10-05 LAB — CBC WITH DIFFERENTIAL/PLATELET
BASO%: 0.5 % (ref 0.0–2.0)
BASOS ABS: 0 10*3/uL (ref 0.0–0.1)
EOS ABS: 0.1 10*3/uL (ref 0.0–0.5)
EOS%: 2.2 % (ref 0.0–7.0)
HCT: 38.2 % (ref 34.8–46.6)
HEMOGLOBIN: 12.2 g/dL (ref 11.6–15.9)
LYMPH%: 37.4 % (ref 14.0–49.7)
MCH: 27.8 pg (ref 25.1–34.0)
MCHC: 31.8 g/dL (ref 31.5–36.0)
MCV: 87.5 fL (ref 79.5–101.0)
MONO#: 0.5 10*3/uL (ref 0.1–0.9)
MONO%: 10.2 % (ref 0.0–14.0)
NEUT%: 49.7 % (ref 38.4–76.8)
NEUTROS ABS: 2.4 10*3/uL (ref 1.5–6.5)
PLATELETS: 302 10*3/uL (ref 145–400)
RBC: 4.37 10*6/uL (ref 3.70–5.45)
RDW: 14.5 % (ref 11.2–14.5)
WBC: 4.9 10*3/uL (ref 3.9–10.3)
lymph#: 1.8 10*3/uL (ref 0.9–3.3)

## 2015-10-05 LAB — COMPREHENSIVE METABOLIC PANEL
ALK PHOS: 79 U/L (ref 40–150)
ALT: 19 U/L (ref 0–55)
AST: 17 U/L (ref 5–34)
Albumin: 3.8 g/dL (ref 3.5–5.0)
Anion Gap: 9 mEq/L (ref 3–11)
BUN: 15.7 mg/dL (ref 7.0–26.0)
CO2: 23 meq/L (ref 22–29)
Calcium: 9.7 mg/dL (ref 8.4–10.4)
Chloride: 107 mEq/L (ref 98–109)
Creatinine: 0.9 mg/dL (ref 0.6–1.1)
EGFR: 87 mL/min/{1.73_m2} — AB (ref >90)
GLUCOSE: 121 mg/dL (ref 70–140)
POTASSIUM: 4.5 meq/L (ref 3.5–5.1)
Sodium: 139 mEq/L (ref 136–145)
Total Bilirubin: 0.76 mg/dL (ref 0.20–1.20)
Total Protein: 7.6 g/dL (ref 6.4–8.3)

## 2015-10-05 NOTE — Progress Notes (Signed)
OFFICE PROGRESS NOTE   October 05, 2015   Physicians:F.Barry Dienes, D.Harlow Mares, K.Winifred Olive (GYN), S.Squire, M.Johnson  INTERVAL HISTORY  Patient is seen, alone for visit, in continuing follow up of history of bilateral breast cancer in setting of multiple BRCA 1 and 2 variants. First left breast 1996 when [redacted] weeks pregnant; second triple negative on right 09-2013. She has had bilateral mastectomies and hysterectomy BSO. She is on observation since completing adjuvant therapy 05-17-14.  She had PAC removed by Dr Barry Dienes 09-26-15 I have asked her to continue some follow up with gyn due to some risk of primary peritoneal malignancy even with hysterectomy BSO. She had colonoscopy by Dr Earle Gell [phone] She sees PCP Dr Lysle Rubens on regular basis.   Patient is under a lot of stress at work, has been with [employer] x 30 years and hopes to retire in next 2-3 years. Site of previous PAC is healing well. She has not kept up with exercising due to work, encouraged her to resume this. She still notices shooting pain right side of neck up to head when she flexes neck as when shaving under left arm; she occasionally gets the same sensation when she flexes neck similarly to the right, with pain again on the right. No other new or different pain. Still some spasms right lateral back where latissimus flap; less frequent muscle spasms left chest at that surgical area now. Appetite fine, no GI problems, no respiratory problems, no bleeding, no fever or recent infecions, bladder ok. Energy at baseline. Neuropathy has improved but not resolved, mostly in feet. Remainder of 10 point Review of Systems negative.    PAC removed by Dr Barry Dienes 09-26-15 Multiple BRCA 1 and 2 variants  Chemo including high dose with autologous BMT 1996. Additional chemo 2015. Hysterectomy with oophorectomy 2011 Colonoscopy 05-2015 Dr Earle Gell  Daughter [name] is doing well, now 79, at community college in  [place].  ONCOLOGIC HISTORY History is of left breast cancer in June 1996, when she was [redacted] weeks pregnant with her only child. This was 4x4x2.5 cm poorly differentiated invasive ductal and comedo carciinoma, ER PR negative, HER 2 not tested in 1996. She was treated with 2 cycles of adriamycin at 60 mg/m2 in [place] as she completed the pregnancy, then had left mastectomy with 12 of 23 left axillary nodes positive. She had additional adriamycin and cytoxan in [place] to total adriamycin dose of 300 mg/m2, followed by high dose chemotherapy with cytoxan, CDDP and autologous transplant on protocol at [place] (Dr J.Vredenburgh) , and local radiation on left. We have received treatment records from [facility] for high dose chemotherapy and autologous transplant done 1996, which I have reviewed and which will be scanned into this EMR. She had left TRAM reconstruction. She had multiple BRCA 1 and 2 variants at last genetics testing. She had hysterectomy with oophorectomy ~ 08-2009, but was reluctant to proceed with prophylactic right mastectomy. She was followed with mammograms + yearly MRIs due to BRCA abnormalities.  Right breast cancer was identified on tomo mammography at [facility] 09-24-13, with heterogeneously dense breast tissue and suspicious area upper outer quadrant. She had MRI 09-27-13 with 9x5x6 mm mass upper outer quadrant on right, otherwise unremarkable bilaterally. US biopsy at [facility] 10-04-13 [phone]) documented invasive mammary carcinoma ER PR and HER 2 negative and proliferation fraction 87%. After consultation with general surgery, plastic surgery, medical oncology and radiation oncology, she agreed to right mastectomy. She had staging CT CAP 10-13-13, unremarkable  other than 5 mm low density lesion in dome of right hepatic lobe likely cyst and scarring left lung apex likely from prior RT.She had right mastectomy with sentinel node evaluation and immediate reconstruction with  right latissimus myocutaneous flap + saline implant by Drs Barry Dienes and Harlow Mares on 11-16-13. [place] pathology [phone] from 11-16-13 had grade 3 invasive ductal carcinoma 0.6 cm with 3 sentinel axillary nodes + 1 nonsentinel node negative, closest margin 1 cm, no LVSI, repeat ER and PR testing both 0% and not sufficient tumor for repeat HER 2 (which had been negative on diagnostic biopsy 09-30-13 [phone]). She had first carboplatin + taxotere on 12-28-13, with neulasta day 2. She completed cycle 4 carbo taxotere on 03-01-14. First weekly taxol 03-22-14, planned x 12 weeks. Weeks 7 and 8 held due to progressive neuropathy hands and feet, with some improvement following that break. Taxol resumed on 05-17-14, with worsening of peripheral neuropathy. Total of 7 taxol treatments given thru 05-17-14, discontinued then due to severity of neuropathy feet and hands.    Objective:  Vital signs in last 24 hours:  BP (!) 128/93 (BP Location: Left Arm, Patient Position: Sitting)   Pulse 72   Temp 98.8 F (37.1 C) (Oral)   Resp 18   Wt 227 lb 9.6 oz (103.2 kg)   SpO2 100%   BMI 35.12 kg/m  Weight down 1 lb Alert, oriented and appropriate. Ambulatory without difficulty.   HEENT:PERRL, sclerae not icteric. Oral mucosa moist without lesions, posterior pharynx clear.  C spine not tender. No JVD or thyroid mass. Lymphatics:no cervical,supraclavicular, axillary or inguinal adenopathy Resp: clear to auscultation bilaterally and normal percussion bilaterally Cardio: regular rate and rhythm. No gallop. GI: soft, nontender, not distended, no mass or organomegaly. Normally active bowel sounds. Surgical incision not remarkable. Musculoskeletal/ Extremities: UE/ LE without pitting edema, cords, tenderness. Scar right lateral back from latissimus flap.  Neuro: peripheral neuropathy feet > fingers. Otherwise nonfocal. PSYCH appropriate mood and affect Skin without rash, ecchymosis, petechiae Breasts: bilateral  mastectomies with reconstructions, no evidence of local recurrences Site of previous PAC closed, not remarkable.  Lab Results:  Results for orders placed or performed in visit on 10/05/15  CBC with Differential  Result Value Ref Range   WBC 4.9 3.9 - 10.3 10e3/uL   NEUT# 2.4 1.5 - 6.5 10e3/uL   HGB 12.2 11.6 - 15.9 g/dL   HCT 38.2 34.8 - 46.6 %   Platelets 302 145 - 400 10e3/uL   MCV 87.5 79.5 - 101.0 fL   MCH 27.8 25.1 - 34.0 pg   MCHC 31.8 31.5 - 36.0 g/dL   RBC 4.37 3.70 - 5.45 10e6/uL   RDW 14.5 11.2 - 14.5 %   lymph# 1.8 0.9 - 3.3 10e3/uL   MONO# 0.5 0.1 - 0.9 10e3/uL   Eosinophils Absolute 0.1 0.0 - 0.5 10e3/uL   Basophils Absolute 0.0 0.0 - 0.1 10e3/uL   NEUT% 49.7 38.4 - 76.8 %   LYMPH% 37.4 14.0 - 49.7 %   MONO% 10.2 0.0 - 14.0 %   EOS% 2.2 0.0 - 7.0 %   BASO% 0.5 0.0 - 2.0 %  Comprehensive metabolic panel  Result Value Ref Range   Sodium 139 136 - 145 mEq/L   Potassium 4.5 3.5 - 5.1 mEq/L   [place] 107 98 - 109 mEq/L   CO2 23 22 - 29 mEq/L   Glucose 121 70 - 140 mg/dl   BUN 15.7 7.0 - 26.0 mg/dL   Creatinine 0.9 0.6 -  1.1 mg/dL   Total Bilirubin 0.76 0.20 - 1.20 mg/dL   Alkaline Phosphatase 79 40 - 150 U/L   AST 17 5 - 34 U/L   ALT 19 0 - 55 U/L   Total Protein 7.6 6.4 - 8.3 g/dL   Albumin 3.8 3.5 - 5.0 g/dL   Calcium 9.7 8.4 - 10.4 mg/dL   Anion Gap 9 3 - 11 mEq/L   EGFR 87 (L) >90 ml/min/1.73 m2     Studies/Results:  Colonoscopy by Dr Earle Gell 06-05-15, conclusion: One 3 mm polyp in the proximal ascending colon, removed with a cold biopsy forceps. Resected and retrieved. - One 3 mm polyp in the mid ascending colon, removed with a cold biopsy forceps. Resected and retrieved. - One 3 mm polyp in the rectum, removed with a cold biopsy forceps. Resected and retrieved. - One 2 mm polyp in the rectum, removed with a cold biopsy forceps. Resected and retrieved. - The examination was otherwise normal.   Pathology from colonoscopy  06-05-15: ELANDRA, POWELL Collected: 06/05/2015 Client: [facility] Accession: [id number] Received: 06/05/2015 Marcelyn Bruins OF SURGICAL PATHOLOGY FINAL DIAGNOSIS Diagnosis Colon, polyp(s), right ascending - TUBULAR ADENOMA, ONE FRAGMENT. - BENIGN POLYPOID COLORECTAL MUCOSA, THREE FRAGMENTS. - NO HIGH GRADE DYSPLASIA OR MALIGNANCY IDENTIFIED. Willeen Niece MD   Medications: I have reviewed the patient's current medications.  DISCUSSION She is pleased to have PAC out so that she does not need regular flushes for this. Tho we originally discussed longer interval follow up at this office, this should be at least every 6 months due to most recent breast cancer 10-2013.  I recommended some follow up with gyn ongoing.   With persistant neck discomfort, will get plain cervical spine xrays, which patient prefers to do in next week or so rather than today.      Assessment/Plan:  1.Triple negative right breast cancer 0.6 cm, 4 nodes negative: post right mastectomy with axillary node evaluation and immediate reconstruction with latissimus flap 11-16-13. Adjuvant chemo with 4 cycles carbo taxotere 12-28-13 thru 03-01-14, then weekly taxol x7 from 03-22-14 thru 1-155-2, complicated by significant peripheral neuropathy such that remainder of planned 12 cycles not given. Clinically doing well from standpoint of the breast cancer.  Needs 6 month follow up at this office 2.left breast cancer 1996: 4 cm primary, 12 of 23 nodes involved, ER PR negative (HER 2 not tested in 1996), diagnosed when 7 months pregnant, treated with adriamycin prior to delivery, then [facility] to total adria dose 300 mg/m2, then high dose therapy with cytoxan, CDDP, carmustine and autologous BMT at [facility], then RT. No known recurrent disease. Left TRAM reconstruction.  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  Gabapentin helpful for symptoms,  continue B complex. Podiatry offered no other interventions 5.zoster shortly after completion of treatment 1996 6.flu vaccine appropriate this fall 7.hysterectomy with oophorectomy 2011 8. PAC necessary for most recent chemo due to surgeries bilaterally as above and kept for lab access initially, had not been working easily x several months,  removed by Dr Barry Dienes 09-2015 9.right chest pain muscle spasm,  prn muscle relaxants 10. Bilateral positional neck discomfort: ongoing intermittently for at least 6 months. Will get plain xrays C spine in next week or so and need to let her know results.  11.Progressive weight gain, BMI 35.4. Encouraged her to address with the diet and exercise assistance program that she began early 03-2015. 12.last bone  density scan in this EMR 2012. Note premature surgical menopause  All questions answered and patient is in agreement with recommendations and plans. Time spent 25 min includng >50% counseling and coordination of care. Route Dr Lysle Rubens, cc Dr Barry Dienes Dr Harrington Challenger   Evlyn Clines, MD   10/05/2015, 7:15 PM

## 2015-10-09 ENCOUNTER — Other Ambulatory Visit: Payer: 59

## 2015-10-09 ENCOUNTER — Telehealth: Payer: Self-pay

## 2015-10-09 ENCOUNTER — Ambulatory Visit: Payer: 59 | Admitting: Oncology

## 2015-10-09 ENCOUNTER — Telehealth: Payer: Self-pay | Admitting: Oncology

## 2015-10-09 NOTE — Telephone Encounter (Signed)
appt made and calender mailed to pt 8/21

## 2015-10-09 NOTE — Telephone Encounter (Signed)
lvm that neck xrays should be scheduled soon and we will call her with the results. Also Dr Marko Plume wants to see her in 6 months, ie in February

## 2015-10-09 NOTE — Telephone Encounter (Signed)
-----   Message from Gordy Levan, MD sent at 10/08/2015  9:46 AM EDT ----- Please tell patient that I think best to keep every 6 month visits at this office. I have sent another scheduling message for this now  thanks

## 2015-10-11 ENCOUNTER — Ambulatory Visit (HOSPITAL_COMMUNITY)
Admission: RE | Admit: 2015-10-11 | Discharge: 2015-10-11 | Disposition: A | Discharge status: Home / Self Care | Payer: 59 | Source: Ambulatory Visit | Attending: Oncology | Admitting: Oncology

## 2015-10-11 DIAGNOSIS — M50323 Other cervical disc degeneration at C6-C7 level: Secondary | ICD-10-CM | POA: Diagnosis not present

## 2015-10-11 DIAGNOSIS — M542 Cervicalgia: Secondary | ICD-10-CM | POA: Diagnosis present

## 2015-10-11 DIAGNOSIS — M50322 Other cervical disc degeneration at C5-C6 level: Secondary | ICD-10-CM | POA: Insufficient documentation

## 2015-10-13 ENCOUNTER — Telehealth: Payer: Self-pay

## 2015-10-13 NOTE — Telephone Encounter (Signed)
-----   Message from Gordy Levan, MD sent at 10/13/2015  8:23 AM EDT ----- Labs seen and need follow up: please let her know some arthritis changes in cervical spine xrays including a little disc space narrowing C5-6 and C6-7. Nothing that suggests cancer in bones on these xrays. I will send report to Dr Lysle Rubens.  When RN speaks with her, let her know she should have 6 month follow up at [facility] (I see apt in EMR already)   thanks

## 2015-10-13 NOTE — Telephone Encounter (Signed)
Told Ms Gutermuth the results of the x-rays as noted below by Dr. Marko Plume.   Gave pt appointment date for 6 months = 04-08-16 at 1000.

## 2015-10-22 ENCOUNTER — Other Ambulatory Visit: Payer: Self-pay | Admitting: Oncology

## 2015-10-22 DIAGNOSIS — T451X5A Adverse effect of antineoplastic and immunosuppressive drugs, initial encounter: Principal | ICD-10-CM

## 2015-10-22 DIAGNOSIS — C50411 Malignant neoplasm of upper-outer quadrant of right female breast: Secondary | ICD-10-CM

## 2015-10-22 DIAGNOSIS — G620 Drug-induced polyneuropathy: Secondary | ICD-10-CM

## 2015-11-03 IMAGING — US US BREAST LTD UNI RIGHT INC AXILLA
1 series · 13 of 22 positions shown · non-contrast
Comparison: Previous examinations the most recent of which is dated
09/23/2013.

CLINICAL DATA: The patient has a history of a previous malignant
mastectomy of the left breast for a T2 carcinoma of the left breast
in 0442 ( she was 27 weeks pregnant at the time). Left axillary
lymph node dissection was performed as well with 11 positive left
axillary lymph nodes. The patient received chemotherapy. She also
gives a history of multiple BRCA 1 and BRCA 2 mutations. She has
undergone previous hysterectomy and bilateral oophorectomy. She has
been followed by screening mammography and breast MRI. Recent right
breast screening mammogram and breast MRI demonstrated a new oval
mass within the upper-outer quadrant of the right breast. Diagnostic
evaluation was recommended.

EXAM:
DIGITAL DIAGNOSTIC  right breast MAMMOGRAM WITH CAD
ULTRASOUND right BREAST

[Series 2: superficial breast · 13 of 22 slices shown]
[im 1/22]
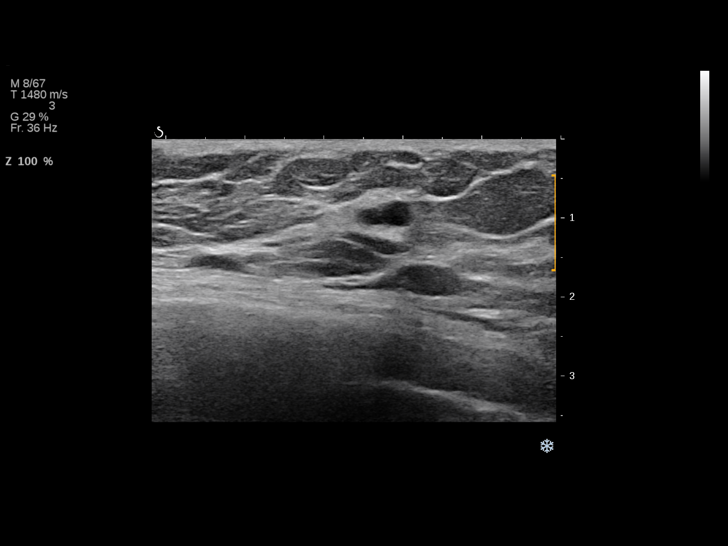
[im 3/22]
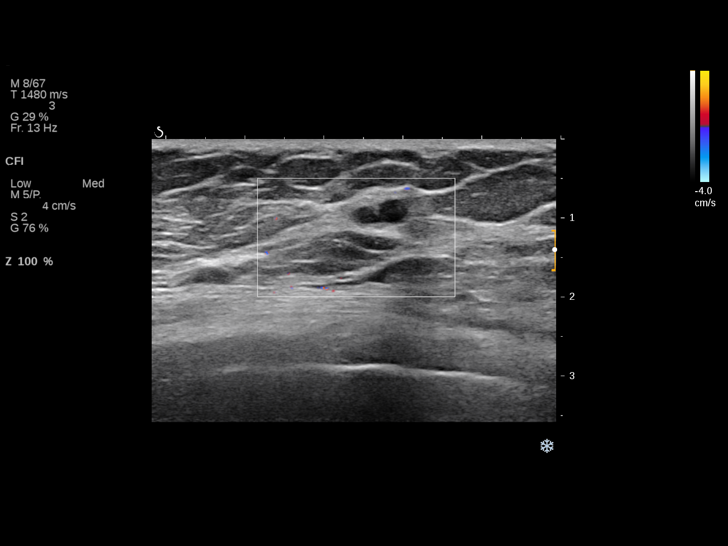
[im 5/22]
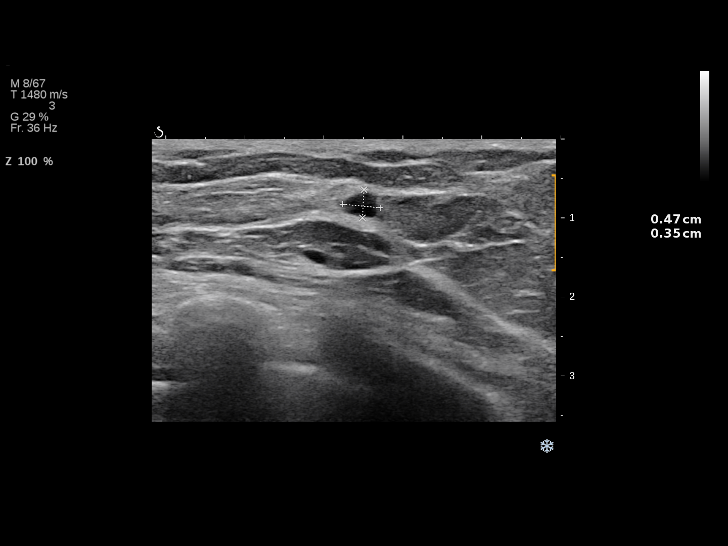
[im 6/22]
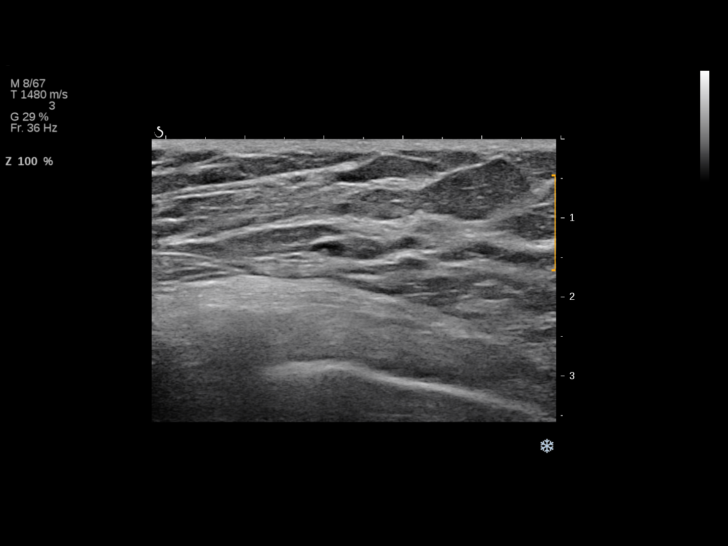
[im 8/22]
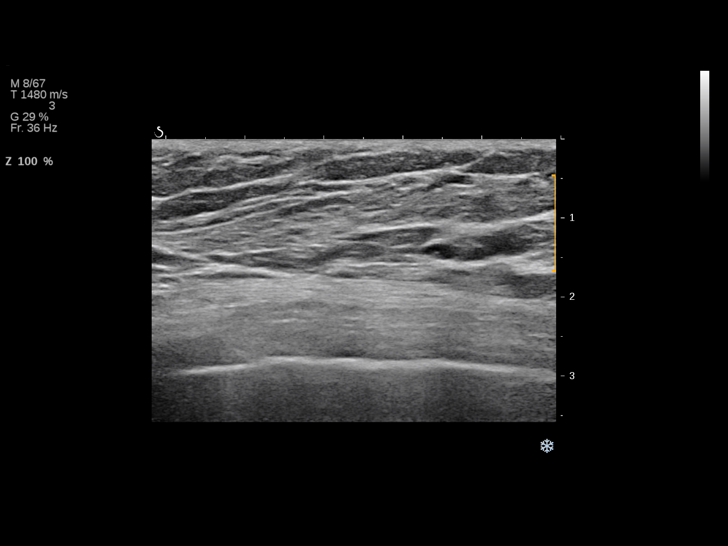
[im 10/22]
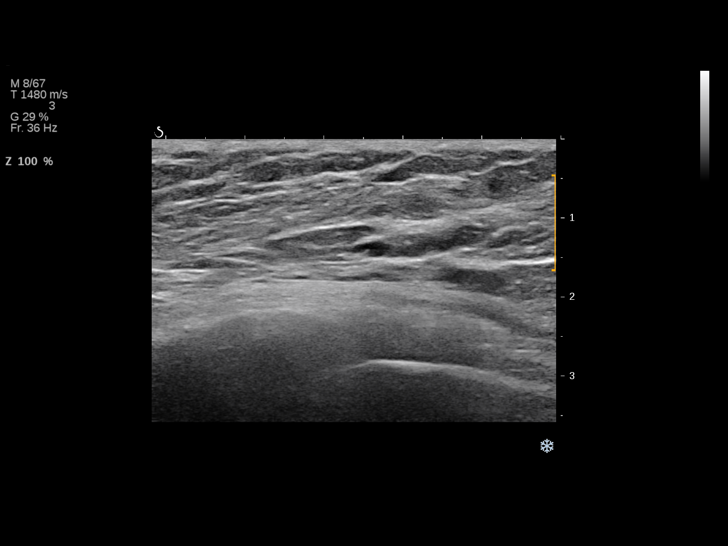
[im 12/22]
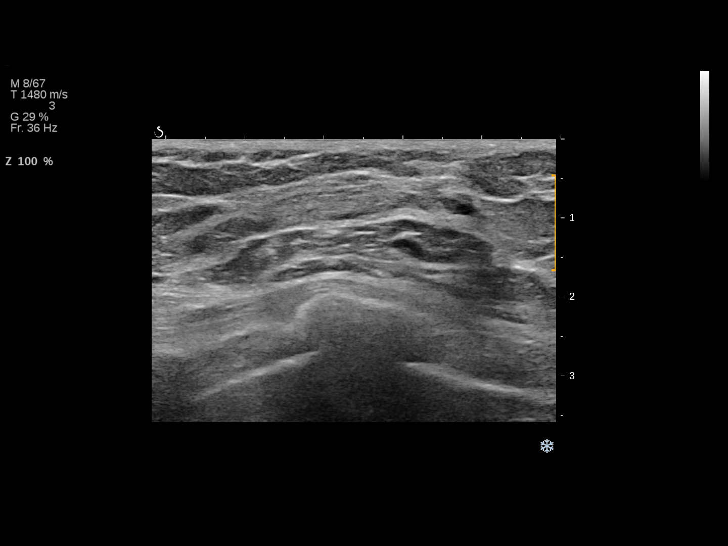
[im 13/22]
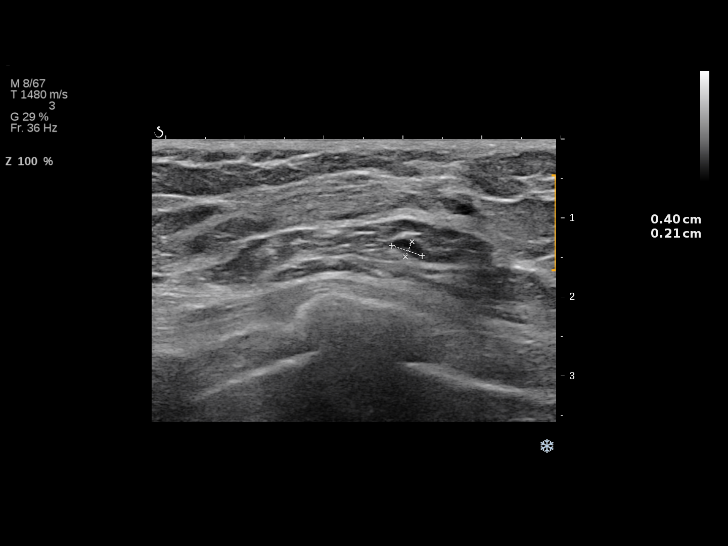
[im 15/22]
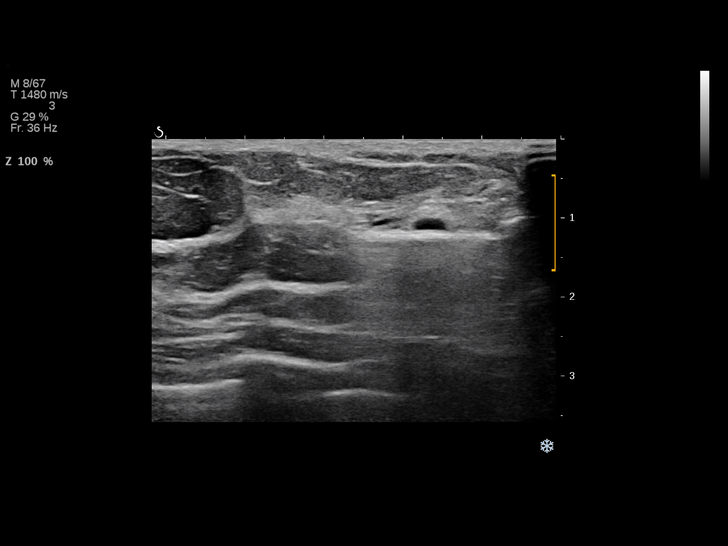
[im 17/22]
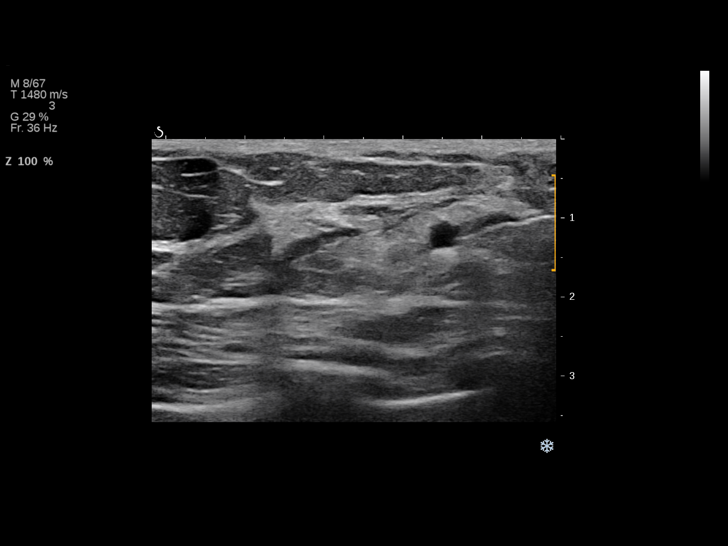
[im 18/22]
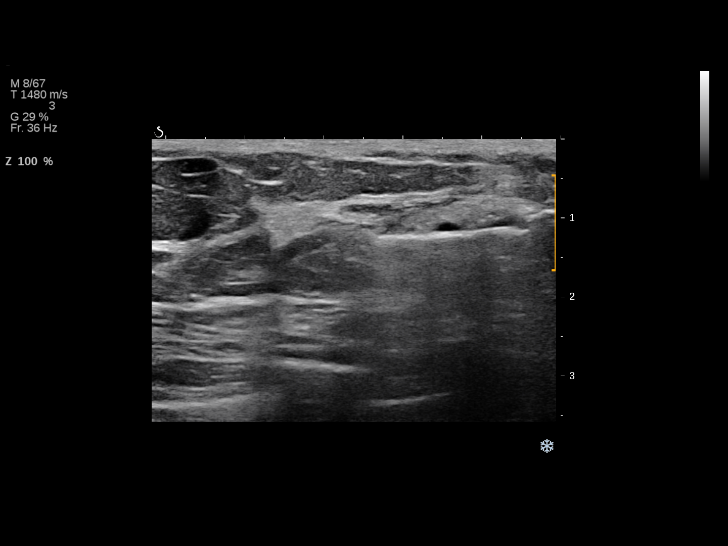
[im 20/22]
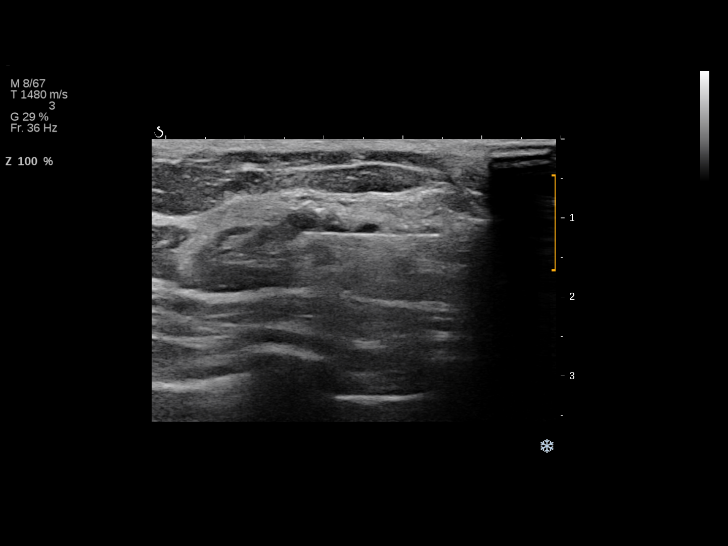
[im 22/22]
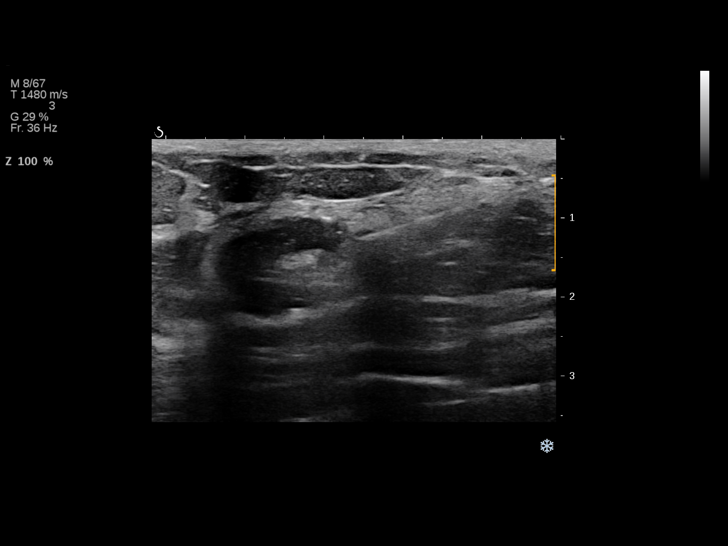

[13 of 22 positions shown; findings below may reference images not displayed]

ACR Breast Density Category c: The breast tissue is heterogeneously
dense, which may obscure small masses.
FINDINGS: There is an oval circumscribed mass located within the upper outer
quadrant of the right breast with lucency along its superior border.
Most likely this represents an intramammary lymph node which has
increased in size.

Mammographic images were processed with CAD.

On physical exam, there is no discrete palpable abnormality within
the upper outer quadrant of the right breast

Ultrasound is performed, showing there is an oval mass with a
central cleft located within the right breast at 10 o'clock position
17 cm from the nipple corresponding to the mammographic finding.
Most likely this represents an intramammary lymph node. However,
there is eccentric cortical thickening associated with this lymph
node and this does represent change when compare with prior studies.
This measures 6 x 5 x 4 mm in size. Tissue sampling is recommended.
I discussed ultrasound-guided core biopsy with clip placement of
this mass with the patient. This will be performed and reported
separately.
IMPRESSION: 6 mm suspicious mass located within the upper outer quadrant of the
right breast at the 10 o'clock position 17 cm from the nipple. Most
likely this represents an abnormal intramammary lymph node. Tissue
sampling is recommended and ultrasound-guided core biopsy will be
performed and reported separately.

RECOMMENDATION:
Right breast ultrasound-guided core biopsy.

I have discussed the findings and recommendations with the patient.
Results were also provided in writing at the conclusion of the
visit. If applicable, a reminder letter will be sent to the patient
regarding the next appointment.

BI-RADS CATEGORY  4: Suspicious.

## 2015-12-06 ENCOUNTER — Other Ambulatory Visit: Payer: Self-pay | Admitting: Oncology

## 2015-12-06 DIAGNOSIS — C50411 Malignant neoplasm of upper-outer quadrant of right female breast: Secondary | ICD-10-CM

## 2015-12-06 DIAGNOSIS — G620 Drug-induced polyneuropathy: Secondary | ICD-10-CM

## 2015-12-06 DIAGNOSIS — T451X5A Adverse effect of antineoplastic and immunosuppressive drugs, initial encounter: Principal | ICD-10-CM

## 2016-03-08 ENCOUNTER — Telehealth: Payer: Self-pay

## 2016-03-08 NOTE — Telephone Encounter (Signed)
Called pt to notify her that she will now be transitioned under Dr.Gudena's care per Dr.Livesay request. Pt appreciative of call and looking forward to meeting her new Dr on her next appt.

## 2016-03-13 ENCOUNTER — Other Ambulatory Visit: Payer: Self-pay | Admitting: Oncology

## 2016-03-13 DIAGNOSIS — G620 Drug-induced polyneuropathy: Secondary | ICD-10-CM

## 2016-03-13 DIAGNOSIS — T451X5A Adverse effect of antineoplastic and immunosuppressive drugs, initial encounter: Principal | ICD-10-CM

## 2016-03-13 DIAGNOSIS — C50411 Malignant neoplasm of upper-outer quadrant of right female breast: Secondary | ICD-10-CM

## 2016-04-08 ENCOUNTER — Ambulatory Visit: Payer: 59 | Admitting: Hematology and Oncology

## 2016-04-08 ENCOUNTER — Ambulatory Visit: Payer: 59 | Admitting: Oncology

## 2016-04-08 ENCOUNTER — Other Ambulatory Visit: Payer: 59

## 2016-04-08 ENCOUNTER — Telehealth: Payer: Self-pay

## 2016-04-08 NOTE — Telephone Encounter (Signed)
Pt called in and states that she will be unable to come to her appt today. She states that she has the flu and will call back to make an appt when she is better

## 2016-04-09 ENCOUNTER — Telehealth: Payer: Self-pay | Admitting: Hematology and Oncology

## 2016-04-09 NOTE — Telephone Encounter (Signed)
Patient needs to reschedule her appointment from yesterday she is sick with the Flu anything after the end of next week.   [phone]

## 2016-04-18 ENCOUNTER — Encounter: Payer: Self-pay | Admitting: Hematology and Oncology

## 2016-04-18 ENCOUNTER — Ambulatory Visit (HOSPITAL_BASED_OUTPATIENT_CLINIC_OR_DEPARTMENT_OTHER): Payer: 59 | Admitting: Hematology and Oncology

## 2016-04-18 DIAGNOSIS — G620 Drug-induced polyneuropathy: Secondary | ICD-10-CM | POA: Diagnosis not present

## 2016-04-18 DIAGNOSIS — Z171 Estrogen receptor negative status [ER-]: Principal | ICD-10-CM

## 2016-04-18 DIAGNOSIS — C50411 Malignant neoplasm of upper-outer quadrant of right female breast: Secondary | ICD-10-CM

## 2016-04-18 DIAGNOSIS — Z853 Personal history of malignant neoplasm of breast: Secondary | ICD-10-CM | POA: Diagnosis not present

## 2016-04-18 NOTE — Assessment & Plan Note (Signed)
Left breast cancer 1996 while she was pregnant, status post left mastectomy, adjuvant chemotherapy, autologous stem cell transplant with high-dose chemotherapy, adjuvant radiation, triple negative Right breast cancer 2015: IDC, triple negative status post right mastectomy followed by adjuvant chemotherapy with Taxotere carboplatin followed by Taxol completed 05/19/2014  Surveillance: 1. No role of imaging studies unless she has symptoms 2. chest wall and reconstructed breasts examination does not reveal any concerns for breast cancer recurrence  Chemotherapy-induced peripheral neuropathy: Currently on gabapentin Patient wants to keep the port Return to clinic in 6 months for follow-up.

## 2016-04-18 NOTE — Progress Notes (Signed)
Patient Care Team: Wenda Low, MD as PCP - General (Internal Medicine)  DIAGNOSIS:  Encounter Diagnosis  Name Primary?  . Malignant neoplasm of upper-outer quadrant of right breast in female, estrogen receptor negative ([place])     SUMMARY OF ONCOLOGIC HISTORY:   Breast cancer, left breast ([place])   07/17/1994 Initial Diagnosis    Left breast cancer when she was [redacted] weeks pregnant 4 cm poorly differentiated IDC and comedocarcinoma, ER/PR negative (HER-2 was not tested in 1996); Adriamycin 2 cycles, T2 N1b stage IIB      07/26/1994 Surgery    Left mastectomy 12/23 lymph nodes positive, IDC with DCIS      08/19/1994 - 12/20/1994 Chemotherapy    Adriamycin and Cytoxan 1 followed by 3 cycles of Adriamycin and Taxol followed by high-dose chemotherapy with Cytoxan, C PDP an autologous transplant at [place] (Dr J.Vredenburgh) , and local radiation on left       Breast cancer of upper-outer quadrant of right female breast ([place])   09/24/2013 Initial Diagnosis    Right breast cancer: 9 mm right breast UOQ, EUS biopsy: Invasive mammary cancer, triple negative, Ki-67 87%       Miscellaneous    BRCA1 and 2 variants      11/16/2013 Surgery    Right mastectomy with immediate reconstruction with a right latissimus myocutaneous flap with saline implant: Grade 3 IDC 0.6 cm 0/4 lymph nodes, T1 BN 0 stage IA      12/28/2013 - 05/17/2014 Chemotherapy    Taxotere carboplatin 4 followed by Taxol 7 stopped for neuropathy       CHIEF COMPLIANT: Establishing care with me since Dr. Mariana Kaufman retirement  INTERVAL HISTORY: KELLER MIKELS is a 56 year old with above-mentioned history of bilateral breast cancers. Her left breast cancer was diagnosed in 1996 when she was pregnant. She had chemotherapy followed by mastectomy followed by more chemotherapy for estrogen negative breast cancer. She also had autologous stem cell transplant at [facility]. She had a breast cancer in the right breast in 2015 and  underwent mastectomy with reconstruction followed by adjuvant chemotherapy. Chemotherapy was stopped early because of neuropathy related to Taxol. She is currently on Neurontin 300 mg twice a day. This appears to be keeping her symptoms under control. She denies any major problems other than mild neck discomfort especially when she bends her neck downwards and towards her axilla.  REVIEW OF SYSTEMS:   Constitutional: Denies fevers, chills or abnormal weight loss, complains of neck pain Eyes: Denies blurriness of vision Ears, nose, mouth, throat, and face: Denies mucositis or sore throat Respiratory: Denies cough, dyspnea or wheezes Cardiovascular: Denies palpitation, chest discomfort Gastrointestinal:  Denies nausea, heartburn or change in bowel habits Skin: Denies abnormal skin rashes Lymphatics: Denies new lymphadenopathy or easy bruising Neurological: Neuropathy from prior chemotherapy Behavioral/Psych: Mood is stable, no new changes  Extremities: No lower extremity edema Breast: Bilateral mastectomies with reconstruction All other systems were reviewed with the patient and are negative.  I have reviewed the past medical history, past surgical history, social history and family history with the patient and they are unchanged from previous note.  ALLERGIES:  is allergic to adhesive [tape]; aleve [naproxen]; and advil [ibuprofen].  MEDICATIONS:  Current Outpatient Prescriptions  Medication Sig Dispense Refill  . acetaminophen (TYLENOL) 500 MG tablet Take 500-1,000 mg by mouth every 6 (six) hours as needed for mild pain.     . Cholecalciferol (VITAMIN D) 2000 UNITS tablet Take 2,000 Units by mouth daily.    Marland Kitchen  gabapentin (NEURONTIN) 300 MG capsule TAKE 1 CAPSULE(300 MG) BY MOUTH THREE TIMES DAILY 90 capsule 2  . loratadine (CLARITIN) 10 MG tablet Take 10 mg by mouth daily as needed for allergies.    Marland Kitchen oxyCODONE (OXY IR/ROXICODONE) 5 MG immediate release tablet Take 1-2 tablets (5-10 mg  total) by mouth every 6 (six) hours as needed for moderate pain, severe pain or breakthrough pain. (Patient not taking: Reported on 10/05/2015) 15 tablet 0   No current facility-administered medications for this visit.     PHYSICAL EXAMINATION: ECOG PERFORMANCE STATUS: 1 - Symptomatic but completely ambulatory  Vitals:   04/18/16 1150  BP: 134/81  Pulse: 94  Resp: 18  Temp: 98.5 F (36.9 C)   Filed Weights   04/18/16 1150  Weight: 229 lb 8 oz (104.1 kg)    GENERAL:alert, no distress and comfortable SKIN: skin color, texture, turgor are normal, no rashes or significant lesions EYES: normal, Conjunctiva are pink and non-injected, sclera clear OROPHARYNX:no exudate, no erythema and lips, buccal mucosa, and tongue normal  NECK: supple, thyroid normal size, non-tender, without nodularity LYMPH:  no palpable lymphadenopathy in the cervical, axillary or inguinal LUNGS: clear to auscultation and percussion with normal breathing effort HEART: regular rate & rhythm and no murmurs and no lower extremity edema ABDOMEN:abdomen soft, non-tender and normal bowel sounds MUSCULOSKELETAL:no cyanosis of digits and no clubbing  NEURO: alert & oriented x 3 with fluent speech, no focal motor/sensory deficits EXTREMITIES: No lower extremity edema  LABORATORY DATA:  I have reviewed the data as listed   Chemistry      Component Value Date/Time   NA 139 10/05/2015 0949   K 4.5 10/05/2015 0949   CL 104 11/11/2013 1549   CO2 23 10/05/2015 0949   BUN 15.7 10/05/2015 0949   CREATININE 0.9 10/05/2015 0949      Component Value Date/Time   CALCIUM 9.7 10/05/2015 0949   ALKPHOS 79 10/05/2015 0949   AST 17 10/05/2015 0949   ALT 19 10/05/2015 0949   BILITOT 0.76 10/05/2015 0949       Lab Results  Component Value Date   WBC 4.9 10/05/2015   HGB 12.2 10/05/2015   HCT 38.2 10/05/2015   MCV 87.5 10/05/2015   PLT 302 10/05/2015   NEUTROABS 2.4 10/05/2015    ASSESSMENT & PLAN:  Breast cancer  of upper-outer quadrant of right female breast Left breast cancer 1996 while she was pregnant, status post left mastectomy, adjuvant chemotherapy, autologous stem cell transplant with high-dose chemotherapy, adjuvant radiation, triple negative Right breast cancer 2015: IDC, triple negative status post right mastectomy followed by adjuvant chemotherapy with Taxotere carboplatin followed by Taxol completed 05/19/2014  Surveillance: 1. No role of imaging studies unless she has symptoms 2. chest wall and reconstructed breasts examination does not reveal any concerns for breast cancer recurrence  Chemotherapy-induced peripheral neuropathy: Currently on gabapentin Patient wants to keep the port Return to clinic in 1 year for follow-up. Surveillance would be with periodic breast and chest wall exams. Scans only if the patient has any symptoms. Patient is expecting to retire in August 2019 and moved to [place] for retirement. She is currently working in the [employer].  Patient will call me if she has any symptoms in the interim but otherwise I'll see her back in a year for follow-up. I spent 45 minutes talking to the patient of which more than half was spent in counseling and coordination of care.  No orders of the defined types  were placed in this encounter.  The patient has a good understanding of the overall plan. she agrees with it. she will call with any problems that may develop before the next visit here.   Rulon Eisenmenger, MD 04/18/16

## 2016-04-26 DIAGNOSIS — C50912 Malignant neoplasm of unspecified site of left female breast: Secondary | ICD-10-CM | POA: Diagnosis not present

## 2016-04-26 DIAGNOSIS — C50911 Malignant neoplasm of unspecified site of right female breast: Secondary | ICD-10-CM | POA: Diagnosis not present

## 2016-08-14 ENCOUNTER — Other Ambulatory Visit: Payer: Self-pay

## 2016-08-14 DIAGNOSIS — T451X5A Adverse effect of antineoplastic and immunosuppressive drugs, initial encounter: Principal | ICD-10-CM

## 2016-08-14 DIAGNOSIS — G620 Drug-induced polyneuropathy: Secondary | ICD-10-CM

## 2016-08-14 DIAGNOSIS — C50411 Malignant neoplasm of upper-outer quadrant of right female breast: Secondary | ICD-10-CM

## 2016-08-14 MED ORDER — GABAPENTIN 300 MG PO CAPS: ORAL_CAPSULE | ORAL | 2 refills | Status: DC

## 2016-08-26 ENCOUNTER — Ambulatory Visit
Admission: RE | Admit: 2016-08-26 | Discharge: 2016-08-26 | Disposition: A | Discharge status: Home / Self Care | Payer: 59 | Source: Ambulatory Visit | Attending: Internal Medicine | Admitting: Internal Medicine

## 2016-08-26 ENCOUNTER — Other Ambulatory Visit: Payer: Self-pay | Admitting: Internal Medicine

## 2016-08-26 DIAGNOSIS — E559 Vitamin D deficiency, unspecified: Secondary | ICD-10-CM | POA: Diagnosis not present

## 2016-08-26 DIAGNOSIS — M25551 Pain in right hip: Secondary | ICD-10-CM

## 2016-08-26 DIAGNOSIS — Z1389 Encounter for screening for other disorder: Secondary | ICD-10-CM | POA: Diagnosis not present

## 2016-08-26 DIAGNOSIS — G620 Drug-induced polyneuropathy: Secondary | ICD-10-CM | POA: Diagnosis not present

## 2016-08-26 DIAGNOSIS — Z136 Encounter for screening for cardiovascular disorders: Secondary | ICD-10-CM | POA: Diagnosis not present

## 2016-08-26 DIAGNOSIS — Z0000 Encounter for general adult medical examination without abnormal findings: Secondary | ICD-10-CM | POA: Diagnosis not present

## 2016-08-26 DIAGNOSIS — R7309 Other abnormal glucose: Secondary | ICD-10-CM | POA: Diagnosis not present

## 2016-08-26 DIAGNOSIS — M5137 Other intervertebral disc degeneration, lumbosacral region: Secondary | ICD-10-CM | POA: Diagnosis not present

## 2016-09-10 DIAGNOSIS — Z01419 Encounter for gynecological examination (general) (routine) without abnormal findings: Secondary | ICD-10-CM | POA: Diagnosis not present

## 2016-10-03 ENCOUNTER — Other Ambulatory Visit: Payer: 59

## 2016-10-03 ENCOUNTER — Ambulatory Visit: Payer: 59 | Admitting: Hematology and Oncology

## 2016-10-04 ENCOUNTER — Telehealth: Payer: Self-pay

## 2016-10-04 NOTE — Telephone Encounter (Signed)
Spoke with patient and she is aware of her new appt date and time due to 04/24/17 pal  Wyat Infinger

## 2016-11-27 DIAGNOSIS — M5136 Other intervertebral disc degeneration, lumbar region: Secondary | ICD-10-CM | POA: Diagnosis not present

## 2016-11-27 DIAGNOSIS — Z23 Encounter for immunization: Secondary | ICD-10-CM | POA: Diagnosis not present

## 2016-11-27 DIAGNOSIS — M5090 Cervical disc disorder, unspecified, unspecified cervical region: Secondary | ICD-10-CM | POA: Diagnosis not present

## 2016-12-28 ENCOUNTER — Other Ambulatory Visit: Payer: Self-pay | Admitting: Hematology and Oncology

## 2016-12-28 DIAGNOSIS — C50411 Malignant neoplasm of upper-outer quadrant of right female breast: Secondary | ICD-10-CM

## 2016-12-28 DIAGNOSIS — T451X5A Adverse effect of antineoplastic and immunosuppressive drugs, initial encounter: Principal | ICD-10-CM

## 2016-12-28 DIAGNOSIS — G620 Drug-induced polyneuropathy: Secondary | ICD-10-CM

## 2017-02-15 ENCOUNTER — Other Ambulatory Visit: Payer: Self-pay | Admitting: Hematology and Oncology

## 2017-02-15 DIAGNOSIS — G620 Drug-induced polyneuropathy: Secondary | ICD-10-CM

## 2017-02-15 DIAGNOSIS — T451X5A Adverse effect of antineoplastic and immunosuppressive drugs, initial encounter: Principal | ICD-10-CM

## 2017-02-15 DIAGNOSIS — C50411 Malignant neoplasm of upper-outer quadrant of right female breast: Secondary | ICD-10-CM

## 2017-02-26 DIAGNOSIS — M542 Cervicalgia: Secondary | ICD-10-CM | POA: Diagnosis not present

## 2017-04-05 ENCOUNTER — Other Ambulatory Visit: Payer: Self-pay | Admitting: Hematology and Oncology

## 2017-04-05 DIAGNOSIS — T451X5A Adverse effect of antineoplastic and immunosuppressive drugs, initial encounter: Principal | ICD-10-CM

## 2017-04-05 DIAGNOSIS — G620 Drug-induced polyneuropathy: Secondary | ICD-10-CM

## 2017-04-05 DIAGNOSIS — C50411 Malignant neoplasm of upper-outer quadrant of right female breast: Secondary | ICD-10-CM

## 2017-04-24 ENCOUNTER — Other Ambulatory Visit: Payer: 59

## 2017-04-24 ENCOUNTER — Ambulatory Visit: Payer: 59 | Admitting: Hematology and Oncology

## 2017-04-24 DIAGNOSIS — M25551 Pain in right hip: Secondary | ICD-10-CM | POA: Diagnosis not present

## 2017-04-24 DIAGNOSIS — M542 Cervicalgia: Secondary | ICD-10-CM | POA: Diagnosis not present

## 2017-04-28 ENCOUNTER — Other Ambulatory Visit: Payer: Self-pay

## 2017-04-28 DIAGNOSIS — C50912 Malignant neoplasm of unspecified site of left female breast: Secondary | ICD-10-CM

## 2017-04-29 ENCOUNTER — Inpatient Hospital Stay: Payer: 59 | Attending: Hematology and Oncology

## 2017-04-29 ENCOUNTER — Encounter: Payer: Self-pay | Admitting: Adult Health

## 2017-04-29 ENCOUNTER — Telehealth: Payer: Self-pay | Admitting: Hematology and Oncology

## 2017-04-29 ENCOUNTER — Inpatient Hospital Stay: Payer: 59 | Admitting: Adult Health

## 2017-04-29 DIAGNOSIS — Z853 Personal history of malignant neoplasm of breast: Secondary | ICD-10-CM

## 2017-04-29 DIAGNOSIS — C50411 Malignant neoplasm of upper-outer quadrant of right female breast: Secondary | ICD-10-CM

## 2017-04-29 DIAGNOSIS — G620 Drug-induced polyneuropathy: Secondary | ICD-10-CM

## 2017-04-29 DIAGNOSIS — C50912 Malignant neoplasm of unspecified site of left female breast: Secondary | ICD-10-CM

## 2017-04-29 DIAGNOSIS — M25551 Pain in right hip: Secondary | ICD-10-CM | POA: Diagnosis not present

## 2017-04-29 DIAGNOSIS — M542 Cervicalgia: Secondary | ICD-10-CM | POA: Diagnosis not present

## 2017-04-29 DIAGNOSIS — Z171 Estrogen receptor negative status [ER-]: Principal | ICD-10-CM

## 2017-04-29 LAB — CBC WITH DIFFERENTIAL (CANCER CENTER ONLY)
Basophils Absolute: 0 10*3/uL (ref 0.0–0.1)
Basophils Relative: 0 %
EOS PCT: 1 %
Eosinophils Absolute: 0.1 10*3/uL (ref 0.0–0.5)
HCT: 36.5 % (ref 34.8–46.6)
Hemoglobin: 11.7 g/dL (ref 11.6–15.9)
LYMPHS ABS: 1.9 10*3/uL (ref 0.9–3.3)
LYMPHS PCT: 38 %
MCH: 28.1 pg (ref 25.1–34.0)
MCHC: 32.1 g/dL (ref 31.5–36.0)
MCV: 87.7 fL (ref 79.5–101.0)
MONO ABS: 0.3 10*3/uL (ref 0.1–0.9)
Monocytes Relative: 7 %
Neutro Abs: 2.7 10*3/uL (ref 1.5–6.5)
Neutrophils Relative %: 54 %
PLATELETS: 275 10*3/uL (ref 145–400)
RBC: 4.16 MIL/uL (ref 3.70–5.45)
RDW: 14.5 % (ref 11.2–14.5)
WBC Count: 5.1 10*3/uL (ref 3.9–10.3)

## 2017-04-29 LAB — CMP (CANCER CENTER ONLY)
ALBUMIN: 3.9 g/dL (ref 3.5–5.0)
ALT: 17 U/L (ref 0–55)
AST: 15 U/L (ref 5–34)
Alkaline Phosphatase: 77 U/L (ref 40–150)
Anion gap: 10 (ref 3–11)
BILIRUBIN TOTAL: 0.4 mg/dL (ref 0.2–1.2)
BUN: 19 mg/dL (ref 7–26)
CO2: 23 mmol/L (ref 22–29)
Calcium: 9.3 mg/dL (ref 8.4–10.4)
Chloride: 108 mmol/L (ref 98–109)
Creatinine: 0.94 mg/dL (ref 0.60–1.10)
GFR, Est AFR Am: >60 mL/min (ref >60)
GFR, Estimated: >60 mL/min (ref >60)
GLUCOSE: 104 mg/dL (ref 70–140)
POTASSIUM: 3.8 mmol/L (ref 3.5–5.1)
Sodium: 141 mmol/L (ref 136–145)
TOTAL PROTEIN: 7.4 g/dL (ref 6.4–8.3)

## 2017-04-29 NOTE — Telephone Encounter (Signed)
Gave patient AVs and calendar of upcoming march 2020 appointments.  °

## 2017-04-29 NOTE — Progress Notes (Signed)
Patient Care Team: Wenda Low, MD as PCP - General (Internal Medicine)  DIAGNOSIS:  Encounter Diagnosis  Name Primary?  . Malignant neoplasm of upper-outer quadrant of right breast in female, estrogen receptor negative ([place])     SUMMARY OF ONCOLOGIC HISTORY:   Breast cancer, left breast ([place])   07/17/1994 Initial Diagnosis    Left breast cancer when she was [redacted] weeks pregnant 4 cm poorly differentiated IDC and comedocarcinoma, ER/PR negative (HER-2 was not tested in 1996); Adriamycin 2 cycles, T2 N1b stage IIB      07/26/1994 Surgery    Left mastectomy 12/23 lymph nodes positive, IDC with DCIS      08/19/1994 - 12/20/1994 Chemotherapy    Adriamycin and Cytoxan 1 followed by 3 cycles of Adriamycin and Taxol followed by high-dose chemotherapy with Cytoxan, C PDP an autologous transplant at [place] (Dr J.Vredenburgh) , and local radiation on left       Breast cancer of upper-outer quadrant of right female breast ([place])   09/24/2013 Initial Diagnosis    Right breast cancer: 9 mm right breast UOQ, EUS biopsy: Invasive mammary cancer, triple negative, Ki-67 87%       Miscellaneous    BRCA1 and 2 variants      11/16/2013 Surgery    Right mastectomy with immediate reconstruction with a right latissimus myocutaneous flap with saline implant: Grade 3 IDC 0.6 cm 0/4 lymph nodes, T1 BN 0 stage IA      12/28/2013 - 05/17/2014 Chemotherapy    Taxotere carboplatin 4 followed by Taxol 7 stopped for neuropathy       CHIEF COMPLIANT: Surveillance of breast cancer  INTERVAL HISTORY: Heidi Sexton is a 57 year old with above-mentioned history of bilateral breast cancers who is currently in surveillance.  She reports no new problems or concerns.  She continues to have neuropathy related to prior chemotherapy.  She is looking forward to retirement and selling her house so she can go to [place].  REVIEW OF SYSTEMS:   Constitutional: Denies fevers, chills or abnormal weight  loss Eyes: Denies blurriness of vision Ears, nose, mouth, throat, and face: Denies mucositis or sore throat Respiratory: Denies cough, dyspnea or wheezes Cardiovascular: Denies palpitation, chest discomfort Gastrointestinal:  Denies nausea, heartburn or change in bowel habits Skin: Denies abnormal skin rashes Lymphatics: Denies new lymphadenopathy or easy bruising Neurological:Denies numbness, tingling or new weaknesses Behavioral/Psych: Mood is stable, no new changes  Extremities: No lower extremity edema Breast:  denies any pain or lumps or nodules in either breasts All other systems were reviewed with the patient and are negative.  I have reviewed the past medical history, past surgical history, social history and family history with the patient and they are unchanged from previous note.  ALLERGIES:  is allergic to adhesive [tape]; aleve [naproxen]; and advil [ibuprofen].  MEDICATIONS:  Current Outpatient Medications  Medication Sig Dispense Refill  . acetaminophen (TYLENOL) 500 MG tablet Take 500-1,000 mg by mouth every 6 (six) hours as needed for mild pain.     . Cholecalciferol (VITAMIN D) 2000 UNITS tablet Take 2,000 Units by mouth daily.    Marland Kitchen gabapentin (NEURONTIN) 300 MG capsule TAKE 1 CAPSULE(300 MG) BY MOUTH THREE TIMES DAILY 90 capsule 0  . loratadine (CLARITIN) 10 MG tablet Take 10 mg by mouth daily as needed for allergies.    Marland Kitchen oxyCODONE (OXY IR/ROXICODONE) 5 MG immediate release tablet Take 1-2 tablets (5-10 mg total) by mouth every 6 (six) hours as needed for moderate pain, severe  pain or breakthrough pain. (Patient not taking: Reported on 10/05/2015) 15 tablet 0   No current facility-administered medications for this visit.     PHYSICAL EXAMINATION: ECOG PERFORMANCE STATUS: 1 - Symptomatic but completely ambulatory  Vitals:   04/29/17 1530  BP: (!) 121/57  Pulse: 78  Resp: 18  Temp: 98.7 F (37.1 C)  SpO2: 100%   Filed Weights   04/29/17 1530  Weight: 231  lb (104.8 kg)    GENERAL:alert, no distress and comfortable SKIN: skin color, texture, turgor are normal, no rashes or significant lesions EYES: normal, Conjunctiva are pink and non-injected, sclera clear OROPHARYNX:no exudate, no erythema and lips, buccal mucosa, and tongue normal  NECK: supple, thyroid normal size, non-tender, without nodularity LYMPH:  no palpable lymphadenopathy in the cervical, axillary or inguinal LUNGS: clear to auscultation and percussion with normal breathing effort HEART: regular rate & rhythm and no murmurs and no lower extremity edema ABDOMEN:abdomen soft, non-tender and normal bowel sounds MUSCULOSKELETAL:no cyanosis of digits and no clubbing  NEURO: alert & oriented x 3 with fluent speech, no focal motor/sensory deficits EXTREMITIES: No lower extremity edema BREAST: No palpable masses or nodules in either right or left breasts. No palpable axillary supraclavicular or infraclavicular adenopathy no breast tenderness or nipple discharge. (exam performed in the presence of a chaperone)  LABORATORY DATA:  I have reviewed the data as listed CMP Latest Ref Rng & Units 10/05/2015 08/17/2015 01/19/2015  Glucose 70 - 140 mg/dl 121 125 114  BUN 7.0 - 26.0 mg/dL 15.7 16.1 16.0  Creatinine 0.6 - 1.1 mg/dL 0.9 0.9 0.9  Sodium 136 - 145 mEq/L 139 141 140  Potassium 3.5 - 5.1 mEq/L 4.5 4.2 4.0  Chloride 96 - 112 mEq/L - - -  CO2 22 - 29 mEq/L 23 22 25   Calcium 8.4 - 10.4 mg/dL 9.7 9.8 9.6  Total Protein 6.4 - 8.3 g/dL 7.6 7.6 7.4  Total Bilirubin 0.20 - 1.20 mg/dL 0.76 0.74 0.68  Alkaline Phos 40 - 150 U/L 79 71 74  AST 5 - 34 U/L 17 16 16   ALT 0 - 55 U/L 19 18 15     Lab Results  Component Value Date   WBC 5.1 04/29/2017   HGB 12.2 10/05/2015   HCT 36.5 04/29/2017   MCV 87.7 04/29/2017   PLT 275 04/29/2017   NEUTROABS 2.7 04/29/2017    ASSESSMENT & PLAN:  Breast cancer of upper-outer quadrant of right female breast Left breast cancer 1996 while she was  pregnant, status post left mastectomy, adjuvant chemotherapy, autologous stem cell transplant with high-dose chemotherapy, adjuvant radiation, triple negative Right breast cancer 2015: IDC, triple negative status post right mastectomy followed by adjuvant chemotherapy with Taxotere carboplatin followed by Taxol completed 05/19/2014  Surveillance: 1. No role of imaging studies unless she has symptoms 2. chest wall and reconstructed breasts examination does not reveal any concerns for breast cancer recurrence  Chemotherapy-induced peripheral neuropathy: Currently on gabapentin  Patient is expecting to retire in August 2019 and moved to [place] for retirement. She is currently working in the [place] [place].  Return to clinic in 1 year if she is still in [place]   I spent 25 minutes talking to the patient of which more than half was spent in counseling and coordination of care.  No orders of the defined types were placed in this encounter.  The patient has a good understanding of the overall plan. she agrees with it. she will call with any problems that may  develop before the next visit here.   Harriette Ohara, MD 04/29/17

## 2017-04-29 NOTE — Assessment & Plan Note (Addendum)
Left breast cancer 1996 while she was pregnant, status post left mastectomy, adjuvant chemotherapy, autologous stem cell transplant with high-dose chemotherapy, adjuvant radiation, triple negative Right breast cancer 2015: IDC, triple negative status post right mastectomy followed by adjuvant chemotherapy with Taxotere carboplatin followed by Taxol completed 05/19/2014  Surveillance: 1. No role of imaging studies unless she has symptoms 2. chest wall and reconstructed breasts examination does not reveal any concerns for breast cancer recurrence  Chemotherapy-induced peripheral neuropathy: Currently on gabapentin and this is controlled.    Patient is expecting to retire in August 2019 and moved to [place] for retirement. She is currently working in the [place].  I recommended healthy diet and regular exercise to her.  Return to clinic in 1 year.    Patient saw Dr. Lindi Adie today as well who reviewed the above mentioned plan with her.

## 2017-04-29 NOTE — Progress Notes (Addendum)
[facility]:    Wenda Low, MD [address] [place] [address]   DIAGNOSIS: Cancer Staging Breast cancer of upper-outer quadrant of right female breast ([place]) Staging form: Breast, AJCC 7th Edition - Clinical: Stage IA (T1b, N0, cM0) - Signed by Gordy Levan, MD on 12/11/2013 Prognostic indicators: Triple negative   - Pathologic: Stage IA (T1b, N0(i-), cM0) - Signed by Gordy Levan, MD on 12/11/2013 Prognostic indicators: Triple negative    Breast cancer, left breast (HCC) Staging form: Breast, AJCC 7th Edition - Clinical: Stage IIB (T2, N1, cM0) - Signed by Gordy Levan, MD on 12/11/2013 - Pathologic: No stage assigned - Unsigned   SUMMARY OF ONCOLOGIC HISTORY:   Breast cancer, left breast ([facility])   07/17/1994 Initial Diagnosis    Left breast cancer when she was [redacted] weeks pregnant 4 cm poorly differentiated IDC and comedocarcinoma, ER/PR negative (HER-2 was not tested in 1996); Adriamycin 2 cycles, T2 N1b stage IIB      07/26/1994 Surgery    Left mastectomy 12/23 lymph nodes positive, IDC with DCIS      08/19/1994 - 12/20/1994 Chemotherapy    Adriamycin and Cytoxan 1 followed by 3 cycles of Adriamycin and Taxol followed by high-dose chemotherapy with Cytoxan, C PDP an autologous transplant at [facility] (Dr J.Vredenburgh) , and local radiation on left       Breast cancer of upper-outer quadrant of right female breast ([place])   09/24/2013 Initial Diagnosis    Right breast cancer: 9 mm right breast UOQ, EUS biopsy: Invasive mammary cancer, triple negative, Ki-67 87%       Miscellaneous    BRCA1 and 2 variants      11/16/2013 Surgery    Right mastectomy with immediate reconstruction with a right latissimus myocutaneous flap with saline implant: Grade 3 IDC 0.6 cm 0/4 lymph nodes, T1 BN 0 stage IA      12/28/2013 - 05/17/2014 Chemotherapy    Taxotere carboplatin 4 followed by Taxol 7 stopped for neuropathy        CURRENT THERAPY: Observation  INTERVAL HISTORY: KEINA MUTCH 57 y.o. female returns for evaluation today for her history of breast cancer.  She is doing well.  She is about to undergo physical therapy for her back and neck pain.  This has been evaluated with imaging.  She is not currently exercising, but is retiring in August, and plans to do more at this time.  She also needs to work on recovering her back and neck.  Otherwise, she is doing well today and is without any questions or concerns.    Patient Active Problem List   Diagnosis Date Noted  . Port catheter in place 04/15/2015  . Obesity (BMI 35.0-39.9 without comorbidity) 04/15/2015  . Malignant neoplasm of left female breast ([place]) 11/19/2014  . Chemotherapy-induced peripheral neuropathy ([place]) 11/19/2014  . Breast cancer, BRCA2 positive ([place]) 06/07/2014  . Breast cancer, BRCA1 positive ([place]) 06/07/2014  . History of left breast cancer 05/18/2014  . Leukopenia due to antineoplastic chemotherapy ([place]) 04/13/2014  . Chemotherapy-induced neuropathy ([place]) 04/13/2014  . Anemia associated with chemotherapy 04/13/2014  . Portacath in place 04/13/2014  . S/P breast reconstruction, bilateral 03/22/2014  . Muscle spasm 03/22/2014  . History of herpes zoster 02/01/2014  . S/P total hysterectomy and BSO (bilateral salpingo-oophorectomy) 12/11/2013  . BRCA1 genetic carrier 11/23/2013  . Breast cancer ([place]) 11/16/2013  . Breast cancer of upper-outer quadrant of right female  breast ([place]) 10/11/2013  . Breast cancer, left breast ([place]) 09/02/2011  . BRCA positive 09/02/2011  . H/O hysterectomy with oophorectomy 09/02/2011    is allergic to adhesive [tape]; aleve [naproxen]; and advil [ibuprofen].  MEDICAL HISTORY: Past Medical History:  Diagnosis Date  . Anxiety    h/o panic attacks - relative to migranie headaches -pt denies? anxiety related to Chemotherapy  . Breast cancer [facility])    "both breasts" 4'16- Cancer rt.breast  '2015-with chemo last Tx. 4'16-Dr. Livesay follows.  . Family history of anesthesia complication    "daughter got nauseated also"  . H/O seasonal allergies    OTC  . Heart murmur    during pregnancy only- then resolved   . Migraine    "maybe 2/yr" (11/16/2013)  . PONV (postoperative nausea and vomiting)   . Wears glasses     SURGICAL HISTORY: Past Surgical History:  Procedure Laterality Date  . ABDOMINAL HYSTERECTOMY  2011   bso  . BREAST BIOPSY Left 1996  . BREAST BIOPSY Right 09/2013  . BREAST RECONSTRUCTION Left 2001  . BREAST RECONSTRUCTION Right 11/16/2013   Procedure: BREAST RECONSTRUCTION;  Surgeon: Crissie Reese, MD;  Location: [facility];  Service: Plastics;  Laterality: Right;  . BREAST SURGERY  1996   left mast-axillary node dissection  . COLONOSCOPY WITH PROPOFOL N/A 06/05/2015   Procedure: COLONOSCOPY WITH PROPOFOL;  Surgeon: Garlan Fair, MD;  [facility];  Service: Endoscopy;  Laterality: N/A;  . LATISSIMUS FLAP TO BREAST Right 11/16/2013   Procedure: LATISSIMUS FLAP TO BREAST WITH SALINE IMPLANTS;  Surgeon: Crissie Reese, MD;  Location: [facility];  Service: Plastics;  Laterality: Right;/ Tram Flap on Left.  Marland Kitchen MASTECTOMY Left 1996  . MASTECTOMY COMPLETE / SIMPLE W/ SENTINEL NODE BIOPSY Right 11/16/2013  . MASTECTOMY W/ SENTINEL NODE BIOPSY Right 11/16/2013   Procedure: RIGHT MASTECTOMY WITH SENTINEL LYMPH NODE BIOPSY;  Surgeon: [name], MD;  Location: [facility];  Service: General;  Laterality: Right;  . PORT-A-CATH REMOVAL  1996  . PORT-A-CATH REMOVAL Right 09/26/2015   Procedure: REMOVAL PORT-A-CATH;  Surgeon: [name], MD;  Location: [facility];  Service: General;  Laterality: Right;  . [place]  . PORTACATH PLACEMENT Right 12/27/2013   Procedure: INSERTION PORT-A-CATH;  Surgeon: [name], MD;  Location: [facility];  Service: General;  Laterality: Right;  . RECONSTRUCTION BREAST W/ LATISSIMUS DORSI FLAP Right  11/16/2013   w/saline implants  . TUNNELED VENOUS CATHETER PLACEMENT  1996   "[facility]; removed later in 1996"    SOCIAL HISTORY: Social History   Socioeconomic History  . Marital status: Married    Spouse name: Not on file  . Number of children: Not on file  . Years of education: Not on file  . Highest education level: Not on file  Social Needs  . Financial resource strain: Not on file  . Food insecurity - worry: Not on file  . Food insecurity - inability: Not on file  . Transportation needs - medical: Not on file  . Transportation needs - non-medical: Not on file  Occupational History  . Not on file  Tobacco Use  . Smoking status: Former Smoker    Packs/day: 0.10    Years: 15.00    Pack years: 1.50    Types: Cigarettes    Last attempt to quit: 11/11/1993    Years since quitting: 23.4  . Smokeless tobacco: Never Used  Substance and Sexual Activity  . Alcohol use: Yes  Comment: 11/16/2013 "glass of wine maybe twice/month, if that"  . Drug use: No  . Sexual activity: Not Currently  Other Topics Concern  . Not on file  Social History Narrative  . Not on file    FAMILY HISTORY: Family History  Problem Relation Age of Onset  . Liver cancer Mother     Review of Systems  Constitutional: Negative for appetite change, chills, fatigue, fever and unexpected weight change.  HENT:   Negative for hearing loss, lump/mass, sore throat and trouble swallowing.   Eyes: Negative for eye problems and icterus.  Respiratory: Negative for chest tightness, cough and shortness of breath.   Cardiovascular: Negative for chest pain, leg swelling and palpitations.  Gastrointestinal: Negative for abdominal distention, abdominal pain, constipation, diarrhea, nausea and vomiting.  Endocrine: Negative for hot flashes.  Musculoskeletal: Negative for arthralgias.  Skin: Negative for itching.  Neurological: Negative for dizziness, extremity weakness, headaches and numbness.  Hematological:  Negative for adenopathy. Does not bruise/bleed easily.  Psychiatric/Behavioral: Negative for depression. The patient is not nervous/anxious.       PHYSICAL EXAMINATION  ECOG PERFORMANCE STATUS: 1 - Symptomatic but completely ambulatory  Vitals:   04/29/17 1530  BP: (!) 121/57  Pulse: 78  Resp: 18  Temp: 98.7 F (37.1 C)  SpO2: 100%    Physical Exam  Constitutional: She is oriented to person, place, and time and well-developed, well-nourished, and in no distress.  HENT:  Head: Normocephalic and atraumatic.  Mouth/Throat: No oropharyngeal exudate.  Eyes: Pupils are equal, round, and reactive to light. No scleral icterus.  Neck: Neck supple.  Cardiovascular: Normal rate, regular rhythm and normal heart sounds.  Pulmonary/Chest: Effort normal and breath sounds normal.  Bilateral breasts s/p mastectomy and reconstruction, no nodules, masses or sign of recurrence  Abdominal: Soft. Bowel sounds are normal. She exhibits no distension and no mass. There is no tenderness. There is no rebound and no guarding.  Musculoskeletal: She exhibits no edema.  Lymphadenopathy:    She has no cervical adenopathy.  Neurological: She is alert and oriented to person, place, and time.  Skin: Skin is warm and dry.  Psychiatric: Mood and affect normal.    LABORATORY DATA:  CBC    Component Value Date/Time   WBC 5.1 04/29/2017 1513   WBC 4.9 10/05/2015 0949   WBC 4.6 11/11/2013 1549   RBC 4.16 04/29/2017 1513   HGB 12.2 10/05/2015 0949   HCT 36.5 04/29/2017 1513   HCT 38.2 10/05/2015 0949   PLT 275 04/29/2017 1513   PLT 302 10/05/2015 0949   MCV 87.7 04/29/2017 1513   MCV 87.5 10/05/2015 0949   MCH 28.1 04/29/2017 1513   MCHC 32.1 04/29/2017 1513   RDW 14.5 04/29/2017 1513   RDW 14.5 10/05/2015 0949   LYMPHSABS 1.9 04/29/2017 1513   LYMPHSABS 1.8 10/05/2015 0949   MONOABS 0.3 04/29/2017 1513   MONOABS 0.5 10/05/2015 0949   EOSABS 0.1 04/29/2017 1513   EOSABS 0.1 10/05/2015 0949    BASOSABS 0.0 04/29/2017 1513   BASOSABS 0.0 10/05/2015 0949    CMP     Component Value Date/Time   NA 141 04/29/2017 1513   NA 139 10/05/2015 0949   K 3.8 04/29/2017 1513   K 4.5 10/05/2015 0949   CL 108 04/29/2017 1513   CO2 23 04/29/2017 1513   CO2 23 10/05/2015 0949   GLUCOSE 104 04/29/2017 1513   GLUCOSE 121 10/05/2015 0949   BUN 19 04/29/2017 1513   BUN 15.7 10/05/2015  0949   CREATININE 0.94 04/29/2017 1513   CREATININE 0.9 10/05/2015 0949   CALCIUM 9.3 04/29/2017 1513   CALCIUM 9.7 10/05/2015 0949   PROT 7.4 04/29/2017 1513   PROT 7.6 10/05/2015 0949   ALBUMIN 3.9 04/29/2017 1513   ALBUMIN 3.8 10/05/2015 0949   AST 15 04/29/2017 1513   AST 17 10/05/2015 0949   ALT 17 04/29/2017 1513   ALT 19 10/05/2015 0949   ALKPHOS 77 04/29/2017 1513   ALKPHOS 79 10/05/2015 0949   BILITOT 0.4 04/29/2017 1513   BILITOT 0.76 10/05/2015 0949   GFRNONAA >60 04/29/2017 1513   GFRAA >60 04/29/2017 1513       PENDING LABS:   RADIOGRAPHIC STUDIES:  No results found.   PATHOLOGY:     ASSESSMENT and THERAPY PLAN:   Breast cancer of upper-outer quadrant of right female breast Left breast cancer 1996 while she was pregnant, status post left mastectomy, adjuvant chemotherapy, autologous stem cell transplant with high-dose chemotherapy, adjuvant radiation, triple negative Right breast cancer 2015: IDC, triple negative status post right mastectomy followed by adjuvant chemotherapy with Taxotere carboplatin followed by Taxol completed 05/19/2014  Surveillance: 1. No role of imaging studies unless she has symptoms 2. chest wall and reconstructed breasts examination does not reveal any concerns for breast cancer recurrence  Chemotherapy-induced peripheral neuropathy: Currently on gabapentin and this is controlled.    Patient is expecting to retire in August 2019 and moved to [place] for retirement. She is currently working in the [employer].  I recommended  healthy diet and regular exercise to her.  Return to clinic in 1 year.    Patient saw Dr. Lindi Adie today as well who reviewed the above mentioned plan with her.     All questions were answered. The patient knows to call the clinic with any problems, questions or concerns. We can certainly see the patient much sooner if necessary.  A total of (30) minutes of face-to-face time was spent with this patient with greater than 50% of that time in counseling and care-coordination.  This note was electronically signed. Scot Dock, NP 04/29/2017   Attending Note  I personally saw and examined Susa Raring. The plan of care was discussed with her. I agree with the assessment and plan as documented above. Chemotherapy-induced peripheral neuropathy: Stable Breast exam did not show any evidence of recurrence. Continue with annual surveillance. Signed Harriette Ohara, MD

## 2017-05-13 DIAGNOSIS — M25551 Pain in right hip: Secondary | ICD-10-CM | POA: Diagnosis not present

## 2017-05-13 DIAGNOSIS — M542 Cervicalgia: Secondary | ICD-10-CM | POA: Diagnosis not present

## 2017-05-21 ENCOUNTER — Other Ambulatory Visit: Payer: Self-pay | Admitting: Hematology and Oncology

## 2017-05-21 DIAGNOSIS — G620 Drug-induced polyneuropathy: Secondary | ICD-10-CM

## 2017-05-21 DIAGNOSIS — C50411 Malignant neoplasm of upper-outer quadrant of right female breast: Secondary | ICD-10-CM

## 2017-05-21 DIAGNOSIS — T451X5A Adverse effect of antineoplastic and immunosuppressive drugs, initial encounter: Principal | ICD-10-CM

## 2017-05-27 DIAGNOSIS — M25551 Pain in right hip: Secondary | ICD-10-CM | POA: Diagnosis not present

## 2017-05-27 DIAGNOSIS — M542 Cervicalgia: Secondary | ICD-10-CM | POA: Diagnosis not present

## 2017-06-18 DIAGNOSIS — M25551 Pain in right hip: Secondary | ICD-10-CM | POA: Diagnosis not present

## 2017-06-18 DIAGNOSIS — M542 Cervicalgia: Secondary | ICD-10-CM | POA: Diagnosis not present

## 2017-07-02 DIAGNOSIS — M542 Cervicalgia: Secondary | ICD-10-CM | POA: Diagnosis not present

## 2017-07-02 DIAGNOSIS — M25551 Pain in right hip: Secondary | ICD-10-CM | POA: Diagnosis not present

## 2017-08-15 ENCOUNTER — Ambulatory Visit
Admission: RE | Admit: 2017-08-15 | Discharge: 2017-08-15 | Disposition: A | Discharge status: Home / Self Care | Payer: Worker's Compensation | Source: Ambulatory Visit | Attending: Nurse Practitioner | Admitting: Nurse Practitioner

## 2017-08-15 ENCOUNTER — Other Ambulatory Visit: Payer: Self-pay | Admitting: Nurse Practitioner

## 2017-08-15 DIAGNOSIS — T1490XA Injury, unspecified, initial encounter: Secondary | ICD-10-CM

## 2017-08-18 DIAGNOSIS — M79641 Pain in right hand: Secondary | ICD-10-CM | POA: Diagnosis not present

## 2017-08-18 DIAGNOSIS — S52124A Nondisplaced fracture of head of right radius, initial encounter for closed fracture: Secondary | ICD-10-CM | POA: Diagnosis not present

## 2017-08-18 DIAGNOSIS — M25521 Pain in right elbow: Secondary | ICD-10-CM | POA: Diagnosis not present

## 2017-08-20 DIAGNOSIS — S52121A Displaced fracture of head of right radius, initial encounter for closed fracture: Secondary | ICD-10-CM | POA: Diagnosis not present

## 2017-08-27 DIAGNOSIS — Z23 Encounter for immunization: Secondary | ICD-10-CM | POA: Diagnosis not present

## 2017-08-27 DIAGNOSIS — C50411 Malignant neoplasm of upper-outer quadrant of right female breast: Secondary | ICD-10-CM | POA: Diagnosis not present

## 2017-08-27 DIAGNOSIS — Z0000 Encounter for general adult medical examination without abnormal findings: Secondary | ICD-10-CM | POA: Diagnosis not present

## 2017-08-27 DIAGNOSIS — G620 Drug-induced polyneuropathy: Secondary | ICD-10-CM | POA: Diagnosis not present

## 2017-08-27 DIAGNOSIS — E559 Vitamin D deficiency, unspecified: Secondary | ICD-10-CM | POA: Diagnosis not present

## 2017-08-27 DIAGNOSIS — R7303 Prediabetes: Secondary | ICD-10-CM | POA: Diagnosis not present

## 2017-09-05 DIAGNOSIS — M25521 Pain in right elbow: Secondary | ICD-10-CM | POA: Diagnosis not present

## 2017-09-15 DIAGNOSIS — M25521 Pain in right elbow: Secondary | ICD-10-CM | POA: Diagnosis not present

## 2017-09-15 DIAGNOSIS — S52124D Nondisplaced fracture of head of right radius, subsequent encounter for closed fracture with routine healing: Secondary | ICD-10-CM | POA: Diagnosis not present

## 2017-11-07 DIAGNOSIS — Z23 Encounter for immunization: Secondary | ICD-10-CM | POA: Diagnosis not present

## 2017-11-26 ENCOUNTER — Other Ambulatory Visit: Payer: Self-pay | Admitting: Hematology and Oncology

## 2017-11-26 DIAGNOSIS — C50411 Malignant neoplasm of upper-outer quadrant of right female breast: Secondary | ICD-10-CM

## 2017-11-26 DIAGNOSIS — G620 Drug-induced polyneuropathy: Secondary | ICD-10-CM

## 2017-11-26 DIAGNOSIS — T451X5A Adverse effect of antineoplastic and immunosuppressive drugs, initial encounter: Principal | ICD-10-CM

## 2018-01-18 ENCOUNTER — Other Ambulatory Visit: Payer: Self-pay | Admitting: Hematology and Oncology

## 2018-01-18 DIAGNOSIS — G620 Drug-induced polyneuropathy: Secondary | ICD-10-CM

## 2018-01-18 DIAGNOSIS — T451X5A Adverse effect of antineoplastic and immunosuppressive drugs, initial encounter: Principal | ICD-10-CM

## 2018-01-18 DIAGNOSIS — C50411 Malignant neoplasm of upper-outer quadrant of right female breast: Secondary | ICD-10-CM

## 2018-04-28 ENCOUNTER — Inpatient Hospital Stay: Payer: 59 | Admitting: Hematology and Oncology

## 2018-04-28 NOTE — Assessment & Plan Note (Signed)
Left breast [phone] while she was pregnant, status post left mastectomy, adjuvant chemotherapy, autologous stem cell transplant with high-dose chemotherapy, adjuvant radiation, triple negative Right breast cancer2015: IDC, triple negative status post right mastectomy followed by adjuvant chemotherapy with Taxotere carboplatin followed by Taxol completed 05/19/2014  Surveillance: 1.No role of imaging studies unless she has symptoms 2.chest wall and reconstructed breasts examination does not reveal any concerns for breast cancer recurrence  Chemotherapy-induced peripheral neuropathy: Currently on gabapentin  Patient is expecting to retire in August 2019 and moved to [place] for retirement. She is currently working in the [employer].  Return to clinic in 1 year if she is still in [place]

## 2018-06-13 ENCOUNTER — Other Ambulatory Visit: Payer: Self-pay | Admitting: Hematology and Oncology

## 2018-06-13 DIAGNOSIS — T451X5A Adverse effect of antineoplastic and immunosuppressive drugs, initial encounter: Principal | ICD-10-CM

## 2018-06-13 DIAGNOSIS — C50411 Malignant neoplasm of upper-outer quadrant of right female breast: Secondary | ICD-10-CM

## 2018-06-13 DIAGNOSIS — G620 Drug-induced polyneuropathy: Secondary | ICD-10-CM

## 2018-10-19 ENCOUNTER — Other Ambulatory Visit: Payer: Self-pay | Admitting: Hematology and Oncology

## 2018-10-19 DIAGNOSIS — C50411 Malignant neoplasm of upper-outer quadrant of right female breast: Secondary | ICD-10-CM

## 2018-10-19 DIAGNOSIS — G620 Drug-induced polyneuropathy: Secondary | ICD-10-CM

## 2019-03-02 ENCOUNTER — Telehealth: Payer: Self-pay | Admitting: Hematology and Oncology

## 2019-03-02 NOTE — Telephone Encounter (Signed)
Returned patient's phone call regarding rescheduling an appointment, could not leave a voicemail and phone line was busy.

## 2019-03-30 ENCOUNTER — Other Ambulatory Visit: Payer: Self-pay | Admitting: Hematology and Oncology

## 2019-03-30 DIAGNOSIS — C50411 Malignant neoplasm of upper-outer quadrant of right female breast: Secondary | ICD-10-CM

## 2019-03-30 DIAGNOSIS — T451X5A Adverse effect of antineoplastic and immunosuppressive drugs, initial encounter: Secondary | ICD-10-CM

## 2019-03-30 DIAGNOSIS — G620 Drug-induced polyneuropathy: Secondary | ICD-10-CM

## 2019-08-03 ENCOUNTER — Other Ambulatory Visit: Payer: Self-pay | Admitting: Hematology and Oncology

## 2019-08-03 DIAGNOSIS — C50411 Malignant neoplasm of upper-outer quadrant of right female breast: Secondary | ICD-10-CM

## 2019-08-03 DIAGNOSIS — G620 Drug-induced polyneuropathy: Secondary | ICD-10-CM

## 2019-09-18 IMAGING — CR DG ELBOW COMPLETE 3+V*R*
4 series · 4 of 4 positions shown · non-contrast
Comparison: None.

CLINICAL DATA: Status post fall on the pain med and parking lot
landing on the right.

EXAM:
RIGHT ELBOW - COMPLETE 3+ VIEW

[x elbow ap right]
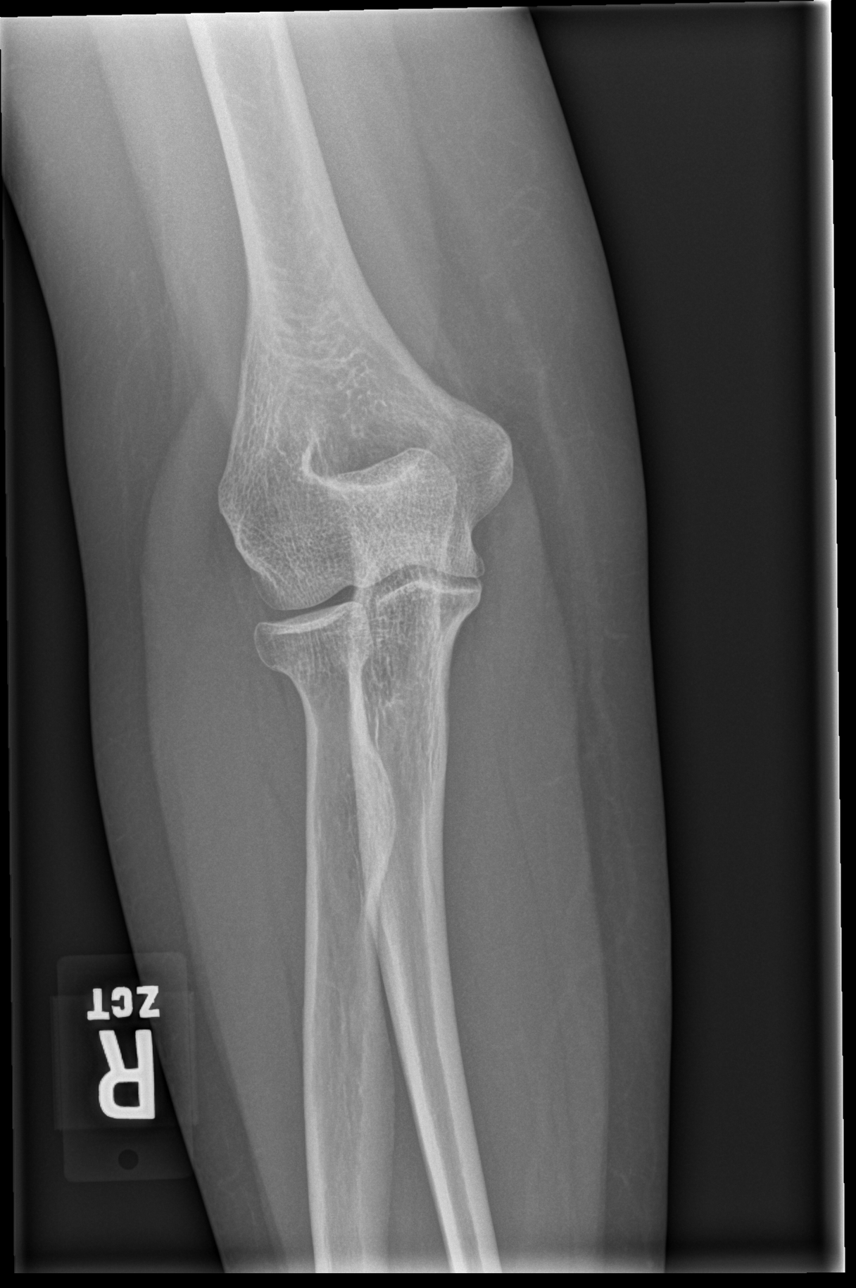

[x elbow obl right (1 of 2)]
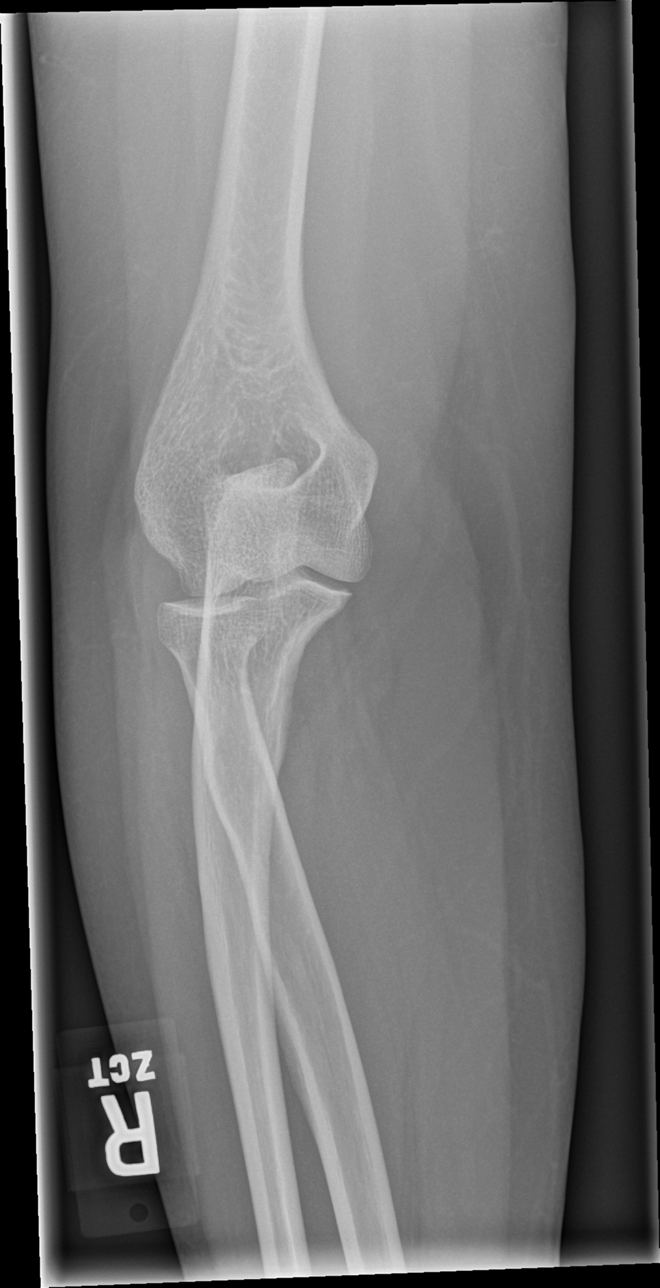

[x elbow obl right (2 of 2)]
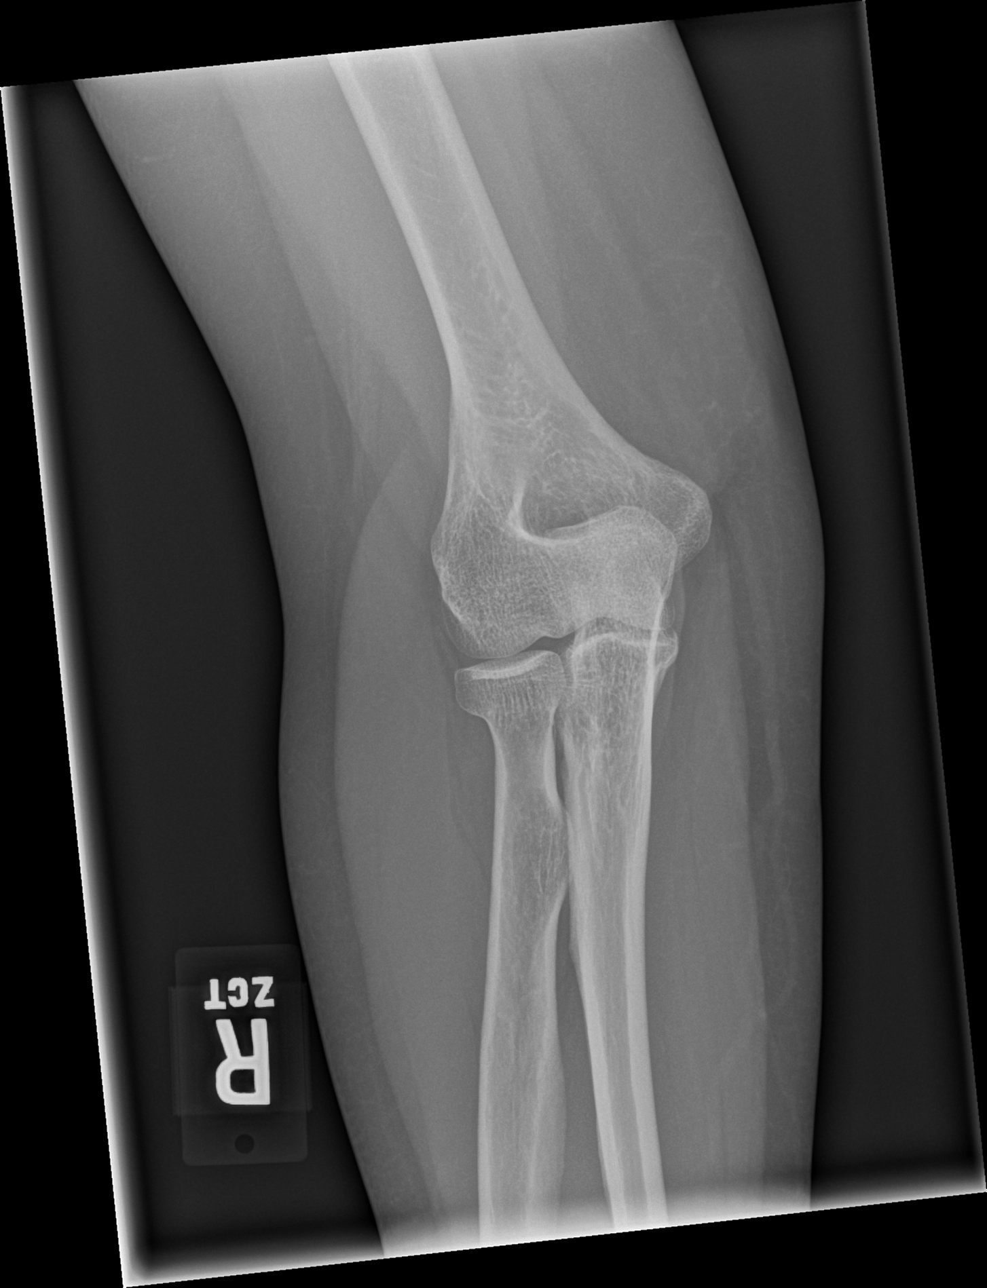

[x elbow lat right]
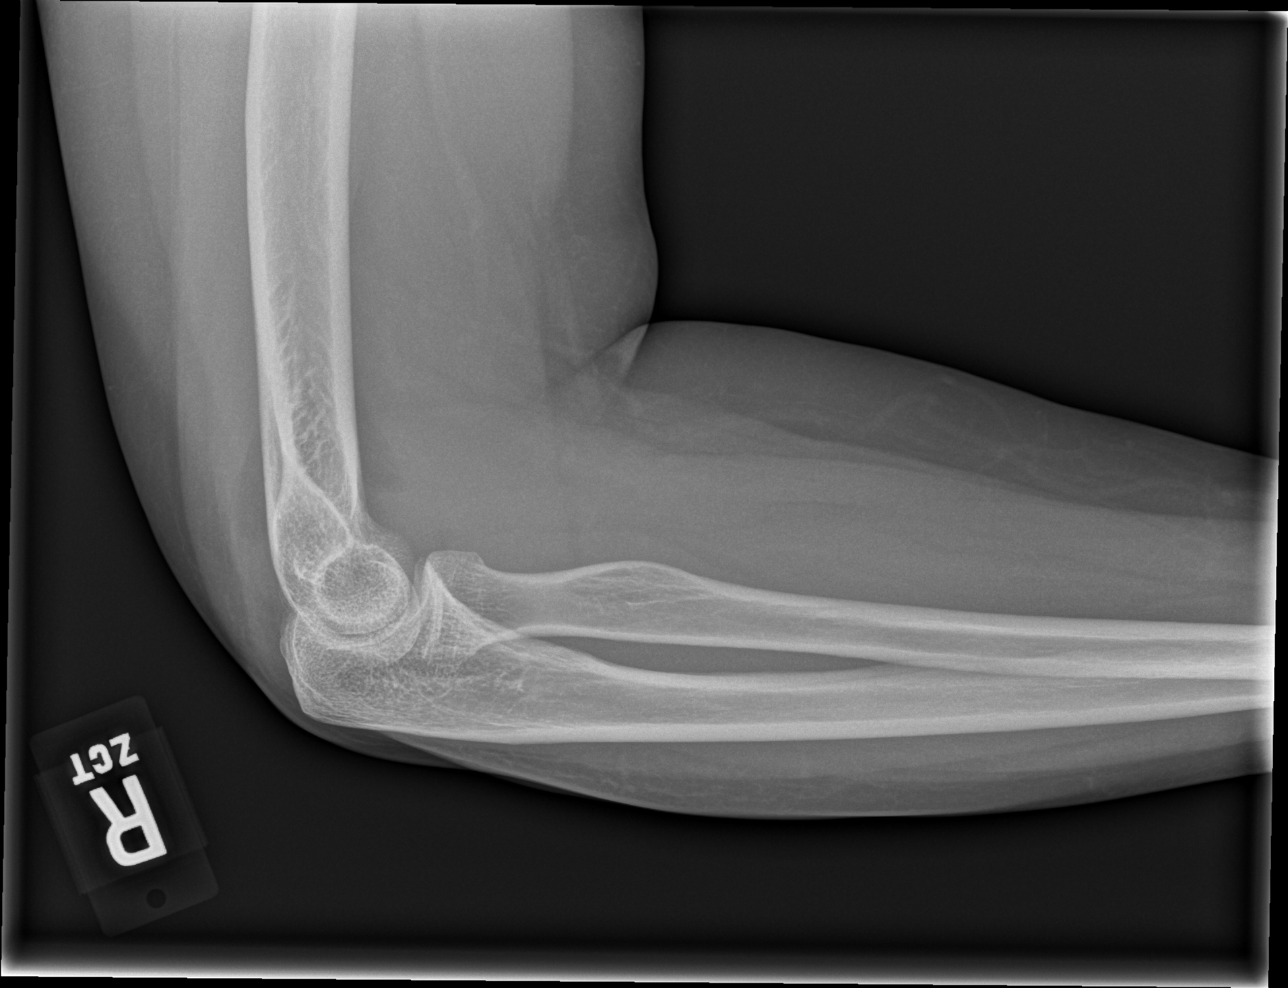

[4 of 4 positions shown; findings below may reference images not displayed]

FINDINGS: The bones of the right elbow are subjectively adequately
mineralized. There is subtle acute angulation of the radial head
along its ventral surface. The olecranon is intact. The distal
humerus appears intact. There is a large joint effusion.
IMPRESSION: Probable minimally impacted radial head fracture. Large joint
effusion.

## 2020-01-03 ENCOUNTER — Other Ambulatory Visit: Payer: Self-pay | Admitting: Hematology and Oncology

## 2020-01-03 DIAGNOSIS — C50411 Malignant neoplasm of upper-outer quadrant of right female breast: Secondary | ICD-10-CM

## 2020-01-03 DIAGNOSIS — T451X5A Adverse effect of antineoplastic and immunosuppressive drugs, initial encounter: Secondary | ICD-10-CM

## 2020-05-31 ENCOUNTER — Other Ambulatory Visit: Payer: Self-pay | Admitting: Hematology and Oncology

## 2020-05-31 DIAGNOSIS — T451X5A Adverse effect of antineoplastic and immunosuppressive drugs, initial encounter: Secondary | ICD-10-CM

## 2020-05-31 DIAGNOSIS — G620 Drug-induced polyneuropathy: Secondary | ICD-10-CM

## 2020-05-31 DIAGNOSIS — C50411 Malignant neoplasm of upper-outer quadrant of right female breast: Secondary | ICD-10-CM

## 2020-09-06 ENCOUNTER — Telehealth: Payer: Self-pay | Admitting: Hematology and Oncology

## 2020-09-06 NOTE — Telephone Encounter (Signed)
Received a new pt referral from Dr. Lysle Rubens for hx of breast cancer. Heidi Sexton has been cld and scheduled to re-establish care w/Heidi Sexton on 8/1 at 345pm.

## 2020-09-12 ENCOUNTER — Encounter: Payer: Self-pay | Admitting: Oncology

## 2020-09-16 NOTE — Progress Notes (Signed)
[facility] CONSULT NOTE  Patient Care Team: Wenda Low, MD as PCP - General (Internal Medicine)  CHIEF COMPLAINTS/PURPOSE OF CONSULTATION:  Evaluation of history for bilateral breast cancer  HISTORY OF PRESENTING ILLNESS:  Heidi Sexton 60 y.o. female is here because of history of bilateral of breast cancer having undergone chemotherapy and bilateral mastectomies. She was last seen at the clinic by Dr. Lindi Adie on 04/29/17. She presents to the clinic today for evaluation and to reestablish care.  She reports that the bilateral breast implants appear to have on and off tenderness especially when she moves around her arms.  She continues to suffer from peripheral neuropathy and takes gabapentin twice a day.  More  I reviewed her records extensively and collaborated the history with the patient.  SUMMARY OF ONCOLOGIC HISTORY: Oncology History  Breast cancer, left breast ([place])  07/17/1994 Initial Diagnosis   Left breast cancer when she was [redacted] weeks pregnant 4 cm poorly differentiated IDC and comedocarcinoma, ER/PR negative (HER-2 was not tested in 1996); Adriamycin 2 cycles, T2 N1b stage IIB    07/26/1994 Surgery   Left mastectomy 12/23 lymph nodes positive, IDC with DCIS    08/19/1994 - 12/20/1994 Chemotherapy   Adriamycin and Cytoxan 1 followed by 3 cycles of Adriamycin and Taxol followed by high-dose chemotherapy with Cytoxan, C PDP an autologous transplant at [facility] (Dr J.Vredenburgh) , and local radiation on left    Breast cancer of upper-outer quadrant of right female breast ([place])  09/24/2013 Initial Diagnosis   Right breast cancer: 9 mm right breast UOQ, EUS biopsy: Invasive mammary cancer, triple negative, Ki-67 87%     Miscellaneous   BRCA1 and 2 variants    11/16/2013 Surgery   Right mastectomy with immediate reconstruction with a right latissimus myocutaneous flap with saline implant: Grade 3 IDC 0.6 cm 0/4 lymph nodes, T1 BN 0 stage IA     12/28/2013 - 05/17/2014 Chemotherapy   Taxotere carboplatin 4 followed by Taxol 7 stopped for neuropathy      MEDICAL HISTORY:  Past Medical History:  Diagnosis Date   Anxiety    h/o panic attacks - relative to migranie headaches -pt denies? anxiety related to Chemotherapy   Breast cancer [facility])    "both breasts" 4'16- Cancer rt.breast '2015-with chemo last Tx. 4'16-Dr. Livesay follows.   Family history of anesthesia complication    "daughter got nauseated also"   H/O seasonal allergies    OTC   Heart murmur    during pregnancy only- then resolved    Migraine    "maybe 2/yr" (11/16/2013)   PONV (postoperative nausea and vomiting)    Wears glasses     SURGICAL HISTORY: Past Surgical History:  Procedure Laterality Date   ABDOMINAL HYSTERECTOMY  2011   bso   BREAST BIOPSY Left 1996   BREAST BIOPSY Right 09/2013   BREAST RECONSTRUCTION Left 2001   BREAST RECONSTRUCTION Right 11/16/2013   Procedure: BREAST RECONSTRUCTION;  Surgeon: Crissie Reese, MD;  Location: [facility];  Service: Plastics;  Laterality: Right;   BREAST SURGERY  1996   left mast-axillary node dissection   COLONOSCOPY WITH PROPOFOL N/A 06/05/2015   Procedure: COLONOSCOPY WITH PROPOFOL;  Surgeon: Garlan Fair, MD;  [facility];  Service: Endoscopy;  Laterality: N/A;   LATISSIMUS FLAP TO BREAST Right 11/16/2013   Procedure: LATISSIMUS FLAP TO BREAST WITH SALINE IMPLANTS;  Surgeon: Crissie Reese, MD;  Location: [facility];  Service: Plastics;  Laterality: Right;/ Tram Flap on Left.  MASTECTOMY Left 1996   MASTECTOMY COMPLETE / SIMPLE W/ SENTINEL NODE BIOPSY Right 11/16/2013   MASTECTOMY W/ SENTINEL NODE BIOPSY Right 11/16/2013   Procedure: RIGHT MASTECTOMY WITH SENTINEL LYMPH NODE BIOPSY;  Surgeon: [name], MD;  Location: [facility];  Service: General;  Laterality: Right;   [place] Right 09/26/2015   Procedure: REMOVAL PORT-A-CATH;  Surgeon: [name], MD;  Location:  [facility];  Service: General;  Laterality: Right;   [place] Right 12/27/2013   Procedure: INSERTION PORT-A-CATH;  Surgeon: [name], MD;  Location: [facility];  Service: General;  Laterality: Right;   RECONSTRUCTION BREAST W/ LATISSIMUS DORSI FLAP Right 11/16/2013   w/saline implants   TUNNELED VENOUS CATHETER PLACEMENT  1996   "[facility]; removed later in 1996"    SOCIAL HISTORY: Social History   Socioeconomic History   Marital status: Married    Spouse name: Not on file   Number of children: Not on file   Years of education: Not on file   Highest education level: Not on file  Occupational History   Not on file  Tobacco Use   Smoking status: Former    Packs/day: 0.10    Years: 15.00    Pack years: 1.50    Types: Cigarettes    Quit date: 11/11/1993    Years since quitting: 26.8   Smokeless tobacco: Never  Substance and Sexual Activity   Alcohol use: Yes    Comment: 11/16/2013 "glass of wine maybe twice/month, if that"   Drug use: No   Sexual activity: Not Currently  Other Topics Concern   Not on file  Social History Narrative   Not on file   Social Determinants of Health   Financial Resource Strain: Not on file  Food Insecurity: Not on file  Transportation Needs: Not on file  Physical Activity: Not on file  Stress: Not on file  Social Connections: Not on file  Intimate Partner Violence: Not on file    FAMILY HISTORY: Family History  Problem Relation Age of Onset   Liver cancer Mother     ALLERGIES:  is allergic to adhesive [tape], aleve [naproxen], advil [ibuprofen], and nsaids.  MEDICATIONS:  Current Outpatient Medications  Medication Sig Dispense Refill   acetaminophen (TYLENOL) 500 MG tablet Take 500-1,000 mg by mouth every 6 (six) hours as needed for mild pain.      Cholecalciferol (VITAMIN D) 2000 UNITS tablet Take 2,000 Units by mouth daily.     gabapentin (NEURONTIN) 300 MG  capsule TAKE 1 CAPSULE(300 MG) BY MOUTH THREE TIMES DAILY 90 capsule 2   loratadine (CLARITIN) 10 MG tablet Take 10 mg by mouth daily as needed for allergies.     oxyCODONE (OXY IR/ROXICODONE) 5 MG immediate release tablet Take 1-2 tablets (5-10 mg total) by mouth every 6 (six) hours as needed for moderate pain, severe pain or breakthrough pain. (Patient not taking: Reported on 10/05/2015) 15 tablet 0   No current facility-administered medications for this visit.    REVIEW OF SYSTEMS:   Constitutional: Denies fevers, chills or abnormal night sweats Eyes: Denies blurriness of vision, double vision or watery eyes Ears, nose, mouth, throat, and face: Denies mucositis or sore throat Respiratory: Denies cough, dyspnea or wheezes Cardiovascular: Denies palpitation, chest discomfort or lower extremity swelling Gastrointestinal:  Denies nausea, heartburn or change in bowel habits Skin: Denies abnormal skin rashes Lymphatics: Denies new lymphadenopathy or easy bruising  Neurological: Moderate peripheral neuropathy Behavioral/Psych: Mood is stable, no new changes  Breast: Bilateral reconstructed breasts All other systems were reviewed with the patient and are negative.  PHYSICAL EXAMINATION: ECOG PERFORMANCE STATUS: 2 - Symptomatic, <50% confined to bed  Vitals:   09/18/20 1526  BP: 124/63  Pulse: 78  Resp: 18  Temp: 97.7 F (36.5 C)  SpO2: 100%   Filed Weights   09/18/20 1526  Weight: 237 lb 6.4 oz (107.7 kg)    GENERAL:alert, no distress and comfortable SKIN: skin color, texture, turgor are normal, no rashes or significant lesions EYES: normal, conjunctiva are pink and non-injected, sclera clear OROPHARYNX:no exudate, no erythema and lips, buccal mucosa, and tongue normal  NECK: supple, thyroid normal size, non-tender, without nodularity LYMPH:  no palpable lymphadenopathy in the cervical, axillary or inguinal LUNGS: clear to auscultation and percussion with normal breathing  effort HEART: regular rate & rhythm and no murmurs and no lower extremity edema ABDOMEN:abdomen soft, non-tender and normal bowel sounds Musculoskeletal:no cyanosis of digits and no clubbing  PSYCH: alert & oriented x 3 with fluent speech NEURO: no focal motor/sensory deficits BREAST: Bilateral reconstructed breast without any palpable nodularity.   No palpable axillary or supraclavicular lymphadenopathy (exam performed in the presence of a chaperone)   LABORATORY DATA:  I have reviewed the data as listed Lab Results  Component Value Date   WBC 5.1 04/29/2017   HGB 11.7 04/29/2017   HCT 36.5 04/29/2017   MCV 87.7 04/29/2017   PLT 275 04/29/2017   Lab Results  Component Value Date   NA 141 04/29/2017   K 3.8 04/29/2017   CL 108 04/29/2017   CO2 23 04/29/2017    RADIOGRAPHIC STUDIES: I have personally reviewed the radiological reports and agreed with the findings in the report.  ASSESSMENT AND PLAN:  Breast cancer of upper-outer quadrant of right female breast Bilateral breast cancers Left breast cancer 1996 status postmastectomy, adjuvant chemotherapy, radiation Right breast cancer 2015 s/p mastectomy, adjuvant chemotherapy  Breast cancer surveillance: 1.  No role of mammograms because she had bilateral mastectomies 2. since she had BRCA mutation, she underwent a hysterectomy and bilateral salpingo-oophorectomy 3.  Chest wall and the bilateral breast exam: No palpable lumps or nodules  Chemo induced peripheral neuropathy: We offered her participation in the neuropathy clinical trial.  She understands if she wants to participate in the trial she will have to discontinue gabapentin.  She is open to that suggestion.  Return to clinic based upon her decision regarding participation in a clinical trial.  If not I will see her in 1 year.    All questions were answered. The patient knows to call the clinic with any problems, questions or concerns.   Rulon Eisenmenger, MD,  MPH 09/18/2020    I, Thana Ates, am acting as scribe for Nicholas Lose, MD.  I have reviewed the above documentation for accuracy and completeness, and I agree with the above.

## 2020-09-18 ENCOUNTER — Encounter: Payer: Self-pay | Admitting: Emergency Medicine

## 2020-09-18 ENCOUNTER — Other Ambulatory Visit: Payer: Self-pay

## 2020-09-18 ENCOUNTER — Inpatient Hospital Stay: Payer: 59 | Attending: Hematology and Oncology | Admitting: Hematology and Oncology

## 2020-09-18 DIAGNOSIS — Z853 Personal history of malignant neoplasm of breast: Secondary | ICD-10-CM | POA: Diagnosis not present

## 2020-09-18 DIAGNOSIS — Z9221 Personal history of antineoplastic chemotherapy: Secondary | ICD-10-CM | POA: Diagnosis not present

## 2020-09-18 DIAGNOSIS — Z90722 Acquired absence of ovaries, bilateral: Secondary | ICD-10-CM | POA: Diagnosis not present

## 2020-09-18 DIAGNOSIS — T451X5A Adverse effect of antineoplastic and immunosuppressive drugs, initial encounter: Secondary | ICD-10-CM | POA: Insufficient documentation

## 2020-09-18 DIAGNOSIS — C50411 Malignant neoplasm of upper-outer quadrant of right female breast: Secondary | ICD-10-CM

## 2020-09-18 DIAGNOSIS — G620 Drug-induced polyneuropathy: Secondary | ICD-10-CM | POA: Insufficient documentation

## 2020-09-18 DIAGNOSIS — Z923 Personal history of irradiation: Secondary | ICD-10-CM | POA: Insufficient documentation

## 2020-09-18 DIAGNOSIS — Z87891 Personal history of nicotine dependence: Secondary | ICD-10-CM | POA: Diagnosis not present

## 2020-09-18 DIAGNOSIS — Z171 Estrogen receptor negative status [ER-]: Secondary | ICD-10-CM

## 2020-09-18 DIAGNOSIS — Z808 Family history of malignant neoplasm of other organs or systems: Secondary | ICD-10-CM | POA: Diagnosis not present

## 2020-09-18 DIAGNOSIS — Z9013 Acquired absence of bilateral breasts and nipples: Secondary | ICD-10-CM | POA: Diagnosis not present

## 2020-09-18 NOTE — Assessment & Plan Note (Signed)
Bilateral breast cancers Left breast cancer 1996 status postmastectomy, adjuvant chemotherapy, radiation Right breast cancer 2015 s/p mastectomy, adjuvant chemotherapy  Breast cancer surveillance: 1.  No role of mammograms because she had bilateral mastectomies 2. since she had BRCA mutation, she underwent a hysterectomy and bilateral salpingo-oophorectomy 3.  Chest wall and the bilateral breast exam: No palpable lumps or nodules  Chemo induced peripheral neuropathy: We offered her participation in the neuropathy clinical trial.  She understands if she wants to participate in the trial she will have to discontinue gabapentin.  She is open to that suggestion.  Return to clinic based upon her decision regarding participation in a clinical trial.  If not I will see her in 1 year.

## 2020-09-18 NOTE — Research (Signed)
ACCRU-[place]-2102 - TREATMENT OF ESTABLISHED CHEMOTHERAPY-INDUCED NEUROPATHY WITH N-PALMITOYLETHANOLAMIDE, A CANNABIMIMETIC NUTRACEUTICAL: A RANDOMIZED DOUBLE-BLIND PHASE II PILOT TRIAL  Patient Heidi Sexton was identified by Dr. Lindi Adie as a potential candidate for the above listed study.  This Clinical Research Coordinator met with Heidi Sexton, [name], on 09/18/20 in a manner and location that ensures patient privacy to discuss participation in the above listed research study.  Patient is Unaccompanied.  A copy of the informed consent document with embedded HIPAA language was provided to the patient.  Patient reads, speaks, and understands [place].    Patient was provided with the business card of this Coordinator and encouraged to contact the research team with any questions.  Approximately 10 minutes were spent with the patient reviewing the informed consent documents.  Patient was provided the option of taking informed consent documents home to review and was encouraged to review at their convenience with their support network, including other care providers. Patient took the consent documents home to review.  Plan to follow up with patient next week concerning interest in this study.  Clabe Seal Clinical Research Coordinator I  09/18/20 4:52 PM

## 2020-09-20 ENCOUNTER — Telehealth: Payer: Self-pay | Admitting: Hematology and Oncology

## 2020-09-20 NOTE — Telephone Encounter (Signed)
Scheduled per 8/1 los. Called pt and left a msg

## 2020-09-28 ENCOUNTER — Telehealth: Payer: Self-pay | Admitting: Emergency Medicine

## 2020-09-28 NOTE — Telephone Encounter (Signed)
ACCRU-[place]-2102 - TREATMENT OF ESTABLISHED CHEMOTHERAPY-INDUCED NEUROPATHY WITH N-PALMITOYLETHANOLAMIDE, A CANNABIMIMETIC NUTRACEUTICAL: A RANDOMIZED DOUBLE-BLIND PHASE II PILOT TRIAL  09/28/20 2:55 PM: Called to follow up concerning interest in this research study.   Patient states she has decided to not participate.  She denied any questions at this time.  She was thanked for her consideration.  Clabe Seal Clinical Research Coordinator I  09/28/20  2:55 PM

## 2021-09-14 ENCOUNTER — Telehealth: Payer: Self-pay | Admitting: Hematology and Oncology

## 2021-09-14 NOTE — Telephone Encounter (Signed)
Per 7/28 phone line

## 2021-09-18 ENCOUNTER — Ambulatory Visit: Payer: 59 | Admitting: Hematology and Oncology

## 2023-10-07 ENCOUNTER — Inpatient Hospital Stay (HOSPITAL_BASED_OUTPATIENT_CLINIC_OR_DEPARTMENT_OTHER)
Admission: EM | Admit: 2023-10-07 | Discharge: 2023-10-12 | DRG: 286 | Disposition: A | Discharge status: Home / Self Care | Attending: Internal Medicine | Admitting: Internal Medicine

## 2023-10-07 ENCOUNTER — Other Ambulatory Visit: Payer: Self-pay

## 2023-10-07 ENCOUNTER — Encounter (HOSPITAL_BASED_OUTPATIENT_CLINIC_OR_DEPARTMENT_OTHER): Payer: Self-pay

## 2023-10-07 DIAGNOSIS — Z87891 Personal history of nicotine dependence: Secondary | ICD-10-CM

## 2023-10-07 DIAGNOSIS — D509 Iron deficiency anemia, unspecified: Secondary | ICD-10-CM | POA: Diagnosis present

## 2023-10-07 DIAGNOSIS — I340 Nonrheumatic mitral (valve) insufficiency: Secondary | ICD-10-CM | POA: Diagnosis present

## 2023-10-07 DIAGNOSIS — I5020 Unspecified systolic (congestive) heart failure: Secondary | ICD-10-CM

## 2023-10-07 DIAGNOSIS — Z9221 Personal history of antineoplastic chemotherapy: Secondary | ICD-10-CM

## 2023-10-07 DIAGNOSIS — Z923 Personal history of irradiation: Secondary | ICD-10-CM

## 2023-10-07 DIAGNOSIS — I5021 Acute systolic (congestive) heart failure: Principal | ICD-10-CM | POA: Diagnosis present

## 2023-10-07 DIAGNOSIS — I428 Other cardiomyopathies: Secondary | ICD-10-CM | POA: Diagnosis present

## 2023-10-07 DIAGNOSIS — R0602 Shortness of breath: Secondary | ICD-10-CM | POA: Diagnosis present

## 2023-10-07 DIAGNOSIS — Z91048 Other nonmedicinal substance allergy status: Secondary | ICD-10-CM

## 2023-10-07 DIAGNOSIS — I2693 Single subsegmental pulmonary embolism without acute cor pulmonale: Secondary | ICD-10-CM | POA: Diagnosis present

## 2023-10-07 DIAGNOSIS — E66812 Obesity, class 2: Secondary | ICD-10-CM | POA: Diagnosis present

## 2023-10-07 DIAGNOSIS — I2510 Atherosclerotic heart disease of native coronary artery without angina pectoris: Secondary | ICD-10-CM | POA: Diagnosis present

## 2023-10-07 DIAGNOSIS — Z9013 Acquired absence of bilateral breasts and nipples: Secondary | ICD-10-CM

## 2023-10-07 DIAGNOSIS — I2699 Other pulmonary embolism without acute cor pulmonale: Principal | ICD-10-CM | POA: Diagnosis present

## 2023-10-07 DIAGNOSIS — R7401 Elevation of levels of liver transaminase levels: Secondary | ICD-10-CM | POA: Diagnosis present

## 2023-10-07 DIAGNOSIS — I509 Heart failure, unspecified: Secondary | ICD-10-CM

## 2023-10-07 DIAGNOSIS — I071 Rheumatic tricuspid insufficiency: Secondary | ICD-10-CM | POA: Diagnosis present

## 2023-10-07 DIAGNOSIS — Z6839 Body mass index (BMI) 39.0-39.9, adult: Secondary | ICD-10-CM

## 2023-10-07 DIAGNOSIS — F419 Anxiety disorder, unspecified: Secondary | ICD-10-CM | POA: Diagnosis present

## 2023-10-07 DIAGNOSIS — Z800 Family history of malignant neoplasm of digestive organs: Secondary | ICD-10-CM

## 2023-10-07 DIAGNOSIS — Z7901 Long term (current) use of anticoagulants: Secondary | ICD-10-CM

## 2023-10-07 DIAGNOSIS — Z79899 Other long term (current) drug therapy: Secondary | ICD-10-CM

## 2023-10-07 DIAGNOSIS — Z886 Allergy status to analgesic agent status: Secondary | ICD-10-CM

## 2023-10-07 DIAGNOSIS — Z853 Personal history of malignant neoplasm of breast: Secondary | ICD-10-CM

## 2023-10-07 NOTE — ED Triage Notes (Signed)
 Pt reports she went to her PCP and was sent here for evaluation. Pt reports its started as sob , cp.Pt reports x2 days of bilateral leg swelling.

## 2023-10-08 ENCOUNTER — Emergency Department (HOSPITAL_BASED_OUTPATIENT_CLINIC_OR_DEPARTMENT_OTHER)

## 2023-10-08 DIAGNOSIS — R0602 Shortness of breath: Secondary | ICD-10-CM | POA: Diagnosis present

## 2023-10-08 DIAGNOSIS — I2699 Other pulmonary embolism without acute cor pulmonale: Secondary | ICD-10-CM | POA: Diagnosis present

## 2023-10-08 DIAGNOSIS — I509 Heart failure, unspecified: Secondary | ICD-10-CM | POA: Diagnosis present

## 2023-10-08 DIAGNOSIS — I959 Hypotension, unspecified: Secondary | ICD-10-CM | POA: Diagnosis not present

## 2023-10-08 DIAGNOSIS — R609 Edema, unspecified: Secondary | ICD-10-CM | POA: Diagnosis not present

## 2023-10-08 DIAGNOSIS — Z743 Need for continuous supervision: Secondary | ICD-10-CM | POA: Diagnosis not present

## 2023-10-08 LAB — HEPARIN LEVEL (UNFRACTIONATED)
Heparin Unfractionated: 0.54 [IU]/mL (ref 0.30–0.70)
Heparin Unfractionated: 0.21 [IU]/mL — ABNORMAL LOW (ref 0.30–0.70)

## 2023-10-08 LAB — CBC WITH DIFFERENTIAL/PLATELET
Abs Immature Granulocytes: 0.03 K/uL (ref 0.00–0.07)
Basophils Absolute: 0 K/uL (ref 0.0–0.1)
Basophils Relative: 0 %
Eosinophils Absolute: 0.1 K/uL (ref 0.0–0.5)
Eosinophils Relative: 1 %
HCT: 32.2 % — ABNORMAL LOW (ref 36.0–46.0)
Hemoglobin: 10.2 g/dL — ABNORMAL LOW (ref 12.0–15.0)
Immature Granulocytes: 0 %
Lymphocytes Relative: 31 %
Lymphs Abs: 2.1 K/uL (ref 0.7–4.0)
MCH: 27.6 pg (ref 26.0–34.0)
MCHC: 31.7 g/dL (ref 30.0–36.0)
MCV: 87 fL (ref 80.0–100.0)
Monocytes Absolute: 0.5 K/uL (ref 0.1–1.0)
Monocytes Relative: 7 %
Neutro Abs: 4.1 K/uL (ref 1.7–7.7)
Neutrophils Relative %: 61 %
Platelets: 269 K/uL (ref 150–400)
RBC: 3.7 MIL/uL — ABNORMAL LOW (ref 3.87–5.11)
RDW: 15.9 % — ABNORMAL HIGH (ref 11.5–15.5)
WBC: 6.8 K/uL (ref 4.0–10.5)
nRBC: 0 % (ref 0.0–0.2)

## 2023-10-08 LAB — BASIC METABOLIC PANEL WITH GFR
Anion gap: 12 (ref 5–15)
BUN: 17 mg/dL (ref 6–20)
CO2: 20 mmol/L — ABNORMAL LOW (ref 22–32)
Calcium: 8.8 mg/dL — ABNORMAL LOW (ref 8.9–10.3)
Chloride: 106 mmol/L (ref 98–111)
Creatinine, Ser: 0.97 mg/dL (ref 0.44–1.00)
GFR, Estimated: >60 mL/min (ref >60)
Glucose, Bld: 115 mg/dL — ABNORMAL HIGH (ref 70–99)
Potassium: 3.9 mmol/L (ref 3.5–5.1)
Sodium: 138 mmol/L (ref 135–145)

## 2023-10-08 LAB — HEPATIC FUNCTION PANEL
ALT: 173 U/L — ABNORMAL HIGH (ref 0–44)
AST: 81 U/L — ABNORMAL HIGH (ref 15–41)
Albumin: 3.3 g/dL — ABNORMAL LOW (ref 3.5–5.0)
Alkaline Phosphatase: 107 U/L (ref 38–126)
Bilirubin, Direct: 0.4 mg/dL — ABNORMAL HIGH (ref 0.0–0.2)
Indirect Bilirubin: 1.7 mg/dL — ABNORMAL HIGH (ref 0.3–0.9)
Total Bilirubin: 2.1 mg/dL — ABNORMAL HIGH (ref 0.0–1.2)
Total Protein: 6.5 g/dL (ref 6.5–8.1)

## 2023-10-08 LAB — TROPONIN T, HIGH SENSITIVITY
Troponin T High Sensitivity: 26 ng/L — ABNORMAL HIGH (ref 0–19)
Troponin T High Sensitivity: 26 ng/L — ABNORMAL HIGH (ref 0–19)
Troponin T High Sensitivity: 29 ng/L — ABNORMAL HIGH (ref 0–19)
Troponin T High Sensitivity: 29 ng/L — ABNORMAL HIGH (ref 0–19)

## 2023-10-08 LAB — PRO BRAIN NATRIURETIC PEPTIDE: Pro Brain Natriuretic Peptide: 4583 pg/mL — ABNORMAL HIGH (ref <300.0)

## 2023-10-08 LAB — MAGNESIUM: Magnesium: 1.8 mg/dL (ref 1.7–2.4)

## 2023-10-08 LAB — HIV ANTIBODY (ROUTINE TESTING W REFLEX): HIV Screen 4th Generation wRfx: NONREACTIVE

## 2023-10-08 LAB — MRSA NEXT GEN BY PCR, NASAL: MRSA by PCR Next Gen: NOT DETECTED

## 2023-10-08 MED ORDER — HEPARIN BOLUS VIA INFUSION
4000.0000 [IU] | Freq: Once | INTRAVENOUS | Status: AC
  Administered 2023-10-08: 4000 [IU] via INTRAVENOUS

## 2023-10-08 MED ORDER — IOHEXOL 350 MG/ML SOLN
75.0000 mL | Freq: Once | INTRAVENOUS | Status: AC | PRN
  Administered 2023-10-08: 75 mL via INTRAVENOUS

## 2023-10-08 MED ORDER — BISACODYL 5 MG PO TBEC: 5.0000 mg | DELAYED_RELEASE_TABLET | Freq: Every day | ORAL | Status: DC | PRN

## 2023-10-08 MED ORDER — FUROSEMIDE 10 MG/ML IJ SOLN
20.0000 mg | Freq: Two times a day (BID) | INTRAMUSCULAR | Status: AC
  Administered 2023-10-08 – 2023-10-09 (×2): 20 mg via INTRAVENOUS
  Filled 2023-10-08 (×2): qty 2

## 2023-10-08 MED ORDER — ONDANSETRON HCL 4 MG/2ML IJ SOLN: 4.0000 mg | Freq: Four times a day (QID) | INTRAMUSCULAR | Status: DC | PRN

## 2023-10-08 MED ORDER — SODIUM CHLORIDE 0.9% FLUSH
3.0000 mL | Freq: Two times a day (BID) | INTRAVENOUS | Status: DC
  Administered 2023-10-08 – 2023-10-12 (×6): 3 mL via INTRAVENOUS

## 2023-10-08 MED ORDER — ACETAMINOPHEN 650 MG RE SUPP
650.0000 mg | Freq: Four times a day (QID) | RECTAL | Status: DC | PRN
Start: 2023-10-08 — End: 2023-10-12

## 2023-10-08 MED ORDER — GABAPENTIN 300 MG PO CAPS
300.0000 mg | ORAL_CAPSULE | Freq: Three times a day (TID) | ORAL | Status: DC
  Administered 2023-10-08 – 2023-10-12 (×11): 300 mg via ORAL
  Filled 2023-10-08 (×11): qty 1

## 2023-10-08 MED ORDER — HEPARIN (PORCINE) 25000 UT/250ML-% IV SOLN
1700.0000 [IU]/h | INTRAVENOUS | Status: DC
  Administered 2023-10-08 – 2023-10-09 (×3): 1400 [IU]/h via INTRAVENOUS
  Administered 2023-10-10: 1700 [IU]/h via INTRAVENOUS
  Filled 2023-10-08 (×4): qty 250

## 2023-10-08 MED ORDER — FUROSEMIDE 10 MG/ML IJ SOLN
40.0000 mg | Freq: Once | INTRAMUSCULAR | Status: AC
  Administered 2023-10-08: 40 mg via INTRAVENOUS
  Filled 2023-10-08: qty 4

## 2023-10-08 MED ORDER — ACETAMINOPHEN 325 MG PO TABS
650.0000 mg | ORAL_TABLET | Freq: Four times a day (QID) | ORAL | Status: DC | PRN
  Administered 2023-10-08: 650 mg via ORAL
  Filled 2023-10-08: qty 2

## 2023-10-08 MED ORDER — FENTANYL CITRATE PF 50 MCG/ML IJ SOSY: 50.0000 ug | PREFILLED_SYRINGE | Freq: Once | INTRAMUSCULAR | Status: DC

## 2023-10-08 MED ORDER — HYDRALAZINE HCL 20 MG/ML IJ SOLN
5.0000 mg | INTRAMUSCULAR | Status: DC | PRN
Start: 2023-10-08 — End: 2023-10-12

## 2023-10-08 MED ORDER — ONDANSETRON HCL 4 MG PO TABS: 4.0000 mg | ORAL_TABLET | Freq: Four times a day (QID) | ORAL | Status: DC | PRN

## 2023-10-08 MED ORDER — POLYETHYLENE GLYCOL 3350 17 G PO PACK: 17.0000 g | PACK | Freq: Every day | ORAL | Status: DC | PRN

## 2023-10-08 NOTE — H&P (Addendum)
 History and Physical    Patient: Heidi Sexton FMW:[id number] DOB: Jul 09, 1960 DOA: 10/07/2023 DOS: the patient was seen and examined on 10/08/2023 . PCP: Ransom Other, MD  Patient coming from: [facility] Chief complaint: Chief Complaint  Patient presents with   Shortness of Breath   HPI:  Heidi Sexton is a 63 y.o. female with bilateral breast CA, s/p mastectomy, chemo, BSO, neuropathy, p/w acute / subacute sob, chest tightness, and LE swelling. Referred to ED by PCP for abnl ddimer, BNP, trop. CTA with small RLL subsegmental PE, also cardiomegaly + mild edema.  Shortness of breath has been progressive over the past few days.  Patient at bedside states that she is actually been short of breath for 4 months in fact she has been talking to her PCP who initially thought it was allergy and asthma and then recently when he got bad she told him and he ordered a chest x-ray.  ED Course:  Vital signs in the ED were notable for the following:  Vitals:   10/08/23 1230 10/08/23 1333 10/08/23 1400 10/08/23 1538  BP:   (!) 120/58 (!) 123/50  Pulse: 93  88 (!) 103  Temp:  98 F (36.7 C)  97.6 F (36.4 C)  Resp: 19  20 16   Height:      Weight:      SpO2: 98%  93% 99%  TempSrc:  Oral  Oral  BMI (Calculated):      >>ED evaluation thus far shows: BMP showing glucose of 150 bicarb of 20 normal anion gap LFTs added on and pending. BNP of 4583 troponin flat at 26. CBC shows anemia with a hemoglobin of 10.2 and platelets 269 and normal white count.  >>While in the ED patient received the following: Anxiety, medications  heparin  ADULT infusion 100 units/mL (25000 units/250mL) (1,400 Units/hr Intravenous New Bag/Given 10/08/23 0218)  fentaNYL  (SUBLIMAZE ) injection 50 mcg (50 mcg Intravenous Not Given 10/08/23 1233)  iohexol  (OMNIPAQUE ) 350 MG/ML injection 75 mL (75 mLs Intravenous Contrast Given 10/08/23 0111)  furosemide  (LASIX ) injection 40 mg (40 mg Intravenous Given 10/08/23 0211)  heparin   bolus via infusion 4,000 Units (4,000 Units Intravenous Bolus from Bag 10/08/23 0218)   Review of Systems  Constitutional:  Positive for fever.  Respiratory:  Positive for shortness of breath.   Neurological:  Positive for weakness.   Past Medical History:  Diagnosis Date   Anxiety    h/o panic attacks - relative to migranie headaches -pt denies? anxiety related to Chemotherapy   Breast cancer [facility])    both breasts 4'16- Cancer rt.breast '2015-with chemo last Tx. 4'16-Dr. Livesay follows.   Family history of anesthesia complication    daughter got nauseated also   H/O seasonal allergies    OTC   Heart murmur    during pregnancy only- then resolved    Migraine    maybe 2/yr (11/16/2013)   PONV (postoperative nausea and vomiting)    Wears glasses    Past Surgical History:  Procedure Laterality Date   ABDOMINAL HYSTERECTOMY  2011   bso   BREAST BIOPSY Left 1996   BREAST BIOPSY Right 09/2013   BREAST RECONSTRUCTION Left 2001   BREAST RECONSTRUCTION Right 11/16/2013   Procedure: BREAST RECONSTRUCTION;  Surgeon: Alm Sick, MD;  Location: [facility] OR;  Service: Plastics;  Laterality: Right;   BREAST SURGERY  1996   left mast-axillary node dissection   COLONOSCOPY WITH PROPOFOL  N/A 06/05/2015   Procedure: COLONOSCOPY WITH PROPOFOL ;  Surgeon: Gladis MARLA Louder,  MD;  Location: [facility]COPY;  Service: Endoscopy;  Laterality: N/A;   LATISSIMUS FLAP TO BREAST Right 11/16/2013   Procedure: LATISSIMUS FLAP TO BREAST WITH SALINE IMPLANTS;  Surgeon: Alm Sick, MD;  Location: [facility] OR;  Service: Plastics;  Laterality: Right;/ Tram Flap on Left.   MASTECTOMY Left 1996   MASTECTOMY COMPLETE / SIMPLE W/ SENTINEL NODE BIOPSY Right 11/16/2013   MASTECTOMY W/ SENTINEL NODE BIOPSY Right 11/16/2013   Procedure: RIGHT MASTECTOMY WITH SENTINEL LYMPH NODE BIOPSY;  Surgeon: Jina Nephew, MD;  Location: [facility] OR;  Service: General;  Laterality: Right;   PORT-A-CATH REMOVAL  1996   PORT-A-CATH REMOVAL Right  09/26/2015   Procedure: REMOVAL PORT-A-CATH;  Surgeon: Jina Nephew, MD;  Location: [facility];  Service: General;  Laterality: Right;   PORTACATH PLACEMENT  1996   PORTACATH PLACEMENT Right 12/27/2013   Procedure: INSERTION PORT-A-CATH;  Surgeon: Jina Nephew, MD;  Location: [facility];  Service: General;  Laterality: Right;   RECONSTRUCTION BREAST W/ LATISSIMUS DORSI FLAP Right 11/16/2013   w/saline implants   TUNNELED VENOUS CATHETER PLACEMENT  1996   [place]; removed later in 1996    reports that she quit smoking about 29 years ago. Her smoking use included cigarettes. She started smoking about 44 years ago. She has a 1.5 pack-year smoking history. She has never used smokeless tobacco. She reports current alcohol use. She reports that she does not use drugs. Allergies  Allergen Reactions   Adhesive [Tape] Hives   Aleve [Naproxen] Itching   Advil [Ibuprofen]     Itchy red face   Nsaids    Family History  Problem Relation Age of Onset   Liver cancer Mother    Prior to Admission medications   Medication Sig Start Date End Date Taking? Authorizing Provider  Cholecalciferol (VITAMIN D) 2000 UNITS tablet Take 2,000 Units by mouth daily.   Yes [provider]  gabapentin  (NEURONTIN ) 300 MG capsule TAKE 1 CAPSULE(300 MG) BY MOUTH THREE TIMES DAILY 05/31/20  Yes Gudena, Vinay, MD  loratadine (CLARITIN) 10 MG tablet Take 10 mg by mouth daily as needed for allergies.   Yes [provider]  oxyCODONE  (OXY IR/ROXICODONE ) 5 MG immediate release tablet Take 1-2 tablets (5-10 mg total) by mouth every 6 (six) hours as needed for moderate pain, severe pain or breakthrough pain. Patient not taking: Reported on 10/05/2015 09/26/15   Nephew Jina, MD                                                                                 Vitals:   10/08/23 1230 10/08/23 1333 10/08/23 1400 10/08/23 1538  BP:   (!) 120/58 (!) 123/50  Pulse: 93  88 (!) 103  Resp: 19   20 16   Temp:  98 F (36.7 C)  97.6 F (36.4 C)  TempSrc:  Oral  Oral  SpO2: 98%  93% 99%  Weight:      Height:       Physical Exam Vitals reviewed.  Constitutional:      General: She is not in acute distress.    Appearance: She is not ill-appearing.  HENT:     Head: Normocephalic and  atraumatic.     Right Ear: External ear normal.     Left Ear: External ear normal.  Eyes:     Extraocular Movements: Extraocular movements intact.  Cardiovascular:     Rate and Rhythm: Normal rate and regular rhythm.     Heart sounds: Normal heart sounds.  Pulmonary:     Breath sounds: Normal breath sounds. No decreased breath sounds.  Abdominal:     General: There is no distension.     Palpations: Abdomen is soft.     Tenderness: There is no abdominal tenderness.  Musculoskeletal:     Right lower leg: Edema present.     Left lower leg: Edema present.  Neurological:     General: No focal deficit present.     Mental Status: She is alert and oriented to person, place, and time.     Labs on Admission: I have personally reviewed following labs and imaging studies CBC: Recent Labs  Lab 10/08/23 0020  WBC 6.8  NEUTROABS 4.1  HGB 10.2*  HCT 32.2*  MCV 87.0  PLT 269   Basic Metabolic Panel: Recent Labs  Lab 10/08/23 0020 10/08/23 1714  NA 138  --   K 3.9  --   CL 106  --   CO2 20*  --   GLUCOSE 115*  --   BUN 17  --   CREATININE 0.97  --   CALCIUM 8.8*  --   MG  --  1.8   GFR: Estimated Creatinine Clearance: 80.1 mL/min (by C-G formula based on SCr of 0.97 mg/dL). Liver Function Tests: Recent Labs  Lab 10/08/23 1714  AST 81*  ALT 173*  ALKPHOS 107  BILITOT 2.1*  PROT 6.5  ALBUMIN 3.3*   No results for input(s): LIPASE, AMYLASE in the last 168 hours. No results for input(s): AMMONIA in the last 168 hours. Recent Labs    10/08/23 0020  BUN 17  CREATININE 0.97    Estimated Creatinine Clearance: 80.1 mL/min (by C-G formula based on SCr of 0.97  mg/dL).   Recent Labs    10/08/23 0020  BUN 17  CREATININE 0.97  CO2 20*   Cardiac Enzymes: No results for input(s): CKTOTAL, CKMB, CKMBINDEX, TROPONINI in the last 168 hours. BNP (last 3 results) Recent Labs    10/08/23 0020  PROBNP 4,583.0*   HbA1C: No results for input(s): HGBA1C in the last 72 hours. CBG: No results for input(s): GLUCAP in the last 168 hours. Lipid Profile: No results for input(s): CHOL, HDL, LDLCALC, TRIG, CHOLHDL, LDLDIRECT in the last 72 hours. Thyroid Function Tests: No results for input(s): TSH, T4TOTAL, FREET4, T3FREE, THYROIDAB in the last 72 hours. Anemia Panel: No results for input(s): VITAMINB12, FOLATE, FERRITIN, TIBC, IRON, RETICCTPCT in the last 72 hours. Urine analysis:    Component Value Date/Time   COLORURINE YELLOW 08/17/2009 1048   APPEARANCEUR CLEAR 08/17/2009 1048   LABSPEC >1.030 (H) 08/17/2009 1048   PHURINE 5.5 08/17/2009 1048   GLUCOSEU NEGATIVE 08/17/2009 1048   HGBUR MODERATE (A) 08/17/2009 1048   BILIRUBINUR NEGATIVE 08/17/2009 1048   KETONESUR NEGATIVE 08/17/2009 1048   PROTEINUR NEGATIVE 08/17/2009 1048   UROBILINOGEN 0.2 08/17/2009 1048   NITRITE NEGATIVE 08/17/2009 1048   LEUKOCYTESUR NEGATIVE 08/17/2009 1048   Radiological Exams on Admission: CT Angio Chest PE W and/or Wo Contrast Result Date: 10/08/2023 CLINICAL DATA:  Shortness of breath and chest pain EXAM: CT ANGIOGRAPHY CHEST WITH CONTRAST TECHNIQUE: Multidetector CT imaging of the chest was performed using the standard  protocol during bolus administration of intravenous contrast. Multiplanar CT image reconstructions and MIPs were obtained to evaluate the vascular anatomy. RADIATION DOSE REDUCTION: This exam was performed according to the departmental dose-optimization program which includes automated exposure control, adjustment of the mA and/or kV according to patient size and/or use of iterative reconstruction  technique. CONTRAST:  75mL OMNIPAQUE  IOHEXOL  350 MG/ML SOLN COMPARISON:  Chest x-ray 10/07/2023 FINDINGS: Cardiovascular: Slightly degraded by motion. Positive for small acute right lower lobe subsegmental embolus, coronal series 9 image 46 and sagittal series 10 image 65. No other discrete filling defects are visualized. Nonaneurysmal aorta. Cardiomegaly. No sizable pericardial effusion Mediastinum/Nodes: Patent trachea. No suspicious thyroid mass. Subcentimeter mediastinal lymph nodes. Esophagus within normal limits. Lungs/Pleura: No sizable pleural effusion. Wedge shaped and somewhat bandlike density in the left upper lobe with associated minimal bronchiectasis, favored to represent focal scarring. Mild smooth septal thickening most evident at the bases, could be due to mild edema. Minimal subpleural scarring in the left upper lobe. Upper Abdomen: Reflux of contrast into the hepatic veins consistent with elevated right heart pressure. No acute finding. Musculoskeletal: Right breast implant. No acute osseous abnormality. Review of the MIP images confirms the above findings. IMPRESSION: 1. Slightly degraded by motion. Findings suspicious for small acute right lower lobe subsegmental embolus. 2. Cardiomegaly with reflux of contrast into the hepatic veins consistent with elevated right heart pressure. Mild smooth septal thickening most evident at the bases, could be due to mild edema. 3. Wedge shaped and somewhat bandlike density in the left upper lobe with associated minimal bronchiectasis, favored to represent focal scarring. Critical Value/emergent results were called by telephone at the time of interpretation on 10/08/2023 at 1:40 am to provider VICENTA ABLE , who verbally acknowledged these results. Electronically Signed   By: Luke Bun M.D.   On: 10/08/2023 01:40   Data Reviewed: Relevant notes from primary care and specialist visits, past discharge summaries as available in EHR, including Care Everywhere  . Prior diagnostic testing as pertinent to current admission diagnoses, Updated medications and problem lists for reconciliation .ED course, including vitals, labs, imaging, treatment and response to treatment,Triage notes, nursing and pharmacy notes and ED provider's notes.Notable results as noted in HPI.Discussed case with EDMD/ ED APP/ or Specialty MD on call and as needed.  Assessment & Plan  >>SOB: 2/2 to PE and new onset CHF.  We will admit pt and cont AC / 2 d echo and cardiology consult has been placed.  Pt has risk factor for PE with her H/O breast ca.    >>Obesity with BMI of 39.16: We will get A1c and follow.   >>Anemia: Mild new and stable we will follow.   >> Lower extremity edema: Will obtain a BL venous Doppler.   >>Abnormal LFT:    Latest Ref Rng & Units 10/08/2023    5:14 PM 04/29/2017    3:13 PM 10/05/2015    9:49 AM  Hepatic Function  Total Protein 6.5 - 8.1 g/dL 6.5  7.4  7.6   Albumin 3.5 - 5.0 g/dL 3.3  3.9  3.8   AST 15 - 41 U/L 81  15  17   ALT 0 - 44 U/L 173  17  19   Alk Phosphatase 38 - 126 U/L 107  77  79   Total Bilirubin 0.0 - 1.2 mg/dL 2.1  0.4  9.23   Bilirubin, Direct 0.0 - 0.2 mg/dL 0.4     We will monitor.RUQ usg.    DVT  prophylaxis:  Heparin  gtt.  Consults:  None   Advance Care Planning:    Code Status: Full Code   Family Communication:  None.  Disposition Plan:  Home.  Severity of Illness: Observation.   Unresulted Labs (From admission, onward)     Start     Ordered   10/09/23 0500  Heparin  level (unfractionated)  Daily,   R      10/08/23 1535   10/09/23 0500  CBC  Daily,   R      10/08/23 1535   10/09/23 0500  Comprehensive metabolic panel  Tomorrow morning,   R        10/08/23 1653   10/08/23 1806  MRSA Next Gen by PCR, Nasal  Once,   R        10/08/23 1806   10/08/23 1800  Heparin  level (unfractionated)  Once-Timed,   TIMED        10/08/23 1535            Meds ordered this encounter  Medications   iohexol   (OMNIPAQUE ) 350 MG/ML injection 75 mL   furosemide  (LASIX ) injection 40 mg   heparin  bolus via infusion 4,000 Units   heparin  ADULT infusion 100 units/mL (25000 units/250mL)   fentaNYL  (SUBLIMAZE ) injection 50 mcg   furosemide  (LASIX ) injection 20 mg   sodium chloride  flush (NS) 0.9 % injection 3 mL   OR Linked Order Group    acetaminophen  (TYLENOL ) tablet 650 mg    acetaminophen  (TYLENOL ) suppository 650 mg   polyethylene glycol (MIRALAX  / GLYCOLAX ) packet 17 g   bisacodyl  (DULCOLAX) EC tablet 5 mg   OR Linked Order Group    ondansetron  (ZOFRAN ) tablet 4 mg    ondansetron  (ZOFRAN ) injection 4 mg   hydrALAZINE  (APRESOLINE ) injection 5 mg     Orders Placed This Encounter  Procedures   MRSA Next Gen by PCR, Nasal   CT Angio Chest PE W and/or Wo Contrast   Basic metabolic panel   CBC with Differential   Pro Brain natriuretic peptide   Heparin  level (unfractionated)   Heparin  level (unfractionated)   Heparin  level (unfractionated)   CBC   Hepatic function panel   Magnesium   HIV Antibody (routine testing w rflx)   Comprehensive metabolic panel   DIET SOFT Room service appropriate? Yes; Fluid consistency: Thin   Notify physician (specify)   Initiate Heart Failure Care Plan   Daily weights   Strict intake and output   In and Out Cath   Patient Education:   Apply Heart Failure Care Plan   [facility] and [facility] only) Obtain REDS clips reading Every morning   Patient has an active order for admit to inpatient/place in observation   Maintain IV access   Vital signs   Notify physician (specify)   Mobility Protocol: No Restrictions   Refer to Sidebar Report Mobility Protocol for Adult Inpatient   Initiate Adult Central Line Maintenance and Catheter Protocol for patients with central line (CVC, PICC, Port, Hemodialysis, Trialysis)   If patient diabetic or glucose greater than 140 notify physician for Sliding Scale Insulin Orders   Initiate CHG Protocol   Do not place and if present  remove PureWick   Initiate Oral Care Protocol   Initiate Carrier Fluid Protocol   RN may order General Admission PRN Orders utilizing General Admission PRN medications (through manage orders) for the following patient needs: allergy symptoms (Claritin), cold sores (Carmex), cough (Robitussin DM), eye irritation (Liquifilm Tears), hemorrhoids (Tucks), indigestion (Maalox), minor  skin irritation (Hydrocortisone Cream), muscle pain Lucienne Siad), nose irritation (saline nasal spray) and sore throat (Chloraseptic spray).   Cardiac Monitoring - Continuous Indefinite   Swab Process:   Considerations:   If MRSA PCR Screen is Positive:   Full code   Consult to hospitalist   heparin  per pharmacy consult   Inpatient consult to Cardiology   Consult to Transition of Care Team   Consult to Heart Failure Navigation Team [facility], [facility], and [facility])   Nutritional services consult   OT eval and treat   PT eval and treat   Pulse oximetry check with vital signs   Oxygen therapy Mode or (Route): Nasal cannula; Liters Per Minute: 2; Keep O2 saturation between: greater than 92 %   Incentive spirometry   EKG 12-Lead   EKG 12-Lead   EKG   EKG   ECHOCARDIOGRAM COMPLETE   Insert peripheral IV   Place in observation (patient's expected length of stay will be less than 2 midnights)   Aspiration precautions   Fall precautions   VAS US  LOWER EXTREMITY VENOUS (DVT)   Author: Mario LULLA Blanch, MD 12 pm- 8 pm. [facility]. 10/08/2023 8:37 PM Please note for any communication after hours contact [facility] Assigned provider on call on Amion.

## 2023-10-08 NOTE — Plan of Care (Signed)
 Plan of Care Note for accepted transfer  Patient: Heidi Sexton    FMW:[id number]  DOA: 10/07/2023     Facility requesting transfer: [facility] ED  Requesting Provider: Geroldine Berg, MD  Reason for transfer: PE  Facility course:   29 F with hx of bilateral breast CA, s/p mastectomy, chemo, BSO, neuropathy, p/w acute / subacute sob, chest tightness, and LE swelling. Referred to ED by PCP for abnl ddimer, BNP, trop. CTA with small RLL subsegmental PE, also cardiomegaly + mild edema. Suspect combination of HF / PE driving symptoms. Started on heparin  gtt + got lasix  40 mg IV. Will need echo, LE venous duplex.   Plan of care: The patient is accepted for admission to Progressive unit, at [facility].  Author: Dorn Dawson, MD  10/08/2023  Check [email or web address] for on-call coverage.  Nursing staff, Please call [facility] Admits & Consults System-Wide number on Amion as soon as patient's arrival, so appropriate admitting provider can evaluate the pt.

## 2023-10-08 NOTE — ED Notes (Signed)
 Heparin  level drawn and sent to lab. Courier present to take sample.

## 2023-10-08 NOTE — Progress Notes (Signed)
 PHARMACY - ANTICOAGULATION CONSULT NOTE  Pharmacy Consult for heparin  Indication: pulmonary embolus  Allergies  Allergen Reactions   Adhesive [Tape] Hives   Aleve [Naproxen] Itching   Advil [Ibuprofen]     Itchy red face   Nsaids     Patient Measurements: Height: 5' 7 (170.2 cm) Weight: 113.4 kg (250 lb) IBW/kg (Calculated) : 61.6 HEPARIN  DW (KG): 87.9  Vital Signs: Temp: 98 F (36.7 C) (08/19 2332) Temp Source: Oral (08/19 2332) BP: 111/79 (08/20 0026) Pulse Rate: 99 (08/20 0026)  Labs: Recent Labs    10/08/23 0020  HGB 10.2*  HCT 32.2*  PLT 269  CREATININE 0.97    Estimated Creatinine Clearance: 80.1 mL/min (by C-G formula based on SCr of 0.97 mg/dL).   Medical History: Past Medical History:  Diagnosis Date   Anxiety    h/o panic attacks - relative to migranie headaches -pt denies? anxiety related to Chemotherapy   Breast cancer [facility])    both breasts 4'16- Cancer rt.breast '2015-with chemo last Tx. 4'16-Dr. Livesay follows.   Family history of anesthesia complication    daughter got nauseated also   H/O seasonal allergies    OTC   Heart murmur    during pregnancy only- then resolved    Migraine    maybe 2/yr (11/16/2013)   PONV (postoperative nausea and vomiting)    Wears glasses    Assessment: 27 yoF presented with SOB and 2 days of bilat leg swelling. Pharmacy consulted to dose heparin  for PE.  -CTa: findings suspicious for small acute RLL segmental embolus  -CBC shows Hgb 10.2, plts 269 -No PTA anticoagulation reported   Goal of Therapy:  Heparin  level 0.3-0.7 units/ml Monitor platelets by anticoagulation protocol: Yes   Plan:  Give 4000 units bolus x 1 Start heparin  infusion at 1400 units/hr Check anti-Xa level in 8 hours and daily while on heparin  Continue to monitor H&H and platelets  Lynwood Poplar, PharmD, BCPS Clinical Pharmacist 10/08/2023 1:57 AM

## 2023-10-08 NOTE — Progress Notes (Signed)
 ANTICOAGULATION CONSULT NOTE  Pharmacy Consult for Heparin  Indication: pulmonary embolus  Allergies  Allergen Reactions   Adhesive [Tape] Hives   Aleve [Naproxen] Itching   Advil [Ibuprofen]     Itchy red face   Nsaids     Patient Measurements: Height: 5' 7 (170.2 cm) Weight: 113.4 kg (250 lb) IBW/kg (Calculated) : 61.6 Heparin  Dosing Weight: 87.9 kg  Vital Signs: Temp: 98 F (36.7 C) (08/20 1333) Temp Source: Oral (08/20 1333) BP: 119/67 (08/20 1200) Pulse Rate: 93 (08/20 1230)  Labs: Recent Labs    10/08/23 0020 10/08/23 1021  HGB 10.2*  --   HCT 32.2*  --   PLT 269  --   HEPARINUNFRC  --  0.54  CREATININE 0.97  --     Estimated Creatinine Clearance: 80.1 mL/min (by C-G formula based on SCr of 0.97 mg/dL).   Medical History: Past Medical History:  Diagnosis Date   Anxiety    h/o panic attacks - relative to migranie headaches -pt denies? anxiety related to Chemotherapy   Breast cancer [facility])    both breasts 4'16- Cancer rt.breast '2015-with chemo last Tx. 4'16-Dr. Livesay follows.   Family history of anesthesia complication    daughter got nauseated also   H/O seasonal allergies    OTC   Heart murmur    during pregnancy only- then resolved    Migraine    maybe 2/yr (11/16/2013)   PONV (postoperative nausea and vomiting)    Wears glasses     Medications:  (Not in a hospital admission)  Scheduled:   fentaNYL  (SUBLIMAZE ) injection  50 mcg Intravenous Once   Infusions:   heparin  1,400 Units/hr (10/08/23 0218)   PRN:   Assessment: 62 yoF presented with SOB and 2 days of bilat leg swelling. Pharmacy consulted to dose heparin  for PE.   -CTa: findings suspicious for small acute RLL segmental embolus   Patient was not on anticoagulation prior to arrival. Started on 1400 units/hr IV heparin  following 4000 unit IV heparin  bolus.   Resulting heparin  level is 0.54 which is therapeutic.  No issues with infusion or bleeding per RN.  Hgb 10.2;  plt 269  Goal of Therapy:  Heparin  level 0.3-0.7 units/ml Monitor platelets by anticoagulation protocol: Yes   Plan:  Continue heparin  infusion at 1400 units/hr Check anti-Xa level at 6pm  Daily heparin  level while on heparin  Continue to monitor H&H and platelets  Dorn Buttner, PharmD, BCPS 10/08/2023 2:07 PM ED Clinical Pharmacist -  [phone]

## 2023-10-08 NOTE — ED Notes (Signed)
 Patient with run of V-Tach on monitor. Patient alert and responsive. Strip printed and taken to doctor.

## 2023-10-08 NOTE — Plan of Care (Signed)
  Patient is requesting for home gabapentin .  I have ordered it.

## 2023-10-08 NOTE — ED Notes (Signed)
 Patient endorses hunger. This RN explained microwave breakfast options and snack options available to patient. Patient declined all options at this time. Call bell within reach.

## 2023-10-08 NOTE — ED Notes (Signed)
 Report called to Henrietta, Charity fundraiser at [facility].

## 2023-10-08 NOTE — ED Notes (Signed)
 Called [facility] to transport the patient to [facility] 5W rm# 16

## 2023-10-08 NOTE — ED Notes (Signed)
 Patient now endorsing midsternal chest pain. 6/10. New EKG obtained and given to doctor. ED doctor made aware of new chest pain.

## 2023-10-08 NOTE — ED Notes (Signed)
[facility] at bedside to transport patient. 

## 2023-10-08 NOTE — ED Provider Notes (Signed)
 [place] EMERGENCY DEPARTMENT AT [facility] Provider Note   CSN: [id number] Arrival date & time: 10/07/23  2324     Patient presents with: Shortness of Breath   Heidi Sexton is a 63 y.o. female.   Patient is a 63 year old female presenting with a several week history of worsening dyspnea.  She describes feeling tight in her chest and short of breath when she lies flat.  2 days ago, she began with bilateral ankle swelling.  She saw her primary doctor today who obtained laboratory studies showing an elevated D-dimer and slightly elevated troponin.  He called her this evening and told her to come to the ER for evaluation.  She denies fevers or chills.  She has no prior cardiac history.       Prior to Admission medications   Medication Sig Start Date End Date Taking? Authorizing Provider  acetaminophen  (TYLENOL ) 500 MG tablet Take 500-1,000 mg by mouth every 6 (six) hours as needed for mild pain.     [provider]  Cholecalciferol (VITAMIN D) 2000 UNITS tablet Take 2,000 Units by mouth daily.    [provider]  gabapentin  (NEURONTIN ) 300 MG capsule TAKE 1 CAPSULE(300 MG) BY MOUTH THREE TIMES DAILY 05/31/20   Gudena, Vinay, MD  loratadine (CLARITIN) 10 MG tablet Take 10 mg by mouth daily as needed for allergies.    [provider]  oxyCODONE  (OXY IR/ROXICODONE ) 5 MG immediate release tablet Take 1-2 tablets (5-10 mg total) by mouth every 6 (six) hours as needed for moderate pain, severe pain or breakthrough pain. Patient not taking: Reported on 10/05/2015 09/26/15   Aron Shoulders, MD    Allergies: Adhesive [tape], Aleve [naproxen], Advil [ibuprofen], and Nsaids    Review of Systems  All other systems reviewed and are negative.   Updated Vital Signs BP 134/83 (BP Location: Right Arm)   Pulse (!) 117   Temp 98 F (36.7 C) (Oral)   Resp 18   SpO2 97%   Physical Exam Vitals and nursing note reviewed.  Constitutional:      General: She  is not in acute distress.    Appearance: She is well-developed. She is not diaphoretic.  HENT:     Head: Normocephalic and atraumatic.  Cardiovascular:     Rate and Rhythm: Normal rate and regular rhythm.     Heart sounds: No murmur heard.    No friction rub. No gallop.  Pulmonary:     Effort: Pulmonary effort is normal. No respiratory distress.     Breath sounds: Normal breath sounds. No wheezing.  Abdominal:     General: Bowel sounds are normal. There is no distension.     Palpations: Abdomen is soft.     Tenderness: There is no abdominal tenderness.  Musculoskeletal:        General: Normal range of motion.     Cervical back: Normal range of motion and neck supple.     Right lower leg: Edema present.     Left lower leg: Edema present.     Comments: There is 2+ nonpitting edema both lower extremities.  Skin:    General: Skin is warm and dry.  Neurological:     General: No focal deficit present.     Mental Status: She is alert and oriented to person, place, and time.     (all labs ordered are listed, but only abnormal results are displayed) Labs Reviewed  BASIC METABOLIC PANEL WITH GFR  CBC WITH DIFFERENTIAL/PLATELET  TROPONIN  T, HIGH SENSITIVITY    EKG: EKG Interpretation Date/Time:  Tuesday October 07 2023 23:36:51 EDT Ventricular Rate:  106 PR Interval:  141 QRS Duration:  84 QT Interval:  314 QTC Calculation: 417 R Axis:   70  Text Interpretation: Sinus tachycardia Consider left ventricular hypertrophy Nonspecific T abnormalities, inferior leads Baseline wander in lead(s) I aVL Confirmed by Geroldine Berg ([id number]) on 10/08/2023 12:23:46 AM  Radiology: No results found.   Procedures   Medications Ordered in the ED - No data to display                                  Medical Decision Making Amount and/or Complexity of Data Reviewed Labs: ordered. Radiology: ordered.  Risk Prescription drug management. Decision regarding hospitalization.   Patient is  a 63 year old female presenting with shortness of breath at the request of her primary doctor.  He has been having some tightness in her chest and difficulty breathing with exertion for the past 3 weeks.  She is now experiencing swelling in her legs.  Laboratory studies by the primary doctor revealed a markedly elevated BNP and also elevated D-dimer.  She was referred here for further workup.  She arrives here with stable vital signs and is afebrile.  There is no hypoxia.  She does have 2+ pitting edema on exam.  Laboratory studies obtained including CBC, metabolic panel, troponin, BNP.  She has a markedly elevated BNP of 4600 along with troponin of 29.  D-dimer in the office today was 3.6.  CTA of the chest obtained showing a small subsegmental PE in the right lower lobe.  She also shows cardiomegaly along with findings consistent with CHF.  Patient has been started on heparin  and has received an IV dose of Lasix .  I have discussed care with Dr. Keturah who agrees to admit.  CRITICAL CARE Performed by: Berg Geroldine Total critical care time: 40 minutes Critical care time was exclusive of separately billable procedures and treating other patients. Critical care was necessary to treat or prevent imminent or life-threatening deterioration. Critical care was time spent personally by me on the following activities: development of treatment plan with patient and/or surrogate as well as nursing, discussions with consultants, evaluation of patient's response to treatment, examination of patient, obtaining history from patient or surrogate, ordering and performing treatments and interventions, ordering and review of laboratory studies, ordering and review of radiographic studies, pulse oximetry and re-evaluation of patient's condition.      Final diagnoses:  None    ED Discharge Orders     None          Geroldine Berg, MD 10/08/23 0300

## 2023-10-09 ENCOUNTER — Other Ambulatory Visit (HOSPITAL_COMMUNITY): Payer: Self-pay

## 2023-10-09 ENCOUNTER — Observation Stay (HOSPITAL_COMMUNITY)

## 2023-10-09 ENCOUNTER — Observation Stay (HOSPITAL_BASED_OUTPATIENT_CLINIC_OR_DEPARTMENT_OTHER)

## 2023-10-09 ENCOUNTER — Encounter: Payer: Self-pay | Admitting: Oncology

## 2023-10-09 ENCOUNTER — Telehealth (HOSPITAL_COMMUNITY): Payer: Self-pay | Admitting: Pharmacy Technician

## 2023-10-09 DIAGNOSIS — I2609 Other pulmonary embolism with acute cor pulmonale: Secondary | ICD-10-CM

## 2023-10-09 DIAGNOSIS — I2699 Other pulmonary embolism without acute cor pulmonale: Secondary | ICD-10-CM | POA: Diagnosis not present

## 2023-10-09 DIAGNOSIS — R609 Edema, unspecified: Secondary | ICD-10-CM

## 2023-10-09 DIAGNOSIS — I5021 Acute systolic (congestive) heart failure: Secondary | ICD-10-CM | POA: Diagnosis not present

## 2023-10-09 LAB — HEPATITIS PANEL, ACUTE
HCV Ab: NONREACTIVE
Hep A IgM: NONREACTIVE
Hep B C IgM: NONREACTIVE
Hepatitis B Surface Ag: NONREACTIVE

## 2023-10-09 LAB — COMPREHENSIVE METABOLIC PANEL WITH GFR
ALT: 148 U/L — ABNORMAL HIGH (ref 0–44)
AST: 58 U/L — ABNORMAL HIGH (ref 15–41)
Albumin: 3.2 g/dL — ABNORMAL LOW (ref 3.5–5.0)
Alkaline Phosphatase: 96 U/L (ref 38–126)
Anion gap: 14 (ref 5–15)
BUN: 10 mg/dL (ref 6–20)
CO2: 25 mmol/L (ref 22–32)
Calcium: 8.6 mg/dL — ABNORMAL LOW (ref 8.9–10.3)
Chloride: 101 mmol/L (ref 98–111)
Creatinine, Ser: 0.93 mg/dL (ref 0.44–1.00)
GFR, Estimated: >60 mL/min (ref >60)
Glucose, Bld: 99 mg/dL (ref 70–99)
Potassium: 3.6 mmol/L (ref 3.5–5.1)
Sodium: 140 mmol/L (ref 135–145)
Total Bilirubin: 2.4 mg/dL — ABNORMAL HIGH (ref 0.0–1.2)
Total Protein: 6.3 g/dL — ABNORMAL LOW (ref 6.5–8.1)

## 2023-10-09 LAB — ECHOCARDIOGRAM COMPLETE
AR max vel: 2.29 cm2
AV Peak grad: 8.8 mmHg
Ao pk vel: 1.48 m/s
Area-P 1/2: 5.88 cm2
Height: 67 in
S' Lateral: 5.3 cm
Weight: 4008 [oz_av]

## 2023-10-09 LAB — CBC
HCT: 34 % — ABNORMAL LOW (ref 36.0–46.0)
Hemoglobin: 10.6 g/dL — ABNORMAL LOW (ref 12.0–15.0)
MCH: 27 pg (ref 26.0–34.0)
MCHC: 31.2 g/dL (ref 30.0–36.0)
MCV: 86.5 fL (ref 80.0–100.0)
Platelets: 260 K/uL (ref 150–400)
RBC: 3.93 MIL/uL (ref 3.87–5.11)
RDW: 15.9 % — ABNORMAL HIGH (ref 11.5–15.5)
WBC: 7.8 K/uL (ref 4.0–10.5)
nRBC: 0 % (ref 0.0–0.2)

## 2023-10-09 LAB — LIPID PANEL
Cholesterol: 112 mg/dL (ref 0–200)
HDL: 41 mg/dL (ref >40)
LDL Cholesterol: 54 mg/dL (ref 0–99)
Total CHOL/HDL Ratio: 2.7 ratio
Triglycerides: 84 mg/dL (ref <150)
VLDL: 17 mg/dL (ref 0–40)

## 2023-10-09 LAB — HEPARIN LEVEL (UNFRACTIONATED): Heparin Unfractionated: 0.42 [IU]/mL (ref 0.30–0.70)

## 2023-10-09 LAB — TSH: TSH: 0.879 u[IU]/mL (ref 0.350–4.500)

## 2023-10-09 LAB — PROTIME-INR
INR: 1.2 (ref 0.8–1.2)
Prothrombin Time: 15.4 s — ABNORMAL HIGH (ref 11.4–15.2)

## 2023-10-09 LAB — LACTIC ACID, PLASMA: Lactic Acid, Venous: 1.8 mmol/L (ref 0.5–1.9)

## 2023-10-09 MED ORDER — ASPIRIN 81 MG PO CHEW
81.0000 mg | CHEWABLE_TABLET | ORAL | Status: AC
  Administered 2023-10-10: 81 mg via ORAL
  Filled 2023-10-09: qty 1

## 2023-10-09 MED ORDER — FREE WATER
250.0000 mL | Freq: Once | Status: AC
  Administered 2023-10-10: 250 mL via ORAL

## 2023-10-09 MED ORDER — FUROSEMIDE 10 MG/ML IJ SOLN
40.0000 mg | Freq: Once | INTRAMUSCULAR | Status: AC
  Administered 2023-10-09: 40 mg via INTRAVENOUS
  Filled 2023-10-09: qty 4

## 2023-10-09 MED ORDER — SPIRONOLACTONE 12.5 MG HALF TABLET
12.5000 mg | ORAL_TABLET | Freq: Every day | ORAL | Status: DC
  Administered 2023-10-09 – 2023-10-12 (×4): 12.5 mg via ORAL
  Filled 2023-10-09 (×4): qty 1

## 2023-10-09 MED ORDER — PERFLUTREN LIPID MICROSPHERE
1.0000 mL | INTRAVENOUS | Status: AC | PRN
  Administered 2023-10-09: 3 mL via INTRAVENOUS

## 2023-10-09 MED ORDER — DIGOXIN 125 MCG PO TABS
0.1250 mg | ORAL_TABLET | Freq: Every day | ORAL | Status: DC
  Administered 2023-10-09 – 2023-10-12 (×4): 0.125 mg via ORAL
  Filled 2023-10-09 (×4): qty 1

## 2023-10-09 MED ORDER — ADULT MULTIVITAMIN W/MINERALS CH
1.0000 | ORAL_TABLET | Freq: Every day | ORAL | Status: DC
  Administered 2023-10-09 – 2023-10-12 (×4): 1 via ORAL
  Filled 2023-10-09 (×4): qty 1

## 2023-10-09 NOTE — Progress Notes (Signed)
 PT Cancellation Note  Patient Details Name: Heidi Sexton MRN: [id number] DOB: Jan 26, 1961   Cancelled Treatment:    Reason Eval/Treat Not Completed: Patient at procedure or test/unavailable (Transport arriving to take pt to vascular. Will follow up later if time allows.)   Rakeb Kibble 10/09/2023, 11:59 AM

## 2023-10-09 NOTE — Evaluation (Signed)
 Occupational Therapy Evaluation and Discharge Patient Details Name: Heidi Sexton MRN: [id number] DOB: 05-26-1960 Today's Date: 10/09/2023   History of Present Illness   Pt is a 63 year old woman who presented on 10/07/23 with several weeks of worsening shortness of breath, chest tightness and LE swelling secondary to PE and new onset CHF. PMH: breast ca, neuropathy, obesity.     Clinical Impressions Pt functioning independently in bed mobility and sit to stand with momentum. Supervised for ambulation due to IV heparin . She needs set up to supervision for ADLs. Recommending ADLs with nursing staff, no further OT needs.      If plan is discharge home, recommend the following:         Functional Status Assessment   Patient has not had a recent decline in their functional status     Equipment Recommendations   None recommended by OT     Recommendations for Other Services         Precautions/Restrictions   Precautions Precautions: None Recall of Precautions/Restrictions: Intact     Mobility Bed Mobility Overal bed mobility: Independent                  Transfers Overall transfer level: Needs assistance   Transfers: Sit to/from Stand Sit to Stand: Supervision           General transfer comment: supervision for lines      Balance Overall balance assessment: Needs assistance   Sitting balance-Leahy Scale: Normal       Standing balance-Leahy Scale: Good                             ADL either performed or assessed with clinical judgement   ADL                                         General ADL Comments: set up to supervision     Vision Baseline Vision/History: 1 Wears glasses Ability to See in Adequate Light: 0 Adequate Patient Visual Report: No change from baseline       Perception         Praxis         Pertinent Vitals/Pain Pain Assessment Pain Assessment: No/denies pain      Extremity/Trunk Assessment Upper Extremity Assessment Upper Extremity Assessment: Overall WFL for tasks assessed   Lower Extremity Assessment Lower Extremity Assessment: Defer to PT evaluation   Cervical / Trunk Assessment Cervical / Trunk Assessment: Normal   Communication Communication Communication: No apparent difficulties   Cognition Arousal: Alert Behavior During Therapy: WFL for tasks assessed/performed Cognition: No apparent impairments                               Following commands: Intact       Cueing  General Comments   Cueing Techniques: Verbal cues      Exercises     Shoulder Instructions      Home Living Family/patient expects to be discharged to:: Private residence Living Arrangements: Spouse/significant other;Children Available Help at Discharge: Family Type of Home: House Home Access: Level entry     Home Layout: One level     Bathroom Shower/Tub: Producer, television/film/video: Standard     Home Equipment: None  Prior Functioning/Environment Prior Level of Function : Independent/Modified Independent                    OT Problem List:     OT Treatment/Interventions:        OT Goals(Current goals can be found in the care plan section)       OT Frequency:       Co-evaluation              AM-PAC OT 6 Clicks Daily Activity     Outcome Measure Help from another person eating meals?: None Help from another person taking care of personal grooming?: None Help from another person toileting, which includes using toliet, bedpan, or urinal?: None Help from another person bathing (including washing, rinsing, drying)?: None Help from another person to put on and taking off regular upper body clothing?: None Help from another person to put on and taking off regular lower body clothing?: None 6 Click Score: 24   End of Session Equipment Utilized During Treatment: Gait belt Nurse  Communication: Mobility status  Activity Tolerance: Patient tolerated treatment well Patient left: in chair;with call bell/phone within reach  OT Visit Diagnosis: Other (comment) (decreased activity tolerance)                Time:  -    Charges:  OT General Charges $OT Visit: 1 Visit OT Evaluation $OT Eval Low Complexity: 1 Low OT Treatments $Self Care/Home Management : 8-22 mins  Mliss HERO, OTR/L Acute Rehabilitation Services Office: [phone]   Kennth Mliss Helling 10/09/2023, 1:40 PM

## 2023-10-09 NOTE — Progress Notes (Addendum)
 Initial Nutrition Assessment  DOCUMENTATION CODES:   Obesity unspecified  INTERVENTION:  Snack daily: cottage cheese and pineapple chunks Liberalize diet from GI soft to maximize intake Add MVI w/ minerals   NUTRITION DIAGNOSIS:  Increased nutrient needs related to acute illness as evidenced by estimated needs.  GOAL:  Patient will meet greater than or equal to 90% of their needs  MONITOR:  PO intake, Weight trends, I & O's, Labs  REASON FOR ASSESSMENT:  Consult Assessment of nutrition requirement/status  ASSESSMENT:   Pt with PMH significant for: bilateral breast CA s/p mastectomy and chemo, neuropathy. Sent to ED by PCP for abnormal ddimer, BNP, and troponin. Found to have small RLL subsegmental PE, cardiomegaly, and mild edema.  8/19 - admitted 8/21 - diet advanced to regular from GI soft; Echo: severe MR, EF: 20-25%  Patient resting in bed with echo ongoing during time of visit. She reports SOB for multiple months and recently taking diuretics that dried her out, and caused excessive thirst. Noted weight trend up in last month, likely 2/2 fluid gains.  She endorses adequate appetite PTA. Eating three meals per day. Relayed disappointment in being unable to order potatoes last night for dinner and cottage cheese and pineapple for breakfast. Unsure as to why she is ordered GI soft diet. MD amicable to advancing to maximize intake.  24 Hour Recall B: hashbrowns, sausage OR pancakes and bacon L: salad OR sushi D: protein, starch, veg Snacks: peanuts, chips or apple chips  She reports no problems chewing or swallowing at baseline and stable bowel habits. No BM yet. Will monitor need for bowel regimen.   RD also consulted for nutrition education regarding new onset CHF.  RD provided Low Sodium Nutrition Therapy handout from the Academy of [employer]. Reviewed patient's dietary recall. Provided examples on ways to decrease sodium intake in diet.  Discouraged intake of processed foods and use of salt shaker. Encouraged fresh fruits and vegetables as well as whole grain sources of carbohydrates to maximize fiber intake.   RD discussed why it is important for patient to adhere to diet recommendations, and emphasized the role of fluids, foods to avoid, and importance of weighing self daily. Teach back method used.  Expect adequate compliance.  Admit/Current Weight: 113.6 kg  Reports UBW around 250lbs. Current collected weight appears stated. Recommend collection of new weight to assess trend. Will expect decrease as edema resolves. She reports no significant weight changes in last 6-12 months. Per chart review, however, has shown 5.5% weight gain in last three weeks. Likely r/t edema, although patient reports SOB for multiple months. Continues on diuretics. UOP of 2.8L yesterday.   Meds: fentanyl , furosemide , gabapentin   Liver enzymes elevated, however trending down. Likely 2/2 hepatic congestion r/t heart failure. Hepatitis panel collected.  Labs:  Na+ 140 (wdl) ALT 173>148 (H) AST 81>58 (H) CBGs 99-115 x24 hours    NUTRITION - FOCUSED PHYSICAL EXAM: Flowsheet Row Most Recent Value  Orbital Region No depletion  Upper Arm Region No depletion  Thoracic and Lumbar Region No depletion  Buccal Region No depletion  Temple Region No depletion  Clavicle and Acromion Bone Region No depletion  Scapular Bone Region No depletion  Dorsal Hand No depletion  Patellar Region Unable to assess  [edema]  Anterior Thigh Region Unable to assess  [edema]  Posterior Calf Region Unable to assess  [edema]  Edema (RD Assessment) Mild  Hair Reviewed  Eyes Reviewed  Mouth Reviewed  Skin Reviewed  Nails Reviewed  Diet Order:   Diet Order             DIET SOFT Room service appropriate? Yes; Fluid consistency: Thin  Diet effective now            EDUCATION NEEDS:   No education needs have been identified at this time  Skin:  Skin  Assessment: Reviewed RN Assessment  Last BM:  PTA  Height:  Ht Readings from Last 1 Encounters:  10/08/23 5' 7 (1.702 m)   Weight:  Wt Readings from Last 1 Encounters:  10/09/23 113.6 kg   Ideal Body Weight:  61.4 kg  BMI:  Body mass index is 39.23 kg/m.  Estimated Nutritional Needs:   Kcal:  1600-1800 kcals  Protein:  65-85g  Fluid:  1.6-1.8L/day  Blair Deaner MS, RD, LDN Registered Dietitian Clinical Nutrition RD Inpatient Contact Info in Amion

## 2023-10-09 NOTE — Progress Notes (Signed)
 Echocardiogram 2D Echocardiogram has been performed.  Thea Norlander 10/09/2023, 11:11 AM

## 2023-10-09 NOTE — Telephone Encounter (Signed)
 Patient Product/process development scientist completed.    The patient is insured through [facility]. Patient has [employer], may use a copay card, and/or apply for patient assistance if available.    Ran test claim for Eliquis  5 mg and the current 30 day co-pay is $35.00.   This test claim was processed through [facility]- copay amounts may vary at other pharmacies due to pharmacy/plan contracts, or as the patient moves through the different stages of their insurance plan.     Reyes Sharps, CPHT Pharmacy Technician III Certified Patient Advocate [facility] Patient Advocate Team Direct Number: [phone]  Fax: [phone]

## 2023-10-09 NOTE — Progress Notes (Signed)
 Heart Failure Navigator Progress Note  Assessed for Heart & Vascular TOC clinic readiness.  Patient does not meet criteria due to Advanced Heart Failure Team consulted..   Navigator will sign off at this time.    Stephane Haddock, BSN, Scientist, clinical (histocompatibility and immunogenetics) Only

## 2023-10-09 NOTE — Progress Notes (Signed)
 PROGRESS NOTE    Heidi Sexton  FMW:[id number] DOB: 04-03-1960 DOA: 10/07/2023 PCP: Ransom Other, MD   Chief Complaint  Patient presents with   Shortness of Breath    Brief Narrative:   Heidi Sexton is a 63 y.o. female with bilateral breast CA, s/p mastectomy, radiation therapy, chemo, BSO, neuropathy. - Patient presents to ED secondary to complaints of chest tightness, lower extremity edema, she was noted to have elevated D-dimers by PCP, so she was sent to ED for further workup, workup was significant for elevated BNP, trop. CTA with small RLL subsegmental PE, also cardiomegaly + mild edema.  She was admitted for further workup.  Assessment & Plan:   Principal Problem:   Pulmonary embolism (HCC) Active Problems:   SOB (shortness of breath)  Pulmonary embolism - Continue with heparin  GTT. - Dopplers with no evidence of DVT - Addition to Eliquis  once stable and no further workup is anticipated  Acute systolic heart failure Valvular heart disease 2D echo significant for severe MR and Viar on echo this admission -evidence of volume overload on admission, cardiomegaly, elevated BNP, no extremity edema, 2D echo this admission significant for low EF 20 to 25%. - CHF team consulted  Transaminitis -No acute finding on right upper quadrant ultrasound, this is most likely related to right heart failure with hepatic congestion -Will obtain hepatitis panel  Obesity class II  -BMI of 39.16:  Normocytic anemia -will obtain anemia panel     DVT prophylaxis: Heparin  GTT Code Status: Full code Family Communication: None at bedside Disposition:    Consultants:  Cardiology/CHF team  Subjective:  She denies any chest pain, reports some dyspnea and pleuritic chest pain  Objective: Vitals:   10/08/23 2056 10/08/23 2300 10/09/23 0400 10/09/23 0500  BP: 117/72  109/70   Pulse:   79   Resp: 18 18 19    Temp: 97.8 F (36.6 C) 97.6 F (36.4 C) 97.8 F (36.6 C)    TempSrc: Oral Oral Oral   SpO2:   96%   Weight:    113.6 kg  Height:        Intake/Output Summary (Last 24 hours) at 10/09/2023 1332 Last data filed at 10/09/2023 0630 Gross per 24 hour  Intake 1345.51 ml  Output 2800 ml  Net -1454.49 ml   Filed Weights   10/08/23 0152 10/09/23 0500  Weight: 113.4 kg 113.6 kg    Examination:  Awake Alert, Oriented X 3, No new F.N deficits, Normal affect Symmetrical Chest wall movement, Good air movement bilaterall RRR,No Gallops,Rubs +ve B.Sounds, Abd Soft, No tenderness, No rebound - guarding or rigidity. No Cyanosis, Clubbing, lower extremity ankle trace edema   Data Reviewed: I have personally reviewed following labs and imaging studies  CBC: Recent Labs  Lab 10/08/23 0020 10/09/23 0437  WBC 6.8 7.8  NEUTROABS 4.1  --   HGB 10.2* 10.6*  HCT 32.2* 34.0*  MCV 87.0 86.5  PLT 269 260    Basic Metabolic Panel: Recent Labs  Lab 10/08/23 0020 10/08/23 1714 10/09/23 0437  NA 138  --  140  K 3.9  --  3.6  CL 106  --  101  CO2 20*  --  25  GLUCOSE 115*  --  99  BUN 17  --  10  CREATININE 0.97  --  0.93  CALCIUM 8.8*  --  8.6*  MG  --  1.8  --     GFR: Estimated Creatinine Clearance: 83.7 mL/min (by C-G formula based  on SCr of 0.93 mg/dL).  Liver Function Tests: Recent Labs  Lab 10/08/23 1714 10/09/23 0437  AST 81* 58*  ALT 173* 148*  ALKPHOS 107 96  BILITOT 2.1* 2.4*  PROT 6.5 6.3*  ALBUMIN 3.3* 3.2*    CBG: No results for input(s): GLUCAP in the last 168 hours.   Recent Results (from the past 240 hours)  MRSA Next Gen by PCR, Nasal     Status: None   Collection Time: 10/08/23  6:06 PM   Specimen: Nasal Mucosa; Nasal Swab  Result Value Ref Range Status   MRSA by PCR Next Gen NOT DETECTED NOT DETECTED Final    Comment: (NOTE) The GeneXpert MRSA Assay (FDA approved for NASAL specimens only), is one component of a comprehensive MRSA colonization surveillance program. It is not intended to diagnose MRSA  infection nor to guide or monitor treatment for MRSA infections. Test performance is not FDA approved in patients less than 42 years old. Performed at [facility], [address] [place], [place] [address]          Radiology Studies: VAS US  LOWER EXTREMITY VENOUS (DVT) Result Date: 10/09/2023  Lower Venous DVT Study Patient Name:  Heidi Sexton [facility]  Date of Exam:   10/09/2023 Medical Rec #: [id number]           Accession #:    [id number] Date of Birth: 10-Aug-1960          Patient Gender: F Patient Age:   30 years Exam Location:  [facility] Procedure:      VAS US  LOWER EXTREMITY VENOUS (DVT) Referring Phys: EKTA PATEL --------------------------------------------------------------------------------  Indications: Edema.  Limitations: Body habitus. Performing Technologist: Elmarie Lindau, RVT  Examination Guidelines: A complete evaluation includes B-mode imaging, spectral Doppler, color Doppler, and power Doppler as needed of all accessible portions of each vessel. Bilateral testing is considered an integral part of a complete examination. Limited examinations for reoccurring indications may be performed as noted. The reflux portion of the exam is performed with the patient in reverse Trendelenburg.  +---------+---------------+---------+-----------+----------+--------------+ RIGHT    CompressibilityPhasicitySpontaneityPropertiesThrombus Aging +---------+---------------+---------+-----------+----------+--------------+ CFV      Full           Yes      Yes                                 +---------+---------------+---------+-----------+----------+--------------+ SFJ      Full                                                        +---------+---------------+---------+-----------+----------+--------------+ FV Prox  Full                                                        +---------+---------------+---------+-----------+----------+--------------+ FV Mid   Full                                                         +---------+---------------+---------+-----------+----------+--------------+  FV DistalFull                                                        +---------+---------------+---------+-----------+----------+--------------+ PFV      Full                                                        +---------+---------------+---------+-----------+----------+--------------+ POP      Full           Yes      Yes                                 +---------+---------------+---------+-----------+----------+--------------+ PTV      Full                                                        +---------+---------------+---------+-----------+----------+--------------+ PERO     Full                                                        +---------+---------------+---------+-----------+----------+--------------+   +---------+---------------+---------+-----------+----------+--------------+ LEFT     CompressibilityPhasicitySpontaneityPropertiesThrombus Aging +---------+---------------+---------+-----------+----------+--------------+ CFV      Full           Yes      Yes                                 +---------+---------------+---------+-----------+----------+--------------+ SFJ      Full                                                        +---------+---------------+---------+-----------+----------+--------------+ FV Prox  Full                                                        +---------+---------------+---------+-----------+----------+--------------+ FV Mid   Full                                                        +---------+---------------+---------+-----------+----------+--------------+ FV DistalFull                                                        +---------+---------------+---------+-----------+----------+--------------+  PFV      Full                                                         +---------+---------------+---------+-----------+----------+--------------+ POP      Full           Yes      Yes                                 +---------+---------------+---------+-----------+----------+--------------+ PTV      Full                                                        +---------+---------------+---------+-----------+----------+--------------+ PERO     Full                                                        +---------+---------------+---------+-----------+----------+--------------+     Summary: RIGHT: - There is no evidence of deep vein thrombosis in the lower extremity.  - No cystic structure found in the popliteal fossa.  LEFT: - There is no evidence of deep vein thrombosis in the lower extremity.  - No cystic structure found in the popliteal fossa.  *See table(s) above for measurements and observations.    Preliminary    US  Abdomen Limited RUQ (LIVER/GB) Result Date: 10/09/2023 CLINICAL DATA:  Transaminitis EXAM: ULTRASOUND ABDOMEN LIMITED RIGHT UPPER QUADRANT COMPARISON:  CT 10/12/2013 FINDINGS: Gallbladder: No gallstones or wall thickening visualized. No sonographic Murphy sign noted by sonographer. Common bile duct: Diameter: 2.6 mm, normal. Liver: No focal lesion identified. Within normal limits in parenchymal echogenicity. Portal vein is patent on color Doppler imaging with normal direction of blood flow towards the liver. Other: None. IMPRESSION: Normal right upper quadrant ultrasound. Electronically Signed   By: Oneil Officer M.D.   On: 10/09/2023 10:00   CT Angio Chest PE W and/or Wo Contrast Result Date: 10/08/2023 CLINICAL DATA:  Shortness of breath and chest pain EXAM: CT ANGIOGRAPHY CHEST WITH CONTRAST TECHNIQUE: Multidetector CT imaging of the chest was performed using the standard protocol during bolus administration of intravenous contrast. Multiplanar CT image reconstructions and MIPs were obtained to evaluate the vascular anatomy. RADIATION  DOSE REDUCTION: This exam was performed according to the departmental dose-optimization program which includes automated exposure control, adjustment of the mA and/or kV according to patient size and/or use of iterative reconstruction technique. CONTRAST:  75mL OMNIPAQUE  IOHEXOL  350 MG/ML SOLN COMPARISON:  Chest x-ray 10/07/2023 FINDINGS: Cardiovascular: Slightly degraded by motion. Positive for small acute right lower lobe subsegmental embolus, coronal series 9 image 46 and sagittal series 10 image 65. No other discrete filling defects are visualized. Nonaneurysmal aorta. Cardiomegaly. No sizable pericardial effusion Mediastinum/Nodes: Patent trachea. No suspicious thyroid mass. Subcentimeter mediastinal lymph nodes. Esophagus within normal limits. Lungs/Pleura: No sizable pleural effusion. Wedge shaped and somewhat bandlike density in the left upper lobe with associated minimal bronchiectasis, favored to represent focal scarring. Mild smooth  septal thickening most evident at the bases, could be due to mild edema. Minimal subpleural scarring in the left upper lobe. Upper Abdomen: Reflux of contrast into the hepatic veins consistent with elevated right heart pressure. No acute finding. Musculoskeletal: Right breast implant. No acute osseous abnormality. Review of the MIP images confirms the above findings. IMPRESSION: 1. Slightly degraded by motion. Findings suspicious for small acute right lower lobe subsegmental embolus. 2. Cardiomegaly with reflux of contrast into the hepatic veins consistent with elevated right heart pressure. Mild smooth septal thickening most evident at the bases, could be due to mild edema. 3. Wedge shaped and somewhat bandlike density in the left upper lobe with associated minimal bronchiectasis, favored to represent focal scarring. Critical Value/emergent results were called by telephone at the time of interpretation on 10/08/2023 at 1:40 am to provider VICENTA ABLE , who verbally  acknowledged these results. Electronically Signed   By: Luke Bun M.D.   On: 10/08/2023 01:40        Scheduled Meds:  fentaNYL  (SUBLIMAZE ) injection  50 mcg Intravenous Once   gabapentin   300 mg Oral TID   sodium chloride  flush  3 mL Intravenous Q12H   Continuous Infusions:  heparin  1,400 Units/hr (10/09/23 1154)     LOS: 1 day      Brayton Lye, MD [facility]   To contact the attending provider between 7A-7P or the covering provider during after hours 7P-7A, please log into the web site [email or web address] and access using universal [place] password for that web site. If you do not have the password, please call the hospital operator.  10/09/2023, 1:32 PM

## 2023-10-09 NOTE — Progress Notes (Signed)
 ANTICOAGULATION CONSULT NOTE  Pharmacy Consult for Heparin  Indication: pulmonary embolus  Allergies  Allergen Reactions   Adhesive [Tape] Hives   Aleve [Naproxen] Itching   Advil [Ibuprofen]     Itchy red face   Nsaids     Patient Measurements: Height: 5' 7 (170.2 cm) Weight: 113.6 kg (250 lb 8 oz) IBW/kg (Calculated) : 61.6 Heparin  Dosing Weight: 87.9 kg  Vital Signs: Temp: 97.8 F (36.6 C) (08/21 0400) Temp Source: Oral (08/21 0400) BP: 109/70 (08/21 0400) Pulse Rate: 79 (08/21 0400)  Labs: Recent Labs    10/08/23 0020 10/08/23 1021 10/08/23 1915 10/09/23 0437 10/09/23 0801  HGB 10.2*  --   --  10.6*  --   HCT 32.2*  --   --  34.0*  --   PLT 269  --   --  260  --   LABPROT  --   --   --   --  15.4*  INR  --   --   --   --  1.2  HEPARINUNFRC  --  0.54 0.21* 0.42  --   CREATININE 0.97  --   --  0.93  --     Estimated Creatinine Clearance: 83.7 mL/min (by C-G formula based on SCr of 0.93 mg/dL).   Medical History: Past Medical History:  Diagnosis Date   Anxiety    h/o panic attacks - relative to migranie headaches -pt denies? anxiety related to Chemotherapy   Breast cancer [facility])    both breasts 4'16- Cancer rt.breast '2015-with chemo last Tx. 4'16-Dr. Livesay follows.   Family history of anesthesia complication    daughter got nauseated also   H/O seasonal allergies    OTC   Heart murmur    during pregnancy only- then resolved    Migraine    maybe 2/yr (11/16/2013)   PONV (postoperative nausea and vomiting)    Wears glasses     Medications:  Medications Prior to Admission  Medication Sig Dispense Refill Last Dose/Taking   Cholecalciferol (VITAMIN D) 2000 UNITS tablet Take 2,000 Units by mouth daily.   10/07/2023 Morning   gabapentin  (NEURONTIN ) 300 MG capsule TAKE 1 CAPSULE(300 MG) BY MOUTH THREE TIMES DAILY 90 capsule 2 10/07/2023 Morning   loratadine (CLARITIN) 10 MG tablet Take 10 mg by mouth daily as needed for allergies.   10/07/2023  Morning   oxyCODONE  (OXY IR/ROXICODONE ) 5 MG immediate release tablet Take 1-2 tablets (5-10 mg total) by mouth every 6 (six) hours as needed for moderate pain, severe pain or breakthrough pain. (Patient not taking: Reported on 10/05/2015) 15 tablet 0 Not Taking   Scheduled:   fentaNYL  (SUBLIMAZE ) injection  50 mcg Intravenous Once   furosemide   20 mg Intravenous Q12H   gabapentin   300 mg Oral TID   sodium chloride  flush  3 mL Intravenous Q12H   Infusions:   heparin  1,400 Units/hr (10/08/23 1735)   PRN: acetaminophen  **OR** acetaminophen , bisacodyl , hydrALAZINE , ondansetron  **OR** ondansetron  (ZOFRAN ) IV, polyethylene glycol  Assessment: 62 yoF presented with SOB and 2 days of bilat leg swelling. Pharmacy consulted to dose heparin  for PE.   -CTa: findings suspicious for small acute RLL segmental embolus   Patient was not on anticoagulation prior to arrival. Started on 1400 units/hr IV heparin  following 4000 unit IV heparin  bolus.    Heparin  level therapeutic this AM   Goal of Therapy:  Heparin  level 0.3-0.7 units/ml Monitor platelets by anticoagulation protocol: Yes   Plan:  Continue heparin  infusion at 1400 units/hr Daily heparin   level while on heparin  Continue to monitor H&H and platelets  Thank you. Olam Monte, PharmD  10/09/2023 8:48 AM

## 2023-10-09 NOTE — Evaluation (Signed)
 Physical Therapy Brief Evaluation and Discharge Note Patient Details Name: Heidi Sexton MRN: [id number] DOB: Jan 27, 1961 Today's Date: 10/09/2023   History of Present Illness  Pt is a 63 year old woman who presented on 10/07/23 with several weeks of worsening shortness of breath, chest tightness and LE swelling secondary to PE and new onset CHF. PMH: breast ca, neuropathy, obesity.  Clinical Impression  Pt presents with admitting diagnosis above. Pt today was able to ambulate in hallway independently. PTA pt was fully independent. Pt presents at or near baseline mobility. Pt has no further acute PT needs and will be signing off. Re consult PT if mobility status changes. Pt would benefit from continued mobility with mobility specialist during acute stay.        PT Assessment Patient does not need any further PT services  Assistance Needed at Discharge  PRN    Equipment Recommendations None recommended by PT  Recommendations for Other Services       Precautions/Restrictions Precautions Precautions: None Recall of Precautions/Restrictions: Intact Restrictions Weight Bearing Restrictions Per Provider Order: No        Mobility  Bed Mobility       General bed mobility comments: Up in recliner  Transfers Overall transfer level: Independent Equipment used: None Transfers: Sit to/from Stand Sit to Stand: Independent                Ambulation/Gait Ambulation/Gait assistance: Modified independent (Device/Increase time) Gait Distance (Feet): 300 Feet Assistive device: IV Pole Gait Pattern/deviations: WFL(Within Functional Limits)   General Gait Details: no LOB noted  Home Activity Instructions    Stairs            Modified Rankin (Stroke Patients Only)        Balance     Sitting balance-Leahy Scale: Normal       Standing balance-Leahy Scale: Good            Pertinent Vitals/Pain PT - Brief Vital Signs All Vital Signs Stable: Yes Pain  Assessment Pain Assessment: No/denies pain     Home Living Family/patient expects to be discharged to:: Private residence Living Arrangements: Spouse/significant other;Children Available Help at Discharge: Family;Available PRN/intermittently Home Environment: Level entry   Home Equipment: None        Prior Function Level of Independence: Independent      UE/LE Assessment   UE ROM/Strength/Tone/Coordination: WFL    LE ROM/Strength/Tone/Coordination: [facility]      Communication   Communication Communication: No apparent difficulties     Cognition Overall Cognitive Status: Appears within functional limits for tasks assessed/performed       General Comments General comments (skin integrity, edema, etc.): VSS    Exercises     Assessment/Plan    PT Problem List         PT Visit Diagnosis Other abnormalities of gait and mobility (R26.89)    No Skilled PT Patient at baseline level of functioning;Patient is independent with all acitivity/mobility   Co-evaluation                AMPAC 6 Clicks Help needed turning from your back to your side while in a flat bed without using bedrails?: None Help needed moving from lying on your back to sitting on the side of a flat bed without using bedrails?: None Help needed moving to and from a bed to a chair (including a wheelchair)?: None Help needed standing up from a chair using your arms (e.g., wheelchair or bedside chair)?: None Help needed  to walk in hospital room?: None Help needed climbing 3-5 steps with a railing? : None 6 Click Score: 24      End of Session Equipment Utilized During Treatment: Gait belt Activity Tolerance: Patient tolerated treatment well Patient left: in chair;with call bell/phone within reach;with family/visitor present Nurse Communication: Mobility status PT Visit Diagnosis: Other abnormalities of gait and mobility (R26.89)     Time: 1348-1400 PT Time Calculation (min) (ACUTE ONLY): 12  min  Charges:   PT Evaluation $PT Eval Low Complexity: 1 Low      Danamarie Minami B, PT, DPT Acute Rehab Services [phone]   Heidi Sexton  10/09/2023, 3:59 PM

## 2023-10-09 NOTE — Care Management Obs Status (Signed)
 MEDICARE OBSERVATION STATUS NOTIFICATION   Patient Details  Name: Heidi Sexton MRN: [id number] Date of Birth: [birth year]   Medicare Observation Status Notification Given:  Yes  Obs letter signed and copy given   Claretta Deed 10/09/2023, 1:04 PM

## 2023-10-09 NOTE — Discharge Instructions (Signed)

## 2023-10-09 NOTE — H&P (View-Only) (Signed)
 Advanced Heart Failure Team Consult Note   Primary Physician: Ransom Other, MD Cardiologist:  None  Reason for Consultation: acute systolic heart failure with RV dysfunction  HPI:    Heidi Sexton is seen today for evaluation of acute systolic heart failure with RV dysfunction at the request of Dr. Brayton.   Heidi Sexton is a 63 y.o. female with hx of bilateral breast cancer s/p mastectomy and chemo (adriamycin and cytoxan 96', taxotere  carboplatin  and taxol  15'-16'), s/p bilateral salpingo-oophorectomy.  Heidi Sexton Greek initially had SOB that began 4 months ago.  She reports doing water  aerobics with her husband 4 times a week and she would have to take frequent breaks or sit at the edge of the pool to catch her breath.  She has been having to sleep in a recliner so that she would not bother her husband because she kept getting up as she was unable to lay flat.  She would get significantly SOB with walking outside of her house to her car.  She presented to her PCP 2 months ago with shortness of breath and chest tightness.  At that time her symptoms were attributed to allergies, so she was just treated for that.  Last week she went back to her PCP for further workup and mentioned BLE edema, which was new for her, so they ran some blood work and checked a CXR.  D-dimer showed to be elevated so she was sent to [facility] for further eval.  Since then her workup has revealed PE.  Admission labs reviewed K3.9, SCR 0.97, proBNP 4.5K, HsTrop 29>29>26>26, AST and ALT mildly elevated, HIV negative.  RUQ US  with no acute findings. Echo showed EF 20-25%, LV with GHK, RV severely reduced, LA/RA mod dilated, severe MR/TR. HF team to see for further workup.  Daughter and husband at bedside.  Reports no family history of heart disease.  Quit smoking 30 years ago.  Used to smoke marijuana up until 4 months ago to help with her eating.  Does not cook with too much salt at home.  Drinks~96  oz of fluid at home daily.   Home Medications Prior to Admission medications   Medication Sig Start Date End Date Taking? Authorizing Provider  Cholecalciferol (VITAMIN D) 2000 UNITS tablet Take 2,000 Units by mouth daily.   Yes [provider]  gabapentin  (NEURONTIN ) 300 MG capsule TAKE 1 CAPSULE(300 MG) BY MOUTH THREE TIMES DAILY 05/31/20  Yes Gudena, Vinay, MD  loratadine (CLARITIN) 10 MG tablet Take 10 mg by mouth daily as needed for allergies.   Yes [provider]  oxyCODONE  (OXY IR/ROXICODONE ) 5 MG immediate release tablet Take 1-2 tablets (5-10 mg total) by mouth every 6 (six) hours as needed for moderate pain, severe pain or breakthrough pain. Patient not taking: Reported on 10/05/2015 09/26/15   Aron Shoulders, MD    Past Medical History: Past Medical History:  Diagnosis Date   Anxiety    h/o panic attacks - relative to migranie headaches -pt denies? anxiety related to Chemotherapy   Breast cancer [facility])    both breasts 4'16- Cancer rt.breast '2015-with chemo last Tx. 4'16-Dr. Livesay follows.   Family history of anesthesia complication    daughter got nauseated also   H/O seasonal allergies    OTC   Heart murmur    during pregnancy only- then resolved    Migraine    maybe 2/yr (11/16/2013)   PONV (postoperative nausea and vomiting)    Wears glasses  Past Surgical History: Past Surgical History:  Procedure Laterality Date   ABDOMINAL HYSTERECTOMY  2011   bso   BREAST BIOPSY Left 1996   BREAST BIOPSY Right 09/2013   BREAST RECONSTRUCTION Left 2001   BREAST RECONSTRUCTION Right 11/16/2013   Procedure: BREAST RECONSTRUCTION;  Surgeon: Alm Sick, MD;  Location: [facility] OR;  Service: Plastics;  Laterality: Right;   BREAST SURGERY  1996   left mast-axillary node dissection   COLONOSCOPY WITH PROPOFOL  N/A 06/05/2015   Procedure: COLONOSCOPY WITH PROPOFOL ;  Surgeon: Gladis MARLA Louder, MD;  Location: [facility];  Service: Endoscopy;  Laterality: N/A;    LATISSIMUS FLAP TO BREAST Right 11/16/2013   Procedure: LATISSIMUS FLAP TO BREAST WITH SALINE IMPLANTS;  Surgeon: Alm Sick, MD;  Location: [facility] OR;  Service: Plastics;  Laterality: Right;/ Tram Flap on Left.   MASTECTOMY Left 1996   MASTECTOMY COMPLETE / SIMPLE W/ SENTINEL NODE BIOPSY Right 11/16/2013   MASTECTOMY W/ SENTINEL NODE BIOPSY Right 11/16/2013   Procedure: RIGHT MASTECTOMY WITH SENTINEL LYMPH NODE BIOPSY;  Surgeon: Jina Nephew, MD;  Location: [facility] OR;  Service: General;  Laterality: Right;   PORT-A-CATH REMOVAL  1996   PORT-A-CATH REMOVAL Right 09/26/2015   Procedure: REMOVAL PORT-A-CATH;  Surgeon: Jina Nephew, MD;  Location: [facility];  Service: General;  Laterality: Right;   PORTACATH PLACEMENT  1996   PORTACATH PLACEMENT Right 12/27/2013   Procedure: INSERTION PORT-A-CATH;  Surgeon: Jina Nephew, MD;  Location: [facility];  Service: General;  Laterality: Right;   RECONSTRUCTION BREAST W/ LATISSIMUS DORSI FLAP Right 11/16/2013   w/saline implants   TUNNELED VENOUS CATHETER PLACEMENT  1996   [place]; removed later in 1996    Family History: Family History  Problem Relation Age of Onset   Liver cancer Mother     Social History: Social History   Socioeconomic History   Marital status: Married    Spouse name: Not on file   Number of children: Not on file   Years of education: Not on file   Highest education level: Not on file  Occupational History   Not on file  Tobacco Use   Smoking status: Former    Current packs/day: 0.00    Average packs/day: 0.1 packs/day for 15.0 years (1.5 ttl pk-yrs)    Types: Cigarettes    Start date: 11/12/1978    Quit date: 11/11/1993    Years since quitting: 29.9   Smokeless tobacco: Never  Substance and Sexual Activity   Alcohol use: Yes    Comment: 11/16/2013 glass of wine maybe twice/month, if that   Drug use: No   Sexual activity: Not Currently  Other Topics Concern   Not on file  Social History  Narrative   Not on file   Social Drivers of Health   Financial Resource Strain: Not on file  Food Insecurity: No Food Insecurity (10/08/2023)   Hunger Vital Sign    Worried About Running Out of Food in the Last Year: Never true    Ran Out of Food in the Last Year: Never true  Transportation Needs: No Transportation Needs (10/08/2023)   PRAPARE - Administrator, Civil Service (Medical): No    Lack of Transportation (Non-Medical): No  Physical Activity: Not on file  Stress: Not on file  Social Connections: Not on file    Allergies:  Allergies  Allergen Reactions   Adhesive [Tape] Hives   Aleve [Naproxen] Itching   Advil [Ibuprofen]     Itchy  red face   Nsaids     Objective:    Vital Signs:   Temp:  [97.6 F (36.4 C)-97.8 F (36.6 C)] 97.8 F (36.6 C) (08/21 0400) Pulse Rate:  [79-105] 79 (08/21 0400) Resp:  [16-20] 19 (08/21 0400) BP: (109-124)/(50-72) 109/70 (08/21 0400) SpO2:  [93 %-99 %] 96 % (08/21 0400) Weight:  [113.6 kg] 113.6 kg (08/21 0500)    Weight change: Filed Weights   10/08/23 0152 10/09/23 0500  Weight: 113.4 kg 113.6 kg    Intake/Output:   Intake/Output Summary (Last 24 hours) at 10/09/2023 1347 Last data filed at 10/09/2023 0630 Gross per 24 hour  Intake 1345.51 ml  Output 2800 ml  Net -1454.49 ml    Physical Exam    General: Well appearing.  No respiratory difficulty Neck: JVD difficult to see, does not appear elevated.  Cor: Regular rate & rhythm.  2/6 MR murmur. Lungs: clear Extremities: +1 BL ankle edema  Neuro: alert & oriented x 3. Affect pleasant.   Telemetry   NSR 90s, 0-1 PVCs personally reviewed  EKG    NSR, consider hypertrophy. T wave abnormality  Labs   Basic Metabolic Panel: Recent Labs  Lab 10/08/23 0020 10/08/23 1714 10/09/23 0437  NA 138  --  140  K 3.9  --  3.6  CL 106  --  101  CO2 20*  --  25  GLUCOSE 115*  --  99  BUN 17  --  10  CREATININE 0.97  --  0.93  CALCIUM 8.8*  --  8.6*  MG   --  1.8  --     Liver Function Tests: Recent Labs  Lab 10/08/23 1714 10/09/23 0437  AST 81* 58*  ALT 173* 148*  ALKPHOS 107 96  BILITOT 2.1* 2.4*  PROT 6.5 6.3*  ALBUMIN 3.3* 3.2*   No results for input(s): LIPASE, AMYLASE in the last 168 hours. No results for input(s): AMMONIA in the last 168 hours.  CBC: Recent Labs  Lab 10/08/23 0020 10/09/23 0437  WBC 6.8 7.8  NEUTROABS 4.1  --   HGB 10.2* 10.6*  HCT 32.2* 34.0*  MCV 87.0 86.5  PLT 269 260    Cardiac Enzymes: No results for input(s): CKTOTAL, CKMB, CKMBINDEX, TROPONINI in the last 168 hours.  BNP: BNP (last 3 results) No results for input(s): BNP in the last 8760 hours.  ProBNP (last 3 results) Recent Labs    10/08/23 0020  PROBNP 4,583.0*     CBG: No results for input(s): GLUCAP in the last 168 hours.  Coagulation Studies: Recent Labs    10/09/23 0801  LABPROT 15.4*  INR 1.2     Imaging   ECHOCARDIOGRAM COMPLETE Result Date: 10/09/2023    ECHOCARDIOGRAM REPORT   Patient Name:   Heidi Sexton Date of Exam: 10/09/2023 Medical Rec #:  [id number]          Height:       67.0 in Accession #:    [id number]         Weight:       250.5 lb Date of Birth:  May 28, 1960         BSA:          2.225 m Patient Age:    62 years           BP:           109/70 mmHg Patient Gender: F  HR:           105 bpm. Exam Location:  Inpatient Procedure: 2D Echo, Cardiac Doppler, Color Doppler and Intracardiac            Opacification Agent (Both Spectral and Color Flow Doppler were            utilized during procedure). Indications:    Pulmonary Embolus I26.09, CHF I50.9  History:        Patient has no prior history of Echocardiogram examinations.                 Signs/Symptoms:Shortness of Breath.  Sonographer:    Thea Norlander RCS Referring Phys: TOBIE MODEST, V IMPRESSIONS  1. Left ventricular ejection fraction, by estimation, is 20 to 25%. The left ventricle has severely decreased  function. The left ventricle demonstrates global hypokinesis. The left ventricular internal cavity size was moderately dilated. Left ventricular diastolic parameters are indeterminate.  2. Right ventricular systolic function is severely reduced. The right ventricular size is normal. There is moderately elevated pulmonary artery systolic pressure. The estimated right ventricular systolic pressure is 47.9 mmHg.  3. Left atrial size was moderately dilated.  4. Right atrial size was moderately dilated.  5. There is no evidence of cardiac tamponade.  6. The mitral valve is normal in structure. Severe mitral valve regurgitation. No evidence of mitral stenosis.  7. Tricuspid valve regurgitation is severe.  8. The aortic valve is normal in structure. Aortic valve regurgitation is not visualized. No aortic stenosis is present.  9. The inferior vena cava is dilated in size with <50% respiratory variability, suggesting right atrial pressure of 15 mmHg. FINDINGS  Left Ventricle: Left ventricular ejection fraction, by estimation, is 20 to 25%. The left ventricle has severely decreased function. The left ventricle demonstrates global hypokinesis. Definity  contrast agent was given IV to delineate the left ventricular endocardial borders. The left ventricular internal cavity size was moderately dilated. There is no left ventricular hypertrophy. Left ventricular diastolic parameters are indeterminate. Right Ventricle: The right ventricular size is normal. No increase in right ventricular wall thickness. Right ventricular systolic function is severely reduced. There is moderately elevated pulmonary artery systolic pressure. The tricuspid regurgitant velocity is 2.87 m/s, and with an assumed right atrial pressure of 15 mmHg, the estimated right ventricular systolic pressure is 47.9 mmHg. Left Atrium: Left atrial size was moderately dilated. Right Atrium: Right atrial size was moderately dilated. Pericardium: Trivial pericardial  effusion is present. The pericardial effusion is posterior to the left ventricle. There is no evidence of cardiac tamponade. Mitral Valve: The mitral valve is normal in structure. Severe mitral valve regurgitation, with centrally-directed jet. No evidence of mitral valve stenosis. Tricuspid Valve: The tricuspid valve is normal in structure. Tricuspid valve regurgitation is severe. No evidence of tricuspid stenosis. Aortic Valve: The aortic valve is normal in structure. Aortic valve regurgitation is not visualized. No aortic stenosis is present. Aortic valve peak gradient measures 8.8 mmHg. Pulmonic Valve: The pulmonic valve was normal in structure. Pulmonic valve regurgitation is trivial. No evidence of pulmonic stenosis. Aorta: The aortic root is normal in size and structure. Venous: The inferior vena cava is dilated in size with less than 50% respiratory variability, suggesting right atrial pressure of 15 mmHg. IAS/Shunts: No atrial level shunt detected by color flow Doppler.  LEFT VENTRICLE PLAX 2D LVIDd:         6.40 cm   Diastology LVIDs:         5.30 cm  LV e' medial:    6.74 cm/s LV PW:         1.10 cm   LV E/e' medial:  13.9 LV IVS:        1.00 cm   LV e' lateral:   15.10 cm/s LVOT diam:     2.00 cm   LV E/e' lateral: 6.2 LV SV:         46 LV SV Index:   21 LVOT Area:     3.14 cm  RIGHT VENTRICLE             IVC RV S prime:     13.90 cm/s  IVC diam: 3.10 cm TAPSE (M-mode): 2.0 cm LEFT ATRIUM             Index        RIGHT ATRIUM           Index LA diam:        5.20 cm 2.34 cm/m   RA Area:     18.10 cm LA Vol (A2C):   67.5 ml 30.33 ml/m  RA Volume:   51.55 ml  23.17 ml/m LA Vol (A4C):   77.9 ml 35.01 ml/m LA Biplane Vol: 76.3 ml 34.29 ml/m  AORTIC VALVE AV Area (Vmax): 2.29 cm AV Vmax:        148.00 cm/s AV Peak Grad:   8.8 mmHg LVOT Vmax:      108.00 cm/s LVOT Vmean:     68.000 cm/s LVOT VTI:       0.148 m  AORTA Ao Root diam: 3.10 cm Ao Asc diam:  3.30 cm MITRAL VALVE               TRICUSPID VALVE  MV Area (PHT): 5.88 cm    TR Peak grad:   32.9 mmHg MV Decel Time: 129 msec    TR Vmax:        287.00 cm/s MV E velocity: 93.80 cm/s MV A velocity: 52.20 cm/s  SHUNTS MV E/A ratio:  1.80        Systemic VTI:  0.15 m                            Systemic Diam: 2.00 cm Toribio Fuel MD Electronically signed by Toribio Fuel MD Signature Date/Time: 10/09/2023/1:35:18 PM    Final    VAS US  LOWER EXTREMITY VENOUS (DVT) Result Date: 10/09/2023  Lower Venous DVT Study Patient Name:  Heidi Sexton [facility]  Date of Exam:   10/09/2023 Medical Rec #: [id number]           Accession #:    [id number] Date of Birth: 1960-04-02          Patient Gender: F Patient Age:   78 years Exam Location:  [facility] Procedure:      VAS US  LOWER EXTREMITY VENOUS (DVT) Referring Phys: EKTA PATEL --------------------------------------------------------------------------------  Indications: Edema.  Limitations: Body habitus. Performing Technologist: Elmarie Lindau, RVT  Examination Guidelines: A complete evaluation includes B-mode imaging, spectral Doppler, color Doppler, and power Doppler as needed of all accessible portions of each vessel. Bilateral testing is considered an integral part of a complete examination. Limited examinations for reoccurring indications may be performed as noted. The reflux portion of the exam is performed with the patient in reverse Trendelenburg.  +---------+---------------+---------+-----------+----------+--------------+ RIGHT    CompressibilityPhasicitySpontaneityPropertiesThrombus Aging +---------+---------------+---------+-----------+----------+--------------+ CFV      Full  Yes      Yes                                 +---------+---------------+---------+-----------+----------+--------------+ SFJ      Full                                                        +---------+---------------+---------+-----------+----------+--------------+ FV Prox  Full                                                         +---------+---------------+---------+-----------+----------+--------------+ FV Mid   Full                                                        +---------+---------------+---------+-----------+----------+--------------+ FV DistalFull                                                        +---------+---------------+---------+-----------+----------+--------------+ PFV      Full                                                        +---------+---------------+---------+-----------+----------+--------------+ POP      Full           Yes      Yes                                 +---------+---------------+---------+-----------+----------+--------------+ PTV      Full                                                        +---------+---------------+---------+-----------+----------+--------------+ PERO     Full                                                        +---------+---------------+---------+-----------+----------+--------------+   +---------+---------------+---------+-----------+----------+--------------+ LEFT     CompressibilityPhasicitySpontaneityPropertiesThrombus Aging +---------+---------------+---------+-----------+----------+--------------+ CFV      Full           Yes      Yes                                 +---------+---------------+---------+-----------+----------+--------------+ SFJ  Full                                                        +---------+---------------+---------+-----------+----------+--------------+ FV Prox  Full                                                        +---------+---------------+---------+-----------+----------+--------------+ FV Mid   Full                                                        +---------+---------------+---------+-----------+----------+--------------+ FV DistalFull                                                         +---------+---------------+---------+-----------+----------+--------------+ PFV      Full                                                        +---------+---------------+---------+-----------+----------+--------------+ POP      Full           Yes      Yes                                 +---------+---------------+---------+-----------+----------+--------------+ PTV      Full                                                        +---------+---------------+---------+-----------+----------+--------------+ PERO     Full                                                        +---------+---------------+---------+-----------+----------+--------------+     Summary: RIGHT: - There is no evidence of deep vein thrombosis in the lower extremity.  - No cystic structure found in the popliteal fossa.  LEFT: - There is no evidence of deep vein thrombosis in the lower extremity.  - No cystic structure found in the popliteal fossa.  *See table(s) above for measurements and observations.    Preliminary    US  Abdomen Limited RUQ (LIVER/GB) Result Date: 10/09/2023 CLINICAL DATA:  Transaminitis EXAM: ULTRASOUND ABDOMEN LIMITED RIGHT UPPER QUADRANT COMPARISON:  CT 10/12/2013 FINDINGS: Gallbladder: No gallstones or wall thickening visualized. No sonographic Murphy sign noted by sonographer. Common bile duct: Diameter: 2.6 mm, normal. Liver: No focal lesion  identified. Within normal limits in parenchymal echogenicity. Portal vein is patent on color Doppler imaging with normal direction of blood flow towards the liver. Other: None. IMPRESSION: Normal right upper quadrant ultrasound. Electronically Signed   By: Oneil Officer M.D.   On: 10/09/2023 10:00     Medications:     Current Medications:  fentaNYL  (SUBLIMAZE ) injection  50 mcg Intravenous Once   gabapentin   300 mg Oral TID   sodium chloride  flush  3 mL Intravenous Q12H    Infusions:  heparin  1,400 Units/hr (10/09/23 1154)      Patient  Profile   Heidi Sexton is a 64 y.o. female with hx of bilateral breast cancer s/p mastectomy and chemo (adriamycin and cytoxan 96', taxotere  carboplatin  and taxol  15'-16'), s/p bilateral salpingo-oophorectomy. Admitted with PE, found to have acute systolic heart failure on echo.   Assessment/Plan   Acute systolic heart failure with severe RV dysfunction - Echo this admission with EF 20-25%, LV with GHK, RV severely reduced, LA/RA mod dilated, severe MR/TR - Doubt chemo induced, last chemo was in 2016.  Need to rule out ICM - NYHA IV on admission - Volume difficult to gauge.  Has been diuresing well with IV Lasix .  Will do another 40 Mg IV Lasix  this afternoon. - Start Spiro 12.5 mg daily - Start digoxin  0.125 mcg/daily - Check TSH, HIV negative, anemia panel (with ongoing fatigue).  Check lactic acid. - Plan L/RHC to better assess right-sided pressures and CO.  Need to rule out ICM. - Consider cMRI - Place TED hose - Consult dietary team for healthy eating tips. Informed Consent   Shared Decision Making/Informed Consent The risks [stroke (1 in 1000), death (1 in 1000), kidney failure [usually temporary] (1 in 500), bleeding (1 in 200), allergic reaction [possibly serious] (1 in 200)], benefits (diagnostic support and management of coronary artery disease) and alternatives of a cardiac catheterization were discussed in detail with Ms. Lewison and she is willing to proceed.      PE - CT yesterday consistent with RLL subsegmental embolus - Continue heparin  gtt  Valvular heart disease - Severe MR and TR on echo this admission - Follow with diuresis, may need intervention  Chest tightness - HsTrop 29>29> 26> 26 - Suspect demand ischemia in setting of volume overload - Would need to rule out ICM - Check lipid panel - No chest pain/tightness on exam  Elevated LFTs - AST/ALT 58/148, down trending - Hepatic congestion? - RUQ ABD US  with no acute findings  Hx bilateral breast  cancer, s/p mastectomy and BSO - Followed by Dr. Gudena - s/p adriamycin and cytoxan 96', taxotere  carboplatin  and taxol  15'-16'  Length of Stay: 1  Beckey LITTIE Coe, NP  10/09/2023, 1:47 PM   Advanced Heart Failure Team Pager [phone] (M-F; 7a - 5p)  Please contact [facility] for night-coverage after hours (4p -7a ) and weekends on [email or web address]   Patient seen and examined with the above-signed Advanced Practice Provider and/or Housestaff. I personally reviewed laboratory data, imaging studies and relevant notes. I independently examined the patient and formulated the important aspects of the plan. I have edited the note to reflect any of my changes or salient points. I have personally discussed the plan with the patient and/or family.   63 y/o woman as above with h/o breast CA s/p chemo (including adriamycin) admitted with several months HF symptoms.   Admit CT with small PE  Echo today EF 20-25% global HK moderate RV Hk severe  functional MR/TR  General:  Obese woman sitting up in bed  No resp difficulty HEENT: normal Neck: supple. JVP to jaw . Cor: PMI nondisplaced. Regular rate & rhythm. 2/6 MR/TR Lungs: clear Abdomen: soft, nontender, nondistended. No hepatosplenomegaly. No bruits or masses. Good bowel sounds. Extremities: no cyanosis, clubbing, rash, 1+ edema Neuro: alert & orientedx3, cranial nerves grossly intact. moves all 4 extremities w/o difficulty. Affect pleasant  Suspect she may have chemo related cardiac toxicity with biventricular dysfunction and severe functional MR/TR  Diurese. Titrate GDMT. R/L cath tomorrow followed by cMRI   Continue AC for PE  Move to 2C given severity of cardiac dysfunction  Toribio Fuel, MD  6:30 PM

## 2023-10-09 NOTE — Consult Note (Addendum)
 Advanced Heart Failure Team Consult Note   Primary Physician: Ransom Other, MD Cardiologist:  None  Reason for Consultation: acute systolic heart failure with RV dysfunction  HPI:    Heidi Sexton is seen today for evaluation of acute systolic heart failure with RV dysfunction at the request of Dr. Brayton.   Heidi Sexton is a 63 y.o. female with hx of bilateral breast cancer s/p mastectomy and chemo (adriamycin and cytoxan 96', taxotere  carboplatin  and taxol  15'-16'), s/p bilateral salpingo-oophorectomy.  Heidi Sexton initially had SOB that began 4 months ago.  She reports doing water  aerobics with her husband 4 times a week and she would have to take frequent breaks or sit at the edge of the pool to catch her breath.  She has been having to sleep in a recliner so that she would not bother her husband because she kept getting up as she was unable to lay flat.  She would get significantly SOB with walking outside of her house to her car.  She presented to her PCP 2 months ago with shortness of breath and chest tightness.  At that time her symptoms were attributed to allergies, so she was just treated for that.  Last week she went back to her PCP for further workup and mentioned BLE edema, which was new for her, so they ran some blood work and checked a CXR.  D-dimer showed to be elevated so she was sent to [facility] for further eval.  Since then her workup has revealed PE.  Admission labs reviewed K3.9, SCR 0.97, proBNP 4.5K, HsTrop 29>29>26>26, AST and ALT mildly elevated, HIV negative.  RUQ US  with no acute findings. Echo showed EF 20-25%, LV with GHK, RV severely reduced, LA/RA mod dilated, severe MR/TR. HF team to see for further workup.  Daughter and husband at bedside.  Reports no family history of heart disease.  Quit smoking 30 years ago.  Used to smoke marijuana up until 4 months ago to help with her eating.  Does not cook with too much salt at home.  Drinks~96  oz of fluid at home daily.   Home Medications Prior to Admission medications   Medication Sig Start Date End Date Taking? Authorizing Provider  Cholecalciferol (VITAMIN D) 2000 UNITS tablet Take 2,000 Units by mouth daily.   Yes [provider]  gabapentin  (NEURONTIN ) 300 MG capsule TAKE 1 CAPSULE(300 MG) BY MOUTH THREE TIMES DAILY 05/31/20  Yes Gudena, Vinay, MD  loratadine (CLARITIN) 10 MG tablet Take 10 mg by mouth daily as needed for allergies.   Yes [provider]  oxyCODONE  (OXY IR/ROXICODONE ) 5 MG immediate release tablet Take 1-2 tablets (5-10 mg total) by mouth every 6 (six) hours as needed for moderate pain, severe pain or breakthrough pain. Patient not taking: Reported on 10/05/2015 09/26/15   Aron Shoulders, MD    Past Medical History: Past Medical History:  Diagnosis Date   Anxiety    h/o panic attacks - relative to migranie headaches -pt denies? anxiety related to Chemotherapy   Breast cancer [facility])    both breasts 4'16- Cancer rt.breast '2015-with chemo last Tx. 4'16-Dr. Livesay follows.   Family history of anesthesia complication    daughter got nauseated also   H/O seasonal allergies    OTC   Heart murmur    during pregnancy only- then resolved    Migraine    maybe 2/yr (11/16/2013)   PONV (postoperative nausea and vomiting)    Wears glasses  Past Surgical History: Past Surgical History:  Procedure Laterality Date   ABDOMINAL HYSTERECTOMY  2011   bso   BREAST BIOPSY Left 1996   BREAST BIOPSY Right 09/2013   BREAST RECONSTRUCTION Left 2001   BREAST RECONSTRUCTION Right 11/16/2013   Procedure: BREAST RECONSTRUCTION;  Surgeon: Alm Sick, MD;  Location: [facility] OR;  Service: Plastics;  Laterality: Right;   BREAST SURGERY  1996   left mast-axillary node dissection   COLONOSCOPY WITH PROPOFOL  N/A 06/05/2015   Procedure: COLONOSCOPY WITH PROPOFOL ;  Surgeon: Gladis MARLA Louder, MD;  Location: [facility];  Service: Endoscopy;  Laterality: N/A;    LATISSIMUS FLAP TO BREAST Right 11/16/2013   Procedure: LATISSIMUS FLAP TO BREAST WITH SALINE IMPLANTS;  Surgeon: Alm Sick, MD;  Location: [facility] OR;  Service: Plastics;  Laterality: Right;/ Tram Flap on Left.   MASTECTOMY Left 1996   MASTECTOMY COMPLETE / SIMPLE W/ SENTINEL NODE BIOPSY Right 11/16/2013   MASTECTOMY W/ SENTINEL NODE BIOPSY Right 11/16/2013   Procedure: RIGHT MASTECTOMY WITH SENTINEL LYMPH NODE BIOPSY;  Surgeon: Jina Nephew, MD;  Location: [facility] OR;  Service: General;  Laterality: Right;   PORT-A-CATH REMOVAL  1996   PORT-A-CATH REMOVAL Right 09/26/2015   Procedure: REMOVAL PORT-A-CATH;  Surgeon: Jina Nephew, MD;  Location: [facility];  Service: General;  Laterality: Right;   PORTACATH PLACEMENT  1996   PORTACATH PLACEMENT Right 12/27/2013   Procedure: INSERTION PORT-A-CATH;  Surgeon: Jina Nephew, MD;  Location: [facility];  Service: General;  Laterality: Right;   RECONSTRUCTION BREAST W/ LATISSIMUS DORSI FLAP Right 11/16/2013   w/saline implants   TUNNELED VENOUS CATHETER PLACEMENT  1996   [place]; removed later in 1996    Family History: Family History  Problem Relation Age of Onset   Liver cancer Mother     Social History: Social History   Socioeconomic History   Marital status: Married    Spouse name: Not on file   Number of children: Not on file   Years of education: Not on file   Highest education level: Not on file  Occupational History   Not on file  Tobacco Use   Smoking status: Former    Current packs/day: 0.00    Average packs/day: 0.1 packs/day for 15.0 years (1.5 ttl pk-yrs)    Types: Cigarettes    Start date: 11/12/1978    Quit date: 11/11/1993    Years since quitting: 29.9   Smokeless tobacco: Never  Substance and Sexual Activity   Alcohol use: Yes    Comment: 11/16/2013 glass of wine maybe twice/month, if that   Drug use: No   Sexual activity: Not Currently  Other Topics Concern   Not on file  Social History  Narrative   Not on file   Social Drivers of Health   Financial Resource Strain: Not on file  Food Insecurity: No Food Insecurity (10/08/2023)   Hunger Vital Sign    Worried About Running Out of Food in the Last Year: Never true    Ran Out of Food in the Last Year: Never true  Transportation Needs: No Transportation Needs (10/08/2023)   PRAPARE - Administrator, Civil Service (Medical): No    Lack of Transportation (Non-Medical): No  Physical Activity: Not on file  Stress: Not on file  Social Connections: Not on file    Allergies:  Allergies  Allergen Reactions   Adhesive [Tape] Hives   Aleve [Naproxen] Itching   Advil [Ibuprofen]     Itchy  red face   Nsaids     Objective:    Vital Signs:   Temp:  [97.6 F (36.4 C)-97.8 F (36.6 C)] 97.8 F (36.6 C) (08/21 0400) Pulse Rate:  [79-105] 79 (08/21 0400) Resp:  [16-20] 19 (08/21 0400) BP: (109-124)/(50-72) 109/70 (08/21 0400) SpO2:  [93 %-99 %] 96 % (08/21 0400) Weight:  [113.6 kg] 113.6 kg (08/21 0500)    Weight change: Filed Weights   10/08/23 0152 10/09/23 0500  Weight: 113.4 kg 113.6 kg    Intake/Output:   Intake/Output Summary (Last 24 hours) at 10/09/2023 1347 Last data filed at 10/09/2023 0630 Gross per 24 hour  Intake 1345.51 ml  Output 2800 ml  Net -1454.49 ml    Physical Exam    General: Well appearing.  No respiratory difficulty Neck: JVD difficult to see, does not appear elevated.  Cor: Regular rate & rhythm.  2/6 MR murmur. Lungs: clear Extremities: +1 BL ankle edema  Neuro: alert & oriented x 3. Affect pleasant.   Telemetry   NSR 90s, 0-1 PVCs personally reviewed  EKG    NSR, consider hypertrophy. T wave abnormality  Labs   Basic Metabolic Panel: Recent Labs  Lab 10/08/23 0020 10/08/23 1714 10/09/23 0437  NA 138  --  140  K 3.9  --  3.6  CL 106  --  101  CO2 20*  --  25  GLUCOSE 115*  --  99  BUN 17  --  10  CREATININE 0.97  --  0.93  CALCIUM 8.8*  --  8.6*  MG   --  1.8  --     Liver Function Tests: Recent Labs  Lab 10/08/23 1714 10/09/23 0437  AST 81* 58*  ALT 173* 148*  ALKPHOS 107 96  BILITOT 2.1* 2.4*  PROT 6.5 6.3*  ALBUMIN 3.3* 3.2*   No results for input(s): LIPASE, AMYLASE in the last 168 hours. No results for input(s): AMMONIA in the last 168 hours.  CBC: Recent Labs  Lab 10/08/23 0020 10/09/23 0437  WBC 6.8 7.8  NEUTROABS 4.1  --   HGB 10.2* 10.6*  HCT 32.2* 34.0*  MCV 87.0 86.5  PLT 269 260    Cardiac Enzymes: No results for input(s): CKTOTAL, CKMB, CKMBINDEX, TROPONINI in the last 168 hours.  BNP: BNP (last 3 results) No results for input(s): BNP in the last 8760 hours.  ProBNP (last 3 results) Recent Labs    10/08/23 0020  PROBNP 4,583.0*     CBG: No results for input(s): GLUCAP in the last 168 hours.  Coagulation Studies: Recent Labs    10/09/23 0801  LABPROT 15.4*  INR 1.2     Imaging   ECHOCARDIOGRAM COMPLETE Result Date: 10/09/2023    ECHOCARDIOGRAM REPORT   Patient Name:   ARLETHA MARSCHKE Date of Exam: 10/09/2023 Medical Rec #:  [id number]          Height:       67.0 in Accession #:    [id number]         Weight:       250.5 lb Date of Birth:  May 28, 1960         BSA:          2.225 m Patient Age:    62 years           BP:           109/70 mmHg Patient Gender: F  HR:           105 bpm. Exam Location:  Inpatient Procedure: 2D Echo, Cardiac Doppler, Color Doppler and Intracardiac            Opacification Agent (Both Spectral and Color Flow Doppler were            utilized during procedure). Indications:    Pulmonary Embolus I26.09, CHF I50.9  History:        Patient has no prior history of Echocardiogram examinations.                 Signs/Symptoms:Shortness of Breath.  Sonographer:    Thea Norlander RCS Referring Phys: TOBIE MODEST, V IMPRESSIONS  1. Left ventricular ejection fraction, by estimation, is 20 to 25%. The left ventricle has severely decreased  function. The left ventricle demonstrates global hypokinesis. The left ventricular internal cavity size was moderately dilated. Left ventricular diastolic parameters are indeterminate.  2. Right ventricular systolic function is severely reduced. The right ventricular size is normal. There is moderately elevated pulmonary artery systolic pressure. The estimated right ventricular systolic pressure is 47.9 mmHg.  3. Left atrial size was moderately dilated.  4. Right atrial size was moderately dilated.  5. There is no evidence of cardiac tamponade.  6. The mitral valve is normal in structure. Severe mitral valve regurgitation. No evidence of mitral stenosis.  7. Tricuspid valve regurgitation is severe.  8. The aortic valve is normal in structure. Aortic valve regurgitation is not visualized. No aortic stenosis is present.  9. The inferior vena cava is dilated in size with <50% respiratory variability, suggesting right atrial pressure of 15 mmHg. FINDINGS  Left Ventricle: Left ventricular ejection fraction, by estimation, is 20 to 25%. The left ventricle has severely decreased function. The left ventricle demonstrates global hypokinesis. Definity  contrast agent was given IV to delineate the left ventricular endocardial borders. The left ventricular internal cavity size was moderately dilated. There is no left ventricular hypertrophy. Left ventricular diastolic parameters are indeterminate. Right Ventricle: The right ventricular size is normal. No increase in right ventricular wall thickness. Right ventricular systolic function is severely reduced. There is moderately elevated pulmonary artery systolic pressure. The tricuspid regurgitant velocity is 2.87 m/s, and with an assumed right atrial pressure of 15 mmHg, the estimated right ventricular systolic pressure is 47.9 mmHg. Left Atrium: Left atrial size was moderately dilated. Right Atrium: Right atrial size was moderately dilated. Pericardium: Trivial pericardial  effusion is present. The pericardial effusion is posterior to the left ventricle. There is no evidence of cardiac tamponade. Mitral Valve: The mitral valve is normal in structure. Severe mitral valve regurgitation, with centrally-directed jet. No evidence of mitral valve stenosis. Tricuspid Valve: The tricuspid valve is normal in structure. Tricuspid valve regurgitation is severe. No evidence of tricuspid stenosis. Aortic Valve: The aortic valve is normal in structure. Aortic valve regurgitation is not visualized. No aortic stenosis is present. Aortic valve peak gradient measures 8.8 mmHg. Pulmonic Valve: The pulmonic valve was normal in structure. Pulmonic valve regurgitation is trivial. No evidence of pulmonic stenosis. Aorta: The aortic root is normal in size and structure. Venous: The inferior vena cava is dilated in size with less than 50% respiratory variability, suggesting right atrial pressure of 15 mmHg. IAS/Shunts: No atrial level shunt detected by color flow Doppler.  LEFT VENTRICLE PLAX 2D LVIDd:         6.40 cm   Diastology LVIDs:         5.30 cm  LV e' medial:    6.74 cm/s LV PW:         1.10 cm   LV E/e' medial:  13.9 LV IVS:        1.00 cm   LV e' lateral:   15.10 cm/s LVOT diam:     2.00 cm   LV E/e' lateral: 6.2 LV SV:         46 LV SV Index:   21 LVOT Area:     3.14 cm  RIGHT VENTRICLE             IVC RV S prime:     13.90 cm/s  IVC diam: 3.10 cm TAPSE (M-mode): 2.0 cm LEFT ATRIUM             Index        RIGHT ATRIUM           Index LA diam:        5.20 cm 2.34 cm/m   RA Area:     18.10 cm LA Vol (A2C):   67.5 ml 30.33 ml/m  RA Volume:   51.55 ml  23.17 ml/m LA Vol (A4C):   77.9 ml 35.01 ml/m LA Biplane Vol: 76.3 ml 34.29 ml/m  AORTIC VALVE AV Area (Vmax): 2.29 cm AV Vmax:        148.00 cm/s AV Peak Grad:   8.8 mmHg LVOT Vmax:      108.00 cm/s LVOT Vmean:     68.000 cm/s LVOT VTI:       0.148 m  AORTA Ao Root diam: 3.10 cm Ao Asc diam:  3.30 cm MITRAL VALVE               TRICUSPID VALVE  MV Area (PHT): 5.88 cm    TR Peak grad:   32.9 mmHg MV Decel Time: 129 msec    TR Vmax:        287.00 cm/s MV E velocity: 93.80 cm/s MV A velocity: 52.20 cm/s  SHUNTS MV E/A ratio:  1.80        Systemic VTI:  0.15 m                            Systemic Diam: 2.00 cm Toribio Fuel MD Electronically signed by Toribio Fuel MD Signature Date/Time: 10/09/2023/1:35:18 PM    Final    VAS US  LOWER EXTREMITY VENOUS (DVT) Result Date: 10/09/2023  Lower Venous DVT Study Patient Name:  FRANCHELLE FOSKETT [facility]  Date of Exam:   10/09/2023 Medical Rec #: [id number]           Accession #:    [id number] Date of Birth: 1960-04-02          Patient Gender: F Patient Age:   78 years Exam Location:  [facility] Procedure:      VAS US  LOWER EXTREMITY VENOUS (DVT) Referring Phys: EKTA PATEL --------------------------------------------------------------------------------  Indications: Edema.  Limitations: Body habitus. Performing Technologist: Elmarie Lindau, RVT  Examination Guidelines: A complete evaluation includes B-mode imaging, spectral Doppler, color Doppler, and power Doppler as needed of all accessible portions of each vessel. Bilateral testing is considered an integral part of a complete examination. Limited examinations for reoccurring indications may be performed as noted. The reflux portion of the exam is performed with the patient in reverse Trendelenburg.  +---------+---------------+---------+-----------+----------+--------------+ RIGHT    CompressibilityPhasicitySpontaneityPropertiesThrombus Aging +---------+---------------+---------+-----------+----------+--------------+ CFV      Full  Yes      Yes                                 +---------+---------------+---------+-----------+----------+--------------+ SFJ      Full                                                        +---------+---------------+---------+-----------+----------+--------------+ FV Prox  Full                                                         +---------+---------------+---------+-----------+----------+--------------+ FV Mid   Full                                                        +---------+---------------+---------+-----------+----------+--------------+ FV DistalFull                                                        +---------+---------------+---------+-----------+----------+--------------+ PFV      Full                                                        +---------+---------------+---------+-----------+----------+--------------+ POP      Full           Yes      Yes                                 +---------+---------------+---------+-----------+----------+--------------+ PTV      Full                                                        +---------+---------------+---------+-----------+----------+--------------+ PERO     Full                                                        +---------+---------------+---------+-----------+----------+--------------+   +---------+---------------+---------+-----------+----------+--------------+ LEFT     CompressibilityPhasicitySpontaneityPropertiesThrombus Aging +---------+---------------+---------+-----------+----------+--------------+ CFV      Full           Yes      Yes                                 +---------+---------------+---------+-----------+----------+--------------+ SFJ  Full                                                        +---------+---------------+---------+-----------+----------+--------------+ FV Prox  Full                                                        +---------+---------------+---------+-----------+----------+--------------+ FV Mid   Full                                                        +---------+---------------+---------+-----------+----------+--------------+ FV DistalFull                                                         +---------+---------------+---------+-----------+----------+--------------+ PFV      Full                                                        +---------+---------------+---------+-----------+----------+--------------+ POP      Full           Yes      Yes                                 +---------+---------------+---------+-----------+----------+--------------+ PTV      Full                                                        +---------+---------------+---------+-----------+----------+--------------+ PERO     Full                                                        +---------+---------------+---------+-----------+----------+--------------+     Summary: RIGHT: - There is no evidence of deep vein thrombosis in the lower extremity.  - No cystic structure found in the popliteal fossa.  LEFT: - There is no evidence of deep vein thrombosis in the lower extremity.  - No cystic structure found in the popliteal fossa.  *See table(s) above for measurements and observations.    Preliminary    US  Abdomen Limited RUQ (LIVER/GB) Result Date: 10/09/2023 CLINICAL DATA:  Transaminitis EXAM: ULTRASOUND ABDOMEN LIMITED RIGHT UPPER QUADRANT COMPARISON:  CT 10/12/2013 FINDINGS: Gallbladder: No gallstones or wall thickening visualized. No sonographic Murphy sign noted by sonographer. Common bile duct: Diameter: 2.6 mm, normal. Liver: No focal lesion  identified. Within normal limits in parenchymal echogenicity. Portal vein is patent on color Doppler imaging with normal direction of blood flow towards the liver. Other: None. IMPRESSION: Normal right upper quadrant ultrasound. Electronically Signed   By: Oneil Officer M.D.   On: 10/09/2023 10:00     Medications:     Current Medications:  fentaNYL  (SUBLIMAZE ) injection  50 mcg Intravenous Once   gabapentin   300 mg Oral TID   sodium chloride  flush  3 mL Intravenous Q12H    Infusions:  heparin  1,400 Units/hr (10/09/23 1154)      Patient  Profile   Heidi Sexton is a 64 y.o. female with hx of bilateral breast cancer s/p mastectomy and chemo (adriamycin and cytoxan 96', taxotere  carboplatin  and taxol  15'-16'), s/p bilateral salpingo-oophorectomy. Admitted with PE, found to have acute systolic heart failure on echo.   Assessment/Plan   Acute systolic heart failure with severe RV dysfunction - Echo this admission with EF 20-25%, LV with GHK, RV severely reduced, LA/RA mod dilated, severe MR/TR - Doubt chemo induced, last chemo was in 2016.  Need to rule out ICM - NYHA IV on admission - Volume difficult to gauge.  Has been diuresing well with IV Lasix .  Will do another 40 Mg IV Lasix  this afternoon. - Start Spiro 12.5 mg daily - Start digoxin  0.125 mcg/daily - Check TSH, HIV negative, anemia panel (with ongoing fatigue).  Check lactic acid. - Plan L/RHC to better assess right-sided pressures and CO.  Need to rule out ICM. - Consider cMRI - Place TED hose - Consult dietary team for healthy eating tips. Informed Consent   Shared Decision Making/Informed Consent The risks [stroke (1 in 1000), death (1 in 1000), kidney failure [usually temporary] (1 in 500), bleeding (1 in 200), allergic reaction [possibly serious] (1 in 200)], benefits (diagnostic support and management of coronary artery disease) and alternatives of a cardiac catheterization were discussed in detail with Ms. Lewison and she is willing to proceed.      PE - CT yesterday consistent with RLL subsegmental embolus - Continue heparin  gtt  Valvular heart disease - Severe MR and TR on echo this admission - Follow with diuresis, may need intervention  Chest tightness - HsTrop 29>29> 26> 26 - Suspect demand ischemia in setting of volume overload - Would need to rule out ICM - Check lipid panel - No chest pain/tightness on exam  Elevated LFTs - AST/ALT 58/148, down trending - Hepatic congestion? - RUQ ABD US  with no acute findings  Hx bilateral breast  cancer, s/p mastectomy and BSO - Followed by Dr. Gudena - s/p adriamycin and cytoxan 96', taxotere  carboplatin  and taxol  15'-16'  Length of Stay: 1  Beckey LITTIE Coe, NP  10/09/2023, 1:47 PM   Advanced Heart Failure Team Pager [phone] (M-F; 7a - 5p)  Please contact [facility] for night-coverage after hours (4p -7a ) and weekends on [email or web address]   Patient seen and examined with the above-signed Advanced Practice Provider and/or Housestaff. I personally reviewed laboratory data, imaging studies and relevant notes. I independently examined the patient and formulated the important aspects of the plan. I have edited the note to reflect any of my changes or salient points. I have personally discussed the plan with the patient and/or family.   63 y/o woman as above with h/o breast CA s/p chemo (including adriamycin) admitted with several months HF symptoms.   Admit CT with small PE  Echo today EF 20-25% global HK moderate RV Hk severe  functional MR/TR  General:  Obese woman sitting up in bed  No resp difficulty HEENT: normal Neck: supple. JVP to jaw . Cor: PMI nondisplaced. Regular rate & rhythm. 2/6 MR/TR Lungs: clear Abdomen: soft, nontender, nondistended. No hepatosplenomegaly. No bruits or masses. Good bowel sounds. Extremities: no cyanosis, clubbing, rash, 1+ edema Neuro: alert & orientedx3, cranial nerves grossly intact. moves all 4 extremities w/o difficulty. Affect pleasant  Suspect she may have chemo related cardiac toxicity with biventricular dysfunction and severe functional MR/TR  Diurese. Titrate GDMT. R/L cath tomorrow followed by cMRI   Continue AC for PE  Move to 2C given severity of cardiac dysfunction  Toribio Fuel, MD  6:30 PM

## 2023-10-09 NOTE — Care Management (Addendum)
 Transition of Care [facility]) - Inpatient Brief Assessment   Patient Details  Name: Heidi Sexton MRN: [id number] Date of Birth: 1960/10/27  Transition of Care [facility]) CM/SW Contact:    Corean JAYSON Canary, RN Phone Number: 10/09/2023, 7:48 AM   Clinical Narrative:  63 year old patient presented with [facility]  Has a history of breast ca, bilateral mastectomy . PE, increased BNP  From home with spouse. Independent  No needs identified at this time, the patient will be discussed in daily progressive rounds, if a need is identified, please place a IP care Management consult.  Transition of Care Asessment: Insurance and Status: Insurance coverage has been reviewed Patient has primary care physician: Yes Home environment has been reviewed: Home with spouse Prior level of function:: Independent Prior/Current Home Services: No current home services Social Drivers of Health Review: SDOH reviewed no interventions necessary Readmission risk has been reviewed: Yes Transition of care needs: no transition of care needs at this time

## 2023-10-09 NOTE — Plan of Care (Signed)

## 2023-10-10 ENCOUNTER — Other Ambulatory Visit (HOSPITAL_COMMUNITY): Payer: Self-pay

## 2023-10-10 ENCOUNTER — Telehealth (HOSPITAL_COMMUNITY): Payer: Self-pay | Admitting: Pharmacy Technician

## 2023-10-10 ENCOUNTER — Inpatient Hospital Stay (HOSPITAL_COMMUNITY)

## 2023-10-10 ENCOUNTER — Encounter (HOSPITAL_COMMUNITY)
Admission: EM | Disposition: A | Discharge status: Home / Self Care | Payer: Self-pay | Source: Home / Self Care | Attending: Internal Medicine

## 2023-10-10 DIAGNOSIS — R7401 Elevation of levels of liver transaminase levels: Secondary | ICD-10-CM | POA: Diagnosis present

## 2023-10-10 DIAGNOSIS — I340 Nonrheumatic mitral (valve) insufficiency: Secondary | ICD-10-CM

## 2023-10-10 DIAGNOSIS — Z87891 Personal history of nicotine dependence: Secondary | ICD-10-CM | POA: Diagnosis not present

## 2023-10-10 DIAGNOSIS — I5021 Acute systolic (congestive) heart failure: Secondary | ICD-10-CM | POA: Diagnosis present

## 2023-10-10 DIAGNOSIS — Z886 Allergy status to analgesic agent status: Secondary | ICD-10-CM | POA: Diagnosis not present

## 2023-10-10 DIAGNOSIS — I5020 Unspecified systolic (congestive) heart failure: Secondary | ICD-10-CM | POA: Diagnosis not present

## 2023-10-10 DIAGNOSIS — I428 Other cardiomyopathies: Secondary | ICD-10-CM | POA: Diagnosis present

## 2023-10-10 DIAGNOSIS — Z923 Personal history of irradiation: Secondary | ICD-10-CM | POA: Diagnosis not present

## 2023-10-10 DIAGNOSIS — D509 Iron deficiency anemia, unspecified: Secondary | ICD-10-CM

## 2023-10-10 DIAGNOSIS — I361 Nonrheumatic tricuspid (valve) insufficiency: Secondary | ICD-10-CM

## 2023-10-10 DIAGNOSIS — I071 Rheumatic tricuspid insufficiency: Secondary | ICD-10-CM

## 2023-10-10 DIAGNOSIS — Z853 Personal history of malignant neoplasm of breast: Secondary | ICD-10-CM | POA: Diagnosis not present

## 2023-10-10 DIAGNOSIS — Z800 Family history of malignant neoplasm of digestive organs: Secondary | ICD-10-CM | POA: Diagnosis not present

## 2023-10-10 DIAGNOSIS — Z9221 Personal history of antineoplastic chemotherapy: Secondary | ICD-10-CM | POA: Diagnosis not present

## 2023-10-10 DIAGNOSIS — I2510 Atherosclerotic heart disease of native coronary artery without angina pectoris: Secondary | ICD-10-CM | POA: Diagnosis present

## 2023-10-10 DIAGNOSIS — Z79899 Other long term (current) drug therapy: Secondary | ICD-10-CM | POA: Diagnosis not present

## 2023-10-10 DIAGNOSIS — I2693 Single subsegmental pulmonary embolism without acute cor pulmonale: Secondary | ICD-10-CM | POA: Diagnosis present

## 2023-10-10 DIAGNOSIS — E66812 Obesity, class 2: Secondary | ICD-10-CM | POA: Diagnosis present

## 2023-10-10 DIAGNOSIS — Z91048 Other nonmedicinal substance allergy status: Secondary | ICD-10-CM | POA: Diagnosis not present

## 2023-10-10 DIAGNOSIS — Z7901 Long term (current) use of anticoagulants: Secondary | ICD-10-CM | POA: Diagnosis not present

## 2023-10-10 DIAGNOSIS — Z6839 Body mass index (BMI) 39.0-39.9, adult: Secondary | ICD-10-CM | POA: Diagnosis not present

## 2023-10-10 DIAGNOSIS — Z9013 Acquired absence of bilateral breasts and nipples: Secondary | ICD-10-CM | POA: Diagnosis not present

## 2023-10-10 DIAGNOSIS — F419 Anxiety disorder, unspecified: Secondary | ICD-10-CM | POA: Diagnosis present

## 2023-10-10 DIAGNOSIS — I2699 Other pulmonary embolism without acute cor pulmonale: Secondary | ICD-10-CM | POA: Diagnosis present

## 2023-10-10 HISTORY — PX: RIGHT/LEFT HEART CATH AND CORONARY ANGIOGRAPHY: CATH118266

## 2023-10-10 LAB — COMPREHENSIVE METABOLIC PANEL WITH GFR
ALT: 108 U/L — ABNORMAL HIGH (ref 0–44)
AST: 34 U/L (ref 15–41)
Albumin: 3.2 g/dL — ABNORMAL LOW (ref 3.5–5.0)
Alkaline Phosphatase: 93 U/L (ref 38–126)
Anion gap: 12 (ref 5–15)
BUN: 15 mg/dL (ref 6–20)
CO2: 25 mmol/L (ref 22–32)
Calcium: 9.2 mg/dL (ref 8.9–10.3)
Chloride: 100 mmol/L (ref 98–111)
Creatinine, Ser: 0.89 mg/dL (ref 0.44–1.00)
GFR, Estimated: >60 mL/min (ref >60)
Glucose, Bld: 110 mg/dL — ABNORMAL HIGH (ref 70–99)
Potassium: 3.7 mmol/L (ref 3.5–5.1)
Sodium: 137 mmol/L (ref 135–145)
Total Bilirubin: 1.2 mg/dL (ref 0.0–1.2)
Total Protein: 6.6 g/dL (ref 6.5–8.1)

## 2023-10-10 LAB — POCT I-STAT 7, (LYTES, BLD GAS, ICA,H+H)
Acid-Base Excess: 0 mmol/L (ref 0.0–2.0)
Acid-Base Excess: 4 mmol/L — ABNORMAL HIGH (ref 0.0–2.0)
Acid-base deficit: 3 mmol/L — ABNORMAL HIGH (ref 0.0–2.0)
Bicarbonate: 20.9 mmol/L (ref 20.0–28.0)
Bicarbonate: 25.2 mmol/L (ref 20.0–28.0)
Bicarbonate: 28.4 mmol/L — ABNORMAL HIGH (ref 20.0–28.0)
Calcium, Ion: 0.78 mmol/L — CL (ref 1.15–1.40)
Calcium, Ion: 1 mmol/L — ABNORMAL LOW (ref 1.15–1.40)
Calcium, Ion: 1.18 mmol/L (ref 1.15–1.40)
HCT: 24 % — ABNORMAL LOW (ref 36.0–46.0)
HCT: 31 % — ABNORMAL LOW (ref 36.0–46.0)
HCT: 33 % — ABNORMAL LOW (ref 36.0–46.0)
Hemoglobin: 10.5 g/dL — ABNORMAL LOW (ref 12.0–15.0)
Hemoglobin: 11.2 g/dL — ABNORMAL LOW (ref 12.0–15.0)
Hemoglobin: 8.2 g/dL — ABNORMAL LOW (ref 12.0–15.0)
O2 Saturation: 61 %
O2 Saturation: 68 %
O2 Saturation: 94 %
Potassium: 2.6 mmol/L — CL (ref 3.5–5.1)
Potassium: 3.2 mmol/L — ABNORMAL LOW (ref 3.5–5.1)
Potassium: 3.6 mmol/L (ref 3.5–5.1)
Sodium: 134 mmol/L — ABNORMAL LOW (ref 135–145)
Sodium: 138 mmol/L (ref 135–145)
Sodium: 148 mmol/L — ABNORMAL HIGH (ref 135–145)
TCO2: 22 mmol/L (ref 22–32)
TCO2: 27 mmol/L (ref 22–32)
TCO2: 30 mmol/L (ref 22–32)
pCO2 arterial: 31.1 mmHg — ABNORMAL LOW (ref 32–48)
pCO2 arterial: 41.7 mmHg (ref 32–48)
pCO2 arterial: 42.3 mmHg (ref 32–48)
pH, Arterial: 7.383 (ref 7.35–7.45)
pH, Arterial: 7.435 (ref 7.35–7.45)
pH, Arterial: 7.441 (ref 7.35–7.45)
pO2, Arterial: 32 mmHg — CL (ref 83–108)
pO2, Arterial: 34 mmHg — CL (ref 83–108)
pO2, Arterial: 66 mmHg — ABNORMAL LOW (ref 83–108)

## 2023-10-10 LAB — IRON AND TIBC
Iron: 31 ug/dL (ref 28–170)
Saturation Ratios: 6 % — ABNORMAL LOW (ref 10.4–31.8)
TIBC: 482 ug/dL — ABNORMAL HIGH (ref 250–450)
UIBC: 451 ug/dL

## 2023-10-10 LAB — RETICULOCYTES
Immature Retic Fract: 23.5 % — ABNORMAL HIGH (ref 2.3–15.9)
RBC.: 3.95 MIL/uL (ref 3.87–5.11)
Retic Count, Absolute: 117.7 K/uL (ref 19.0–186.0)
Retic Ct Pct: 3 % (ref 0.4–3.1)

## 2023-10-10 LAB — FOLATE: Folate: 9.9 ng/mL (ref >5.9)

## 2023-10-10 LAB — CBC
HCT: 34 % — ABNORMAL LOW (ref 36.0–46.0)
Hemoglobin: 10.7 g/dL — ABNORMAL LOW (ref 12.0–15.0)
MCH: 27 pg (ref 26.0–34.0)
MCHC: 31.5 g/dL (ref 30.0–36.0)
MCV: 85.9 fL (ref 80.0–100.0)
Platelets: 306 K/uL (ref 150–400)
RBC: 3.96 MIL/uL (ref 3.87–5.11)
RDW: 15.8 % — ABNORMAL HIGH (ref 11.5–15.5)
WBC: 7.9 K/uL (ref 4.0–10.5)
nRBC: 0 % (ref 0.0–0.2)

## 2023-10-10 LAB — FERRITIN: Ferritin: 66 ng/mL (ref 11–307)

## 2023-10-10 LAB — HEPARIN LEVEL (UNFRACTIONATED): Heparin Unfractionated: 0.17 [IU]/mL — ABNORMAL LOW (ref 0.30–0.70)

## 2023-10-10 LAB — VITAMIN B12: Vitamin B-12: 660 pg/mL (ref 180–914)

## 2023-10-10 SURGERY — RIGHT/LEFT HEART CATH AND CORONARY ANGIOGRAPHY
Anesthesia: LOCAL

## 2023-10-10 MED ORDER — FREE WATER: 500.0000 mL | Freq: Once | Status: DC

## 2023-10-10 MED ORDER — SODIUM CHLORIDE 0.9 % IV SOLN
250.0000 mL | INTRAVENOUS | Status: AC | PRN
Start: 2023-10-10 — End: 2023-10-11

## 2023-10-10 MED ORDER — APIXABAN 5 MG PO TABS: 5.0000 mg | ORAL_TABLET | Freq: Two times a day (BID) | ORAL | Status: DC

## 2023-10-10 MED ORDER — HEPARIN (PORCINE) IN NACL 1000-0.9 UT/500ML-% IV SOLN
INTRAVENOUS | Status: DC | PRN
  Administered 2023-10-10 (×2): 500 mL

## 2023-10-10 MED ORDER — IOHEXOL 350 MG/ML SOLN
INTRAVENOUS | Status: DC | PRN
  Administered 2023-10-10: 65 mL

## 2023-10-10 MED ORDER — LOSARTAN POTASSIUM 25 MG PO TABS
12.5000 mg | ORAL_TABLET | Freq: Every day | ORAL | Status: DC
  Administered 2023-10-10 – 2023-10-11 (×2): 12.5 mg via ORAL
  Filled 2023-10-10 (×2): qty 1

## 2023-10-10 MED ORDER — FENTANYL CITRATE (PF) 100 MCG/2ML IJ SOLN
INTRAMUSCULAR | Status: AC
  Filled 2023-10-10: qty 2

## 2023-10-10 MED ORDER — GADOBUTROL 1 MMOL/ML IV SOLN
10.0000 mL | Freq: Once | INTRAVENOUS | Status: AC | PRN
  Administered 2023-10-10: 10 mL via INTRAVENOUS

## 2023-10-10 MED ORDER — SODIUM CHLORIDE 0.9 % IV SOLN: 250.0000 mL | INTRAVENOUS | Status: AC | PRN

## 2023-10-10 MED ORDER — SODIUM CHLORIDE 0.9% FLUSH: 3.0000 mL | INTRAVENOUS | Status: DC | PRN

## 2023-10-10 MED ORDER — LABETALOL HCL 5 MG/ML IV SOLN: 10.0000 mg | INTRAVENOUS | Status: AC | PRN

## 2023-10-10 MED ORDER — LIDOCAINE HCL (PF) 1 % IJ SOLN
INTRAMUSCULAR | Status: DC | PRN
  Administered 2023-10-10 (×2): 5 mL via INTRADERMAL

## 2023-10-10 MED ORDER — HEPARIN SODIUM (PORCINE) 1000 UNIT/ML IJ SOLN
INTRAMUSCULAR | Status: AC
  Filled 2023-10-10: qty 10

## 2023-10-10 MED ORDER — FUROSEMIDE 10 MG/ML IJ SOLN
60.0000 mg | Freq: Once | INTRAMUSCULAR | Status: AC
  Administered 2023-10-10: 60 mg via INTRAVENOUS
  Filled 2023-10-10: qty 6

## 2023-10-10 MED ORDER — MIDAZOLAM HCL 2 MG/2ML IJ SOLN
INTRAMUSCULAR | Status: AC
Start: 2023-10-10 — End: 2023-10-10
  Filled 2023-10-10: qty 2

## 2023-10-10 MED ORDER — HEPARIN SODIUM (PORCINE) 1000 UNIT/ML IJ SOLN
INTRAMUSCULAR | Status: DC | PRN
  Administered 2023-10-10: 5000 [IU] via INTRAVENOUS

## 2023-10-10 MED ORDER — ACETAMINOPHEN 325 MG PO TABS
650.0000 mg | ORAL_TABLET | ORAL | Status: DC | PRN
Start: 2023-10-10 — End: 2023-10-10

## 2023-10-10 MED ORDER — VERAPAMIL HCL 2.5 MG/ML IV SOLN
INTRAVENOUS | Status: DC | PRN
  Administered 2023-10-10: 10 mL via INTRA_ARTERIAL

## 2023-10-10 MED ORDER — FENTANYL CITRATE (PF) 100 MCG/2ML IJ SOLN
INTRAMUSCULAR | Status: DC | PRN
  Administered 2023-10-10: 25 ug via INTRAVENOUS

## 2023-10-10 MED ORDER — SODIUM CHLORIDE 0.9% FLUSH
3.0000 mL | Freq: Two times a day (BID) | INTRAVENOUS | Status: DC
  Administered 2023-10-10 – 2023-10-11 (×3): 3 mL via INTRAVENOUS

## 2023-10-10 MED ORDER — LIDOCAINE HCL (PF) 1 % IJ SOLN
INTRAMUSCULAR | Status: AC
  Filled 2023-10-10: qty 30

## 2023-10-10 MED ORDER — APIXABAN 5 MG PO TABS
10.0000 mg | ORAL_TABLET | Freq: Two times a day (BID) | ORAL | Status: DC
  Administered 2023-10-10 – 2023-10-12 (×4): 10 mg via ORAL
  Filled 2023-10-10 (×4): qty 2

## 2023-10-10 MED ORDER — HYDRALAZINE HCL 20 MG/ML IJ SOLN
10.0000 mg | INTRAMUSCULAR | Status: AC | PRN
Start: 2023-10-10 — End: 2023-10-10

## 2023-10-10 MED ORDER — VERAPAMIL HCL 2.5 MG/ML IV SOLN
INTRAVENOUS | Status: AC
  Filled 2023-10-10: qty 2

## 2023-10-10 MED ORDER — ONDANSETRON HCL 4 MG/2ML IJ SOLN
4.0000 mg | Freq: Four times a day (QID) | INTRAMUSCULAR | Status: DC | PRN
Start: 2023-10-10 — End: 2023-10-10

## 2023-10-10 MED ORDER — SODIUM CHLORIDE 0.9% FLUSH
3.0000 mL | Freq: Two times a day (BID) | INTRAVENOUS | Status: DC
  Administered 2023-10-10 – 2023-10-11 (×3): 3 mL via INTRAVENOUS

## 2023-10-10 MED ORDER — MIDAZOLAM HCL 2 MG/2ML IJ SOLN
INTRAMUSCULAR | Status: DC | PRN
Start: 2023-10-10 — End: 2023-10-10
  Administered 2023-10-10: 1 mg via INTRAVENOUS

## 2023-10-10 MED ORDER — SODIUM CHLORIDE 0.9% FLUSH
3.0000 mL | INTRAVENOUS | Status: DC | PRN
Start: 2023-10-10 — End: 2023-10-12

## 2023-10-10 SURGICAL SUPPLY — 11 items
CATH 5FR JL3.5 JR4 ANG PIG MP (CATHETERS) IMPLANT
CATH SWAN GANZ 7F STRAIGHT (CATHETERS) IMPLANT
DEVICE RAD COMP TR BAND LRG (VASCULAR PRODUCTS) IMPLANT
GLIDESHEATH SLEND SS 6F .021 (SHEATH) IMPLANT
GUIDEWIRE INQWIRE 1.5J.035X260 (WIRE) IMPLANT
KIT HEART LEFT (KITS) IMPLANT
PACK CARDIAC CATHETERIZATION (CUSTOM PROCEDURE TRAY) ×1 IMPLANT
SHEATH PINNACLE 7F 10CM (SHEATH) IMPLANT
SHEATH PROBE COVER 6X72 (BAG) IMPLANT
TRANSDUCER W/STOPCOCK (MISCELLANEOUS) IMPLANT
TUBING CIL FLEX 10 FLL-RA (TUBING) IMPLANT

## 2023-10-10 NOTE — Progress Notes (Addendum)
 Advanced Heart Failure Rounding Note  Cardiologist: None  Chief Complaint: Acute systolic heart failuare Subjective:    Family at bedside. Denies CP/SOB.   Renal function stable. -2L UOP.   Objective:   Weight Range: 108 kg Body mass index is 37.29 kg/m.   Vital Signs:   Temp:  [97.9 F (36.6 C)-98.7 F (37.1 C)] 97.9 F (36.6 C) (08/22 0742) Pulse Rate:  [86-103] 86 (08/22 0742) Resp:  [16-20] 16 (08/22 0742) BP: (123-145)/(71-94) 123/71 (08/22 0742) SpO2:  [93 %-100 %] 93 % (08/22 0742) Weight:  [108 kg] 108 kg (08/22 0451)    Weight change: Filed Weights   10/08/23 0152 10/09/23 0500 10/10/23 0451  Weight: 113.4 kg 113.6 kg 108 kg    Intake/Output:   Intake/Output Summary (Last 24 hours) at 10/10/2023 0946 Last data filed at 10/10/2023 0839 Gross per 24 hour  Intake 836.13 ml  Output 2600 ml  Net -1763.87 ml      Physical Exam    General:  well appearing.  No respiratory difficulty Neck: JVD ~9 cm.  Cor: Regular rate & rhythm. 2/6 MR murmur. Lungs: clear Extremities: trace BLE edema  Neuro: alert & oriented x 3. Affect pleasant.   Telemetry   NSR 80s (Personally reviewed)    Labs    CBC Recent Labs    10/08/23 0020 10/09/23 0437 10/10/23 0249  WBC 6.8 7.8 7.9  NEUTROABS 4.1  --   --   HGB 10.2* 10.6* 10.7*  HCT 32.2* 34.0* 34.0*  MCV 87.0 86.5 85.9  PLT 269 260 306   Basic Metabolic Panel Recent Labs    91/79/74 1714 10/09/23 0437 10/10/23 0249  NA  --  140 137  K  --  3.6 3.7  CL  --  101 100  CO2  --  25 25  GLUCOSE  --  99 110*  BUN  --  10 15  CREATININE  --  0.93 0.89  CALCIUM  --  8.6* 9.2  MG 1.8  --   --    Liver Function Tests Recent Labs    10/09/23 0437 10/10/23 0249  AST 58* 34  ALT 148* 108*  ALKPHOS 96 93  BILITOT 2.4* 1.2  PROT 6.3* 6.6  ALBUMIN 3.2* 3.2*   No results for input(s): LIPASE, AMYLASE in the last 72 hours. Cardiac Enzymes No results for input(s): CKTOTAL, CKMB,  CKMBINDEX, TROPONINI in the last 72 hours.  BNP: BNP (last 3 results) No results for input(s): BNP in the last 8760 hours.  ProBNP (last 3 results) Recent Labs    10/08/23 0020  PROBNP 4,583.0*     D-Dimer No results for input(s): DDIMER in the last 72 hours. Hemoglobin A1C No results for input(s): HGBA1C in the last 72 hours. Fasting Lipid Panel Recent Labs    10/09/23 1650  CHOL 112  HDL 41  LDLCALC 54  TRIG 84  CHOLHDL 2.7   Thyroid Function Tests Recent Labs    10/09/23 1650  TSH 0.879    Other results:   Imaging    VAS US  LOWER EXTREMITY VENOUS (DVT) Result Date: 10/09/2023  Lower Venous DVT Study Patient Name:  Heidi Sexton [facility]  Date of Exam:   10/09/2023 Medical Rec #: [id number]           Accession #:    [id number] Date of Birth: 1960/04/21          Patient Gender: F Patient Age:   63 years Exam Location:  [facility] Procedure:      VAS US  LOWER EXTREMITY VENOUS (DVT) Referring Phys: EKTA PATEL --------------------------------------------------------------------------------  Indications: Edema.  Limitations: Body habitus. Performing Technologist: Elmarie Lindau, RVT  Examination Guidelines: A complete evaluation includes B-mode imaging, spectral Doppler, color Doppler, and power Doppler as needed of all accessible portions of each vessel. Bilateral testing is considered an integral part of a complete examination. Limited examinations for reoccurring indications may be performed as noted. The reflux portion of the exam is performed with the patient in reverse Trendelenburg.  +---------+---------------+---------+-----------+----------+--------------+ RIGHT    CompressibilityPhasicitySpontaneityPropertiesThrombus Aging +---------+---------------+---------+-----------+----------+--------------+ CFV      Full           Yes      Yes                                 +---------+---------------+---------+-----------+----------+--------------+  SFJ      Full                                                        +---------+---------------+---------+-----------+----------+--------------+ FV Prox  Full                                                        +---------+---------------+---------+-----------+----------+--------------+ FV Mid   Full                                                        +---------+---------------+---------+-----------+----------+--------------+ FV DistalFull                                                        +---------+---------------+---------+-----------+----------+--------------+ PFV      Full                                                        +---------+---------------+---------+-----------+----------+--------------+ POP      Full           Yes      Yes                                 +---------+---------------+---------+-----------+----------+--------------+ PTV      Full                                                        +---------+---------------+---------+-----------+----------+--------------+ PERO     Full                                                        +---------+---------------+---------+-----------+----------+--------------+   +---------+---------------+---------+-----------+----------+--------------+  LEFT     CompressibilityPhasicitySpontaneityPropertiesThrombus Aging +---------+---------------+---------+-----------+----------+--------------+ CFV      Full           Yes      Yes                                 +---------+---------------+---------+-----------+----------+--------------+ SFJ      Full                                                        +---------+---------------+---------+-----------+----------+--------------+ FV Prox  Full                                                        +---------+---------------+---------+-----------+----------+--------------+ FV Mid   Full                                                         +---------+---------------+---------+-----------+----------+--------------+ FV DistalFull                                                        +---------+---------------+---------+-----------+----------+--------------+ PFV      Full                                                        +---------+---------------+---------+-----------+----------+--------------+ POP      Full           Yes      Yes                                 +---------+---------------+---------+-----------+----------+--------------+ PTV      Full                                                        +---------+---------------+---------+-----------+----------+--------------+ PERO     Full                                                        +---------+---------------+---------+-----------+----------+--------------+     Summary: RIGHT: - There is no evidence of deep vein thrombosis in the lower extremity.  - No cystic structure found in the popliteal fossa.  LEFT: - There is no evidence of deep vein thrombosis in the lower extremity.  - No  cystic structure found in the popliteal fossa.  *See table(s) above for measurements and observations. Electronically signed by Debby Robertson on 10/09/2023 at 9:15:31 PM.    Final    ECHOCARDIOGRAM COMPLETE Result Date: 10/09/2023    ECHOCARDIOGRAM REPORT   Patient Name:   Heidi Sexton Date of Exam: 10/09/2023 Medical Rec #:  [id number]          Height:       67.0 in Accession #:    [id number]         Weight:       250.5 lb Date of Birth:  11-Jul-1960         BSA:          2.225 m Patient Age:    62 years           BP:           109/70 mmHg Patient Gender: F                  HR:           105 bpm. Exam Location:  Inpatient Procedure: 2D Echo, Cardiac Doppler, Color Doppler and Intracardiac            Opacification Agent (Both Spectral and Color Flow Doppler were            utilized during procedure). Indications:    Pulmonary Embolus  I26.09, CHF I50.9  History:        Patient has no prior history of Echocardiogram examinations.                 Signs/Symptoms:Shortness of Breath.  Sonographer:    Thea Norlander RCS Referring Phys: TOBIE MODEST, V IMPRESSIONS  1. Left ventricular ejection fraction, by estimation, is 20 to 25%. The left ventricle has severely decreased function. The left ventricle demonstrates global hypokinesis. The left ventricular internal cavity size was moderately dilated. Left ventricular diastolic parameters are indeterminate.  2. Right ventricular systolic function is severely reduced. The right ventricular size is normal. There is moderately elevated pulmonary artery systolic pressure. The estimated right ventricular systolic pressure is 47.9 mmHg.  3. Left atrial size was moderately dilated.  4. Right atrial size was moderately dilated.  5. There is no evidence of cardiac tamponade.  6. The mitral valve is normal in structure. Severe mitral valve regurgitation. No evidence of mitral stenosis.  7. Tricuspid valve regurgitation is severe.  8. The aortic valve is normal in structure. Aortic valve regurgitation is not visualized. No aortic stenosis is present.  9. The inferior vena cava is dilated in size with <50% respiratory variability, suggesting right atrial pressure of 15 mmHg. FINDINGS  Left Ventricle: Left ventricular ejection fraction, by estimation, is 20 to 25%. The left ventricle has severely decreased function. The left ventricle demonstrates global hypokinesis. Definity  contrast agent was given IV to delineate the left ventricular endocardial borders. The left ventricular internal cavity size was moderately dilated. There is no left ventricular hypertrophy. Left ventricular diastolic parameters are indeterminate. Right Ventricle: The right ventricular size is normal. No increase in right ventricular wall thickness. Right ventricular systolic function is severely reduced. There is moderately elevated pulmonary  artery systolic pressure. The tricuspid regurgitant velocity is 2.87 m/s, and with an assumed right atrial pressure of 15 mmHg, the estimated right ventricular systolic pressure is 47.9 mmHg. Left Atrium: Left atrial size was moderately dilated. Right Atrium: Right atrial size was moderately dilated. Pericardium: Trivial pericardial effusion is present. The  pericardial effusion is posterior to the left ventricle. There is no evidence of cardiac tamponade. Mitral Valve: The mitral valve is normal in structure. Severe mitral valve regurgitation, with centrally-directed jet. No evidence of mitral valve stenosis. Tricuspid Valve: The tricuspid valve is normal in structure. Tricuspid valve regurgitation is severe. No evidence of tricuspid stenosis. Aortic Valve: The aortic valve is normal in structure. Aortic valve regurgitation is not visualized. No aortic stenosis is present. Aortic valve peak gradient measures 8.8 mmHg. Pulmonic Valve: The pulmonic valve was normal in structure. Pulmonic valve regurgitation is trivial. No evidence of pulmonic stenosis. Aorta: The aortic root is normal in size and structure. Venous: The inferior vena cava is dilated in size with less than 50% respiratory variability, suggesting right atrial pressure of 15 mmHg. IAS/Shunts: No atrial level shunt detected by color flow Doppler.  LEFT VENTRICLE PLAX 2D LVIDd:         6.40 cm   Diastology LVIDs:         5.30 cm   LV e' medial:    6.74 cm/s LV PW:         1.10 cm   LV E/e' medial:  13.9 LV IVS:        1.00 cm   LV e' lateral:   15.10 cm/s LVOT diam:     2.00 cm   LV E/e' lateral: 6.2 LV SV:         46 LV SV Index:   21 LVOT Area:     3.14 cm  RIGHT VENTRICLE             IVC RV S prime:     13.90 cm/s  IVC diam: 3.10 cm TAPSE (M-mode): 2.0 cm LEFT ATRIUM             Index        RIGHT ATRIUM           Index LA diam:        5.20 cm 2.34 cm/m   RA Area:     18.10 cm LA Vol (A2C):   67.5 ml 30.33 ml/m  RA Volume:   51.55 ml  23.17 ml/m LA  Vol (A4C):   77.9 ml 35.01 ml/m LA Biplane Vol: 76.3 ml 34.29 ml/m  AORTIC VALVE AV Area (Vmax): 2.29 cm AV Vmax:        148.00 cm/s AV Peak Grad:   8.8 mmHg LVOT Vmax:      108.00 cm/s LVOT Vmean:     68.000 cm/s LVOT VTI:       0.148 m  AORTA Ao Root diam: 3.10 cm Ao Asc diam:  3.30 cm MITRAL VALVE               TRICUSPID VALVE MV Area (PHT): 5.88 cm    TR Peak grad:   32.9 mmHg MV Decel Time: 129 msec    TR Vmax:        287.00 cm/s MV E velocity: 93.80 cm/s MV A velocity: 52.20 cm/s  SHUNTS MV E/A ratio:  1.80        Systemic VTI:  0.15 m                            Systemic Diam: 2.00 cm Toribio Fuel MD Electronically signed by Toribio Fuel MD Signature Date/Time: 10/09/2023/1:35:18 PM    Final      Medications:     Scheduled Medications:  digoxin   0.125 mg Oral Daily   fentaNYL  (SUBLIMAZE ) injection  50 mcg Intravenous Once   gabapentin   300 mg Oral TID   multivitamin with minerals  1 tablet Oral Daily   sodium chloride  flush  3 mL Intravenous Q12H   spironolactone   12.5 mg Oral Daily    Infusions:  heparin  Stopped (10/10/23 0939)    PRN Medications: acetaminophen  **OR** acetaminophen , bisacodyl , hydrALAZINE , ondansetron  **OR** ondansetron  (ZOFRAN ) IV, polyethylene glycol    Patient Profile   Heidi Sexton is a 63 y.o. female with hx of bilateral breast cancer s/p mastectomy and chemo (adriamycin and cytoxan 96', taxotere  carboplatin  and taxol  15'-16'), s/p bilateral salpingo-oophorectomy. Admitted with PE, found to have acute systolic heart failure on echo.   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  Need to rule out ICM - NYHA IV on admission - Volume difficult to gauge. Will re dose diuretics post cath this morning.  - Continue Spiro 12.5 mg daily - Continue digoxin  0.125  mcg/daily - Plan to start losartan  post cath.  - HIV negative.  lactic acid and TSH WNL. - Plan L/RHC to better assess right-sided pressures and CO.  Need to rule out ICM. - Consider cMRI - Place TED hose - Consult dietary team for healthy eating tips. Informed Consent   Shared Decision Making/Informed Consent The risks [stroke (1 in 1000), death (1 in 1000), kidney failure [usually temporary] (1 in 500), bleeding (1 in 200), allergic reaction [possibly serious] (1 in 200)], benefits (diagnostic support and management of coronary artery disease) and alternatives of a cardiac catheterization were discussed in detail with Heidi Sexton and she is willing to proceed.       PE - CT on admission consistent with RLL subsegmental embolus - Continue heparin  gtt   Valvular heart disease - Severe MR and TR on echo this admission - Follow with diuresis, may need intervention   Chest tightness - HsTrop 29>29> 26> 26 - Suspect demand ischemia in setting of volume overload - Would need to rule out ICM - LDL 54 - No chest pain/tightness on exam   Elevated LFTs - AST/ALT 58/148, down trending - Suspect hepatic congestion - RUQ ABD US  with no acute findings   Hx bilateral breast cancer, s/p mastectomy and BSO - Followed by Dr. Odean - s/p adriamycin and cytoxan 96', taxotere  carboplatin  and taxol  15'-16'  7. IDA - tSat 6, ferritin 66. Would benefit from IV iron. Hold off until possible cMRI complete  Length of Stay: 1  Beckey LITTIE Coe, NP  10/10/2023, 9:46 AM  Advanced Heart Failure Team Pager [phone] (M-F; 7a - 5p)  Please contact [facility] for night-coverage after hours (5p -7a ) and weekends on [email or web address]  Patient seen and examined with the above-signed Advanced Practice Provider and/or Housestaff. I personally reviewed laboratory data, imaging studies and relevant notes. I independently examined the patient and formulated the important aspects of the plan. I have edited the note  to reflect any of my changes or salient points. I have personally discussed the plan with the patient and/or family.  Has diuresed well. Denies CP or SOB. Renal function stable  General:  Sitting up No resp difficulty HEENT: normal Neck: supple. JVP 9-10 Carotids 2+ bilat; no bruits. No lymphadenopathy or thryomegaly appreciated. Cor: PMI nondisplaced. Regular rate &  rhythm. No rubs, gallops or murmurs. Lungs: clear Abdomen: soft, nontender, nondistended. No hepatosplenomegaly. No bruits or masses. Good bowel sounds. Extremities: no cyanosis, clubbing, rash, tr edema Neuro: alert & orientedx3, cranial nerves grossly intact. moves all 4 extremities w/o difficulty. Affect pleasant  She has severe biventricular HF with severe functional MR /TR. Suspect chemo-induced CM. Plan R/L cath today followed by cMRI. Continue to adjust GDMT as tolerated.  Toribio Fuel, MD  10:37 AM

## 2023-10-10 NOTE — Progress Notes (Signed)
 ANTICOAGULATION CONSULT NOTE  Pharmacy Consult for Heparin  Indication: pulmonary embolus  Allergies  Allergen Reactions   Adhesive [Tape] Hives   Aleve [Naproxen] Itching   Advil [Ibuprofen]     Itchy red face   Nsaids     Patient Measurements: Height: 5' 7 (170.2 cm) Weight: 108 kg (238 lb 1.6 oz) (scale B) IBW/kg (Calculated) : 61.6 Heparin  Dosing Weight: 87.9 kg  Vital Signs: Temp: 97.9 F (36.6 C) (08/22 0742) Temp Source: Oral (08/22 0742) BP: 123/71 (08/22 0742) Pulse Rate: 86 (08/22 0742)  Labs: Recent Labs    10/08/23 0020 10/08/23 1021 10/08/23 1915 10/09/23 0437 10/09/23 0801 10/10/23 0249  HGB 10.2*  --   --  10.6*  --  10.7*  HCT 32.2*  --   --  34.0*  --  34.0*  PLT 269  --   --  260  --  306  LABPROT  --   --   --   --  15.4*  --   INR  --   --   --   --  1.2  --   HEPARINUNFRC  --    < > 0.21* 0.42  --  0.17*  CREATININE 0.97  --   --  0.93  --  0.89   < > = values in this interval not displayed.    Estimated Creatinine Clearance: 85.1 mL/min (by C-G formula based on SCr of 0.89 mg/dL).   Medical History: Past Medical History:  Diagnosis Date   Anxiety    h/o panic attacks - relative to migranie headaches -pt denies? anxiety related to Chemotherapy   Breast cancer [facility])    both breasts 4'16- Cancer rt.breast '2015-with chemo last Tx. 4'16-Dr. Livesay follows.   Family history of anesthesia complication    daughter got nauseated also   H/O seasonal allergies    OTC   Heart murmur    during pregnancy only- then resolved    Migraine    maybe 2/yr (11/16/2013)   PONV (postoperative nausea and vomiting)    Wears glasses     Medications:  Medications Prior to Admission  Medication Sig Dispense Refill Last Dose/Taking   Cholecalciferol (VITAMIN D) 2000 UNITS tablet Take 2,000 Units by mouth daily.   10/07/2023 Morning   gabapentin  (NEURONTIN ) 300 MG capsule TAKE 1 CAPSULE(300 MG) BY MOUTH THREE TIMES DAILY 90 capsule 2 10/07/2023  Morning   loratadine (CLARITIN) 10 MG tablet Take 10 mg by mouth daily as needed for allergies.   10/07/2023 Morning   oxyCODONE  (OXY IR/ROXICODONE ) 5 MG immediate release tablet Take 1-2 tablets (5-10 mg total) by mouth every 6 (six) hours as needed for moderate pain, severe pain or breakthrough pain. (Patient not taking: Reported on 10/05/2015) 15 tablet 0 Not Taking   Scheduled:   digoxin   0.125 mg Oral Daily   fentaNYL  (SUBLIMAZE ) injection  50 mcg Intravenous Once   gabapentin   300 mg Oral TID   multivitamin with minerals  1 tablet Oral Daily   sodium chloride  flush  3 mL Intravenous Q12H   spironolactone   12.5 mg Oral Daily   Infusions:   heparin  1,400 Units/hr (10/09/23 1154)   PRN: acetaminophen  **OR** acetaminophen , bisacodyl , hydrALAZINE , ondansetron  **OR** ondansetron  (ZOFRAN ) IV, polyethylene glycol  Assessment: 62 yoF presented with SOB and 2 days of bilat leg swelling. Pharmacy consulted to dose heparin  for PE noted on CT.   Heparin  level subtherapeutic, no overnight issues per RN. Cath planned this morning so will defer bolus.  Goal of Therapy:  Heparin  level 0.3-0.7 units/ml Monitor platelets by anticoagulation protocol: Yes   Plan:  Increase heparin  to 1700 units/h F/U [facility] plans after cath  Ozell Jamaica, PharmD, BCPS, [facility] Clinical Pharmacist [phone] Please check AMION for all [facility] Pharmacy numbers 10/10/2023

## 2023-10-10 NOTE — H&P (View-Only) (Signed)
 Advanced Heart Failure Rounding Note  Cardiologist: None  Chief Complaint: Acute systolic heart failuare Subjective:    Family at bedside. Denies CP/SOB.   Renal function stable. -2L UOP.   Objective:   Weight Range: 108 kg Body mass index is 37.29 kg/m.   Vital Signs:   Temp:  [97.9 F (36.6 C)-98.7 F (37.1 C)] 97.9 F (36.6 C) (08/22 0742) Pulse Rate:  [86-103] 86 (08/22 0742) Resp:  [16-20] 16 (08/22 0742) BP: (123-145)/(71-94) 123/71 (08/22 0742) SpO2:  [93 %-100 %] 93 % (08/22 0742) Weight:  [108 kg] 108 kg (08/22 0451)    Weight change: Filed Weights   10/08/23 0152 10/09/23 0500 10/10/23 0451  Weight: 113.4 kg 113.6 kg 108 kg    Intake/Output:   Intake/Output Summary (Last 24 hours) at 10/10/2023 0946 Last data filed at 10/10/2023 0839 Gross per 24 hour  Intake 836.13 ml  Output 2600 ml  Net -1763.87 ml      Physical Exam    General:  well appearing.  No respiratory difficulty Neck: JVD ~9 cm.  Cor: Regular rate & rhythm. 2/6 MR murmur. Lungs: clear Extremities: trace BLE edema  Neuro: alert & oriented x 3. Affect pleasant.   Telemetry   NSR 80s (Personally reviewed)    Labs    CBC Recent Labs    10/08/23 0020 10/09/23 0437 10/10/23 0249  WBC 6.8 7.8 7.9  NEUTROABS 4.1  --   --   HGB 10.2* 10.6* 10.7*  HCT 32.2* 34.0* 34.0*  MCV 87.0 86.5 85.9  PLT 269 260 306   Basic Metabolic Panel Recent Labs    91/79/74 1714 10/09/23 0437 10/10/23 0249  NA  --  140 137  K  --  3.6 3.7  CL  --  101 100  CO2  --  25 25  GLUCOSE  --  99 110*  BUN  --  10 15  CREATININE  --  0.93 0.89  CALCIUM  --  8.6* 9.2  MG 1.8  --   --    Liver Function Tests Recent Labs    10/09/23 0437 10/10/23 0249  AST 58* 34  ALT 148* 108*  ALKPHOS 96 93  BILITOT 2.4* 1.2  PROT 6.3* 6.6  ALBUMIN 3.2* 3.2*   No results for input(s): LIPASE, AMYLASE in the last 72 hours. Cardiac Enzymes No results for input(s): CKTOTAL, CKMB,  CKMBINDEX, TROPONINI in the last 72 hours.  BNP: BNP (last 3 results) No results for input(s): BNP in the last 8760 hours.  ProBNP (last 3 results) Recent Labs    10/08/23 0020  PROBNP 4,583.0*     D-Dimer No results for input(s): DDIMER in the last 72 hours. Hemoglobin A1C No results for input(s): HGBA1C in the last 72 hours. Fasting Lipid Panel Recent Labs    10/09/23 1650  CHOL 112  HDL 41  LDLCALC 54  TRIG 84  CHOLHDL 2.7   Thyroid Function Tests Recent Labs    10/09/23 1650  TSH 0.879    Other results:   Imaging    VAS US  LOWER EXTREMITY VENOUS (DVT) Result Date: 10/09/2023  Lower Venous DVT Study Patient Name:  Heidi Sexton [facility]  Date of Exam:   10/09/2023 Medical Rec #: [id number]           Accession #:    [id number] Date of Birth: 63 years          Patient Gender: F Patient Age:   63 years Exam Location:  [facility] Procedure:      VAS US  LOWER EXTREMITY VENOUS (DVT) Referring Phys: EKTA PATEL --------------------------------------------------------------------------------  Indications: Edema.  Limitations: Body habitus. Performing Technologist: Elmarie Lindau, RVT  Examination Guidelines: A complete evaluation includes B-mode imaging, spectral Doppler, color Doppler, and power Doppler as needed of all accessible portions of each vessel. Bilateral testing is considered an integral part of a complete examination. Limited examinations for reoccurring indications may be performed as noted. The reflux portion of the exam is performed with the patient in reverse Trendelenburg.  +---------+---------------+---------+-----------+----------+--------------+ RIGHT    CompressibilityPhasicitySpontaneityPropertiesThrombus Aging +---------+---------------+---------+-----------+----------+--------------+ CFV      Full           Yes      Yes                                 +---------+---------------+---------+-----------+----------+--------------+  SFJ      Full                                                        +---------+---------------+---------+-----------+----------+--------------+ FV Prox  Full                                                        +---------+---------------+---------+-----------+----------+--------------+ FV Mid   Full                                                        +---------+---------------+---------+-----------+----------+--------------+ FV DistalFull                                                        +---------+---------------+---------+-----------+----------+--------------+ PFV      Full                                                        +---------+---------------+---------+-----------+----------+--------------+ POP      Full           Yes      Yes                                 +---------+---------------+---------+-----------+----------+--------------+ PTV      Full                                                        +---------+---------------+---------+-----------+----------+--------------+ PERO     Full                                                        +---------+---------------+---------+-----------+----------+--------------+   +---------+---------------+---------+-----------+----------+--------------+  LEFT     CompressibilityPhasicitySpontaneityPropertiesThrombus Aging +---------+---------------+---------+-----------+----------+--------------+ CFV      Full           Yes      Yes                                 +---------+---------------+---------+-----------+----------+--------------+ SFJ      Full                                                        +---------+---------------+---------+-----------+----------+--------------+ FV Prox  Full                                                        +---------+---------------+---------+-----------+----------+--------------+ FV Mid   Full                                                         +---------+---------------+---------+-----------+----------+--------------+ FV DistalFull                                                        +---------+---------------+---------+-----------+----------+--------------+ PFV      Full                                                        +---------+---------------+---------+-----------+----------+--------------+ POP      Full           Yes      Yes                                 +---------+---------------+---------+-----------+----------+--------------+ PTV      Full                                                        +---------+---------------+---------+-----------+----------+--------------+ PERO     Full                                                        +---------+---------------+---------+-----------+----------+--------------+     Summary: RIGHT: - There is no evidence of deep vein thrombosis in the lower extremity.  - No cystic structure found in the popliteal fossa.  LEFT: - There is no evidence of deep vein thrombosis in the lower extremity.  - No  cystic structure found in the popliteal fossa.  *See table(s) above for measurements and observations. Electronically signed by Debby Robertson on 10/09/2023 at 9:15:31 PM.    Final    ECHOCARDIOGRAM COMPLETE Result Date: 10/09/2023    ECHOCARDIOGRAM REPORT   Patient Name:   Heidi Sexton Date of Exam: 10/09/2023 Medical Rec #:  [id number]          Height:       67.0 in Accession #:    [id number]         Weight:       250.5 lb Date of Birth:  11-Jul-1960         BSA:          2.225 m Patient Age:    62 years           BP:           109/70 mmHg Patient Gender: F                  HR:           105 bpm. Exam Location:  Inpatient Procedure: 2D Echo, Cardiac Doppler, Color Doppler and Intracardiac            Opacification Agent (Both Spectral and Color Flow Doppler were            utilized during procedure). Indications:    Pulmonary Embolus  I26.09, CHF I50.9  History:        Patient has no prior history of Echocardiogram examinations.                 Signs/Symptoms:Shortness of Breath.  Sonographer:    Thea Norlander RCS Referring Phys: TOBIE MODEST, V IMPRESSIONS  1. Left ventricular ejection fraction, by estimation, is 20 to 25%. The left ventricle has severely decreased function. The left ventricle demonstrates global hypokinesis. The left ventricular internal cavity size was moderately dilated. Left ventricular diastolic parameters are indeterminate.  2. Right ventricular systolic function is severely reduced. The right ventricular size is normal. There is moderately elevated pulmonary artery systolic pressure. The estimated right ventricular systolic pressure is 47.9 mmHg.  3. Left atrial size was moderately dilated.  4. Right atrial size was moderately dilated.  5. There is no evidence of cardiac tamponade.  6. The mitral valve is normal in structure. Severe mitral valve regurgitation. No evidence of mitral stenosis.  7. Tricuspid valve regurgitation is severe.  8. The aortic valve is normal in structure. Aortic valve regurgitation is not visualized. No aortic stenosis is present.  9. The inferior vena cava is dilated in size with <50% respiratory variability, suggesting right atrial pressure of 15 mmHg. FINDINGS  Left Ventricle: Left ventricular ejection fraction, by estimation, is 20 to 25%. The left ventricle has severely decreased function. The left ventricle demonstrates global hypokinesis. Definity  contrast agent was given IV to delineate the left ventricular endocardial borders. The left ventricular internal cavity size was moderately dilated. There is no left ventricular hypertrophy. Left ventricular diastolic parameters are indeterminate. Right Ventricle: The right ventricular size is normal. No increase in right ventricular wall thickness. Right ventricular systolic function is severely reduced. There is moderately elevated pulmonary  artery systolic pressure. The tricuspid regurgitant velocity is 2.87 m/s, and with an assumed right atrial pressure of 15 mmHg, the estimated right ventricular systolic pressure is 47.9 mmHg. Left Atrium: Left atrial size was moderately dilated. Right Atrium: Right atrial size was moderately dilated. Pericardium: Trivial pericardial effusion is present. The  pericardial effusion is posterior to the left ventricle. There is no evidence of cardiac tamponade. Mitral Valve: The mitral valve is normal in structure. Severe mitral valve regurgitation, with centrally-directed jet. No evidence of mitral valve stenosis. Tricuspid Valve: The tricuspid valve is normal in structure. Tricuspid valve regurgitation is severe. No evidence of tricuspid stenosis. Aortic Valve: The aortic valve is normal in structure. Aortic valve regurgitation is not visualized. No aortic stenosis is present. Aortic valve peak gradient measures 8.8 mmHg. Pulmonic Valve: The pulmonic valve was normal in structure. Pulmonic valve regurgitation is trivial. No evidence of pulmonic stenosis. Aorta: The aortic root is normal in size and structure. Venous: The inferior vena cava is dilated in size with less than 50% respiratory variability, suggesting right atrial pressure of 15 mmHg. IAS/Shunts: No atrial level shunt detected by color flow Doppler.  LEFT VENTRICLE PLAX 2D LVIDd:         6.40 cm   Diastology LVIDs:         5.30 cm   LV e' medial:    6.74 cm/s LV PW:         1.10 cm   LV E/e' medial:  13.9 LV IVS:        1.00 cm   LV e' lateral:   15.10 cm/s LVOT diam:     2.00 cm   LV E/e' lateral: 6.2 LV SV:         46 LV SV Index:   21 LVOT Area:     3.14 cm  RIGHT VENTRICLE             IVC RV S prime:     13.90 cm/s  IVC diam: 3.10 cm TAPSE (M-mode): 2.0 cm LEFT ATRIUM             Index        RIGHT ATRIUM           Index LA diam:        5.20 cm 2.34 cm/m   RA Area:     18.10 cm LA Vol (A2C):   67.5 ml 30.33 ml/m  RA Volume:   51.55 ml  23.17 ml/m LA  Vol (A4C):   77.9 ml 35.01 ml/m LA Biplane Vol: 76.3 ml 34.29 ml/m  AORTIC VALVE AV Area (Vmax): 2.29 cm AV Vmax:        148.00 cm/s AV Peak Grad:   8.8 mmHg LVOT Vmax:      108.00 cm/s LVOT Vmean:     68.000 cm/s LVOT VTI:       0.148 m  AORTA Ao Root diam: 3.10 cm Ao Asc diam:  3.30 cm MITRAL VALVE               TRICUSPID VALVE MV Area (PHT): 5.88 cm    TR Peak grad:   32.9 mmHg MV Decel Time: 129 msec    TR Vmax:        287.00 cm/s MV E velocity: 93.80 cm/s MV A velocity: 52.20 cm/s  SHUNTS MV E/A ratio:  1.80        Systemic VTI:  0.15 m                            Systemic Diam: 2.00 cm Toribio Fuel MD Electronically signed by Toribio Fuel MD Signature Date/Time: 10/09/2023/1:35:18 PM    Final      Medications:     Scheduled Medications:  digoxin   0.125 mg Oral Daily   fentaNYL  (SUBLIMAZE ) injection  50 mcg Intravenous Once   gabapentin   300 mg Oral TID   multivitamin with minerals  1 tablet Oral Daily   sodium chloride  flush  3 mL Intravenous Q12H   spironolactone   12.5 mg Oral Daily    Infusions:  heparin  Stopped (10/10/23 0939)    PRN Medications: acetaminophen  **OR** acetaminophen , bisacodyl , hydrALAZINE , ondansetron  **OR** ondansetron  (ZOFRAN ) IV, polyethylene glycol    Patient Profile   Heidi Sexton is a 63 y.o. female with hx of bilateral breast cancer s/p mastectomy and chemo (adriamycin and cytoxan 96', taxotere  carboplatin  and taxol  15'-16'), s/p bilateral salpingo-oophorectomy. Admitted with PE, found to have acute systolic heart failure on echo.   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  Need to rule out ICM - NYHA IV on admission - Volume difficult to gauge. Will re dose diuretics post cath this morning.  - Continue Spiro 12.5 mg daily - Continue digoxin  0.125  mcg/daily - Plan to start losartan  post cath.  - HIV negative.  lactic acid and TSH WNL. - Plan L/RHC to better assess right-sided pressures and CO.  Need to rule out ICM. - Consider cMRI - Place TED hose - Consult dietary team for healthy eating tips. Informed Consent   Shared Decision Making/Informed Consent The risks [stroke (1 in 1000), death (1 in 1000), kidney failure [usually temporary] (1 in 500), bleeding (1 in 200), allergic reaction [possibly serious] (1 in 200)], benefits (diagnostic support and management of coronary artery disease) and alternatives of a cardiac catheterization were discussed in detail with Ms. Janowicz and she is willing to proceed.       PE - CT on admission consistent with RLL subsegmental embolus - Continue heparin  gtt   Valvular heart disease - Severe MR and TR on echo this admission - Follow with diuresis, may need intervention   Chest tightness - HsTrop 29>29> 26> 26 - Suspect demand ischemia in setting of volume overload - Would need to rule out ICM - LDL 54 - No chest pain/tightness on exam   Elevated LFTs - AST/ALT 58/148, down trending - Suspect hepatic congestion - RUQ ABD US  with no acute findings   Hx bilateral breast cancer, s/p mastectomy and BSO - Followed by Dr. Odean - s/p adriamycin and cytoxan 96', taxotere  carboplatin  and taxol  15'-16'  7. IDA - tSat 6, ferritin 66. Would benefit from IV iron. Hold off until possible cMRI complete  Length of Stay: 1  Beckey LITTIE Coe, NP  10/10/2023, 9:46 AM  Advanced Heart Failure Team Pager [phone] (M-F; 7a - 5p)  Please contact [facility] for night-coverage after hours (5p -7a ) and weekends on [email or web address]  Patient seen and examined with the above-signed Advanced Practice Provider and/or Housestaff. I personally reviewed laboratory data, imaging studies and relevant notes. I independently examined the patient and formulated the important aspects of the plan. I have edited the note  to reflect any of my changes or salient points. I have personally discussed the plan with the patient and/or family.  Has diuresed well. Denies CP or SOB. Renal function stable  General:  Sitting up No resp difficulty HEENT: normal Neck: supple. JVP 9-10 Carotids 2+ bilat; no bruits. No lymphadenopathy or thryomegaly appreciated. Cor: PMI nondisplaced. Regular rate &  rhythm. No rubs, gallops or murmurs. Lungs: clear Abdomen: soft, nontender, nondistended. No hepatosplenomegaly. No bruits or masses. Good bowel sounds. Extremities: no cyanosis, clubbing, rash, tr edema Neuro: alert & orientedx3, cranial nerves grossly intact. moves all 4 extremities w/o difficulty. Affect pleasant  She has severe biventricular HF with severe functional MR /TR. Suspect chemo-induced CM. Plan R/L cath today followed by cMRI. Continue to adjust GDMT as tolerated.  Toribio Fuel, MD  10:37 AM

## 2023-10-10 NOTE — Plan of Care (Signed)
  Problem: Cardiovascular: Goal: Vascular access site(s) Level 0-1 will be maintained Outcome: Progressing   Problem: Health Behavior/Discharge Planning: Goal: Ability to safely manage health-related needs after discharge will improve Outcome: Progressing   Problem: Education: Goal: Understanding of CV disease, CV risk reduction, and recovery process will improve Outcome: Progressing   Problem: Health Behavior/Discharge Planning: Goal: Ability to safely manage health-related needs after discharge will improve Outcome: Progressing

## 2023-10-10 NOTE — Interval H&P Note (Signed)
 History and Physical Interval Note:  10/10/2023 9:46 AM  Heidi Sexton  has presented today for surgery, with the diagnosis of heart failure.  The various methods of treatment have been discussed with the patient and family. After consideration of risks, benefits and other options for treatment, the patient has consented to  Procedure(s): RIGHT/LEFT HEART CATH AND CORONARY ANGIOGRAPHY (N/A) as a surgical intervention.  The patient's history has been reviewed, patient examined, no change in status, stable for surgery.  I have reviewed the patient's chart and labs.  Questions were answered to the patient's satisfaction.     [name]

## 2023-10-10 NOTE — Plan of Care (Signed)

## 2023-10-10 NOTE — Progress Notes (Signed)
 Progress Note   Patient: Heidi Sexton FMW:[id number] DOB: Jun 12, 1960 DOA: 10/07/2023  DOS: the patient was seen and examined on 10/10/2023   Brief hospital course:  63 y.o. female with bilateral breast CA, s/p mastectomy, radiation therapy, chemo, BSO, neuropathy.  Presented with worsening lower extremity edema, chest tightness, elevated dimer.  Subsequently diagnosed with PE and HFrEF.  Assessment and Plan:  Acute pulmonary embolism - Currently on heparin  drip.  No hypoxia.  Likely transition to Eliquis  after procedure.  Acute HFrEF - EF on echo noted 20-25%.  LV and RV severely reduced.  Followed closely by cardiology and advanced heart failure.  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  Cardiac cath today to rule out ischemic cardiomyopathy.  Continues on GDMT per [facility] (spironolactone , digoxin , losartan  post cath).  Severe MR/TR - Noted on echo.  Following with cardiology as above.  Unclear if inpatient intervention warranted at this time.  Transaminitis - Likely secondary to congestion from HFrEF.  Downtrending.  Will recheck LFTs in AM.  Iron deficiency anemia - Noted low iron stores with elevated TIBC suggesting of chronic iron deficiency.  Will supplement with IV iron after possible cardiac MRI.  Subjective: Patient resting, this morning.  Denies worsening shortness of breath, chest pain, nausea, vomiting, abdominal pain.  Not have any supplemental oxygen.  Ready for procedure later today.  Physical Exam:  Vitals:   10/09/23 1924 10/09/23 2240 10/10/23 0451 10/10/23 0742  BP: (!) 145/85 137/81 (!) 138/94 123/71  Pulse: 95 (!) 103 99 86  Resp: 19 20 19 16   Temp: 98 F (36.7 C) 98.2 F (36.8 C) 98.7 F (37.1 C) 97.9 F (36.6 C)  TempSrc: Oral Oral Oral Oral  SpO2: 97% 95% 96% 93%  Weight:   108 kg   Height:        GENERAL:  Alert, pleasant, no acute distress  HEENT:  EOMI CARDIOVASCULAR:  RRR, murmur appreciated RESPIRATORY:  Clear to auscultation, no  wheezing, rales, or rhonchi GASTROINTESTINAL:  Soft, nontender, nondistended EXTREMITIES:  No LE edema bilaterally NEURO:  No new focal deficits appreciated SKIN:  No rashes noted PSYCH:  Appropriate mood and affect     Data Reviewed:  Imaging Studies: VAS US  LOWER EXTREMITY VENOUS (DVT) Result Date: 10/09/2023  Lower Venous DVT Study Patient Name:  Heidi Sexton [facility]  Date of Exam:   10/09/2023 Medical Rec #: [id number]           Accession #:    [id number] Date of Birth: [birth year]          Patient Gender: F Patient Age:   75 years Exam Location:  [facility] Procedure:      VAS US  LOWER EXTREMITY VENOUS (DVT) Referring Phys: EKTA PATEL --------------------------------------------------------------------------------  Indications: Edema.  Limitations: Body habitus. Performing Technologist: Elmarie Lindau, RVT  Examination Guidelines: A complete evaluation includes B-mode imaging, spectral Doppler, color Doppler, and power Doppler as needed of all accessible portions of each vessel. Bilateral testing is considered an integral part of a complete examination. Limited examinations for reoccurring indications may be performed as noted. The reflux portion of the exam is performed with the patient in reverse Trendelenburg.  +---------+---------------+---------+-----------+----------+--------------+ RIGHT    CompressibilityPhasicitySpontaneityPropertiesThrombus Aging +---------+---------------+---------+-----------+----------+--------------+ CFV      Full           Yes      Yes                                 +---------+---------------+---------+-----------+----------+--------------+  SFJ      Full                                                        +---------+---------------+---------+-----------+----------+--------------+ FV Prox  Full                                                        +---------+---------------+---------+-----------+----------+--------------+ FV Mid    Full                                                        +---------+---------------+---------+-----------+----------+--------------+ FV DistalFull                                                        +---------+---------------+---------+-----------+----------+--------------+ PFV      Full                                                        +---------+---------------+---------+-----------+----------+--------------+ POP      Full           Yes      Yes                                 +---------+---------------+---------+-----------+----------+--------------+ PTV      Full                                                        +---------+---------------+---------+-----------+----------+--------------+ PERO     Full                                                        +---------+---------------+---------+-----------+----------+--------------+   +---------+---------------+---------+-----------+----------+--------------+ LEFT     CompressibilityPhasicitySpontaneityPropertiesThrombus Aging +---------+---------------+---------+-----------+----------+--------------+ CFV      Full           Yes      Yes                                 +---------+---------------+---------+-----------+----------+--------------+ SFJ      Full                                                        +---------+---------------+---------+-----------+----------+--------------+  FV Prox  Full                                                        +---------+---------------+---------+-----------+----------+--------------+ FV Mid   Full                                                        +---------+---------------+---------+-----------+----------+--------------+ FV DistalFull                                                        +---------+---------------+---------+-----------+----------+--------------+ PFV      Full                                                         +---------+---------------+---------+-----------+----------+--------------+ POP      Full           Yes      Yes                                 +---------+---------------+---------+-----------+----------+--------------+ PTV      Full                                                        +---------+---------------+---------+-----------+----------+--------------+ PERO     Full                                                        +---------+---------------+---------+-----------+----------+--------------+     Summary: RIGHT: - There is no evidence of deep vein thrombosis in the lower extremity.  - No cystic structure found in the popliteal fossa.  LEFT: - There is no evidence of deep vein thrombosis in the lower extremity.  - No cystic structure found in the popliteal fossa.  *See table(s) above for measurements and observations. Electronically signed by Debby Robertson on 10/09/2023 at 9:15:31 PM.    Final    ECHOCARDIOGRAM COMPLETE Result Date: 10/09/2023    ECHOCARDIOGRAM REPORT   Patient Name:   Heidi Sexton Date of Exam: 10/09/2023 Medical Rec #:  [id number]          Height:       67.0 in Accession #:    [id number]         Weight:       250.5 lb Date of Birth:  06-10-1960         BSA:          2.225 m Patient Age:  62 years           BP:           109/70 mmHg Patient Gender: F                  HR:           105 bpm. Exam Location:  Inpatient Procedure: 2D Echo, Cardiac Doppler, Color Doppler and Intracardiac            Opacification Agent (Both Spectral and Color Flow Doppler were            utilized during procedure). Indications:    Pulmonary Embolus I26.09, CHF I50.9  History:        Patient has no prior history of Echocardiogram examinations.                 Signs/Symptoms:Shortness of Breath.  Sonographer:    Thea Norlander RCS Referring Phys: TOBIE MODEST, V IMPRESSIONS  1. Left ventricular ejection fraction, by estimation, is 20 to 25%. The left ventricle  has severely decreased function. The left ventricle demonstrates global hypokinesis. The left ventricular internal cavity size was moderately dilated. Left ventricular diastolic parameters are indeterminate.  2. Right ventricular systolic function is severely reduced. The right ventricular size is normal. There is moderately elevated pulmonary artery systolic pressure. The estimated right ventricular systolic pressure is 47.9 mmHg.  3. Left atrial size was moderately dilated.  4. Right atrial size was moderately dilated.  5. There is no evidence of cardiac tamponade.  6. The mitral valve is normal in structure. Severe mitral valve regurgitation. No evidence of mitral stenosis.  7. Tricuspid valve regurgitation is severe.  8. The aortic valve is normal in structure. Aortic valve regurgitation is not visualized. No aortic stenosis is present.  9. The inferior vena cava is dilated in size with <50% respiratory variability, suggesting right atrial pressure of 15 mmHg. FINDINGS  Left Ventricle: Left ventricular ejection fraction, by estimation, is 20 to 25%. The left ventricle has severely decreased function. The left ventricle demonstrates global hypokinesis. Definity  contrast agent was given IV to delineate the left ventricular endocardial borders. The left ventricular internal cavity size was moderately dilated. There is no left ventricular hypertrophy. Left ventricular diastolic parameters are indeterminate. Right Ventricle: The right ventricular size is normal. No increase in right ventricular wall thickness. Right ventricular systolic function is severely reduced. There is moderately elevated pulmonary artery systolic pressure. The tricuspid regurgitant velocity is 2.87 m/s, and with an assumed right atrial pressure of 15 mmHg, the estimated right ventricular systolic pressure is 47.9 mmHg. Left Atrium: Left atrial size was moderately dilated. Right Atrium: Right atrial size was moderately dilated. Pericardium:  Trivial pericardial effusion is present. The pericardial effusion is posterior to the left ventricle. There is no evidence of cardiac tamponade. Mitral Valve: The mitral valve is normal in structure. Severe mitral valve regurgitation, with centrally-directed jet. No evidence of mitral valve stenosis. Tricuspid Valve: The tricuspid valve is normal in structure. Tricuspid valve regurgitation is severe. No evidence of tricuspid stenosis. Aortic Valve: The aortic valve is normal in structure. Aortic valve regurgitation is not visualized. No aortic stenosis is present. Aortic valve peak gradient measures 8.8 mmHg. Pulmonic Valve: The pulmonic valve was normal in structure. Pulmonic valve regurgitation is trivial. No evidence of pulmonic stenosis. Aorta: The aortic root is normal in size and structure. Venous: The inferior vena cava is dilated in size with less than 50% respiratory variability, suggesting right atrial pressure  of 15 mmHg. IAS/Shunts: No atrial level shunt detected by color flow Doppler.  LEFT VENTRICLE PLAX 2D LVIDd:         6.40 cm   Diastology LVIDs:         5.30 cm   LV e' medial:    6.74 cm/s LV PW:         1.10 cm   LV E/e' medial:  13.9 LV IVS:        1.00 cm   LV e' lateral:   15.10 cm/s LVOT diam:     2.00 cm   LV E/e' lateral: 6.2 LV SV:         46 LV SV Index:   21 LVOT Area:     3.14 cm  RIGHT VENTRICLE             IVC RV S prime:     13.90 cm/s  IVC diam: 3.10 cm TAPSE (M-mode): 2.0 cm LEFT ATRIUM             Index        RIGHT ATRIUM           Index LA diam:        5.20 cm 2.34 cm/m   RA Area:     18.10 cm LA Vol (A2C):   67.5 ml 30.33 ml/m  RA Volume:   51.55 ml  23.17 ml/m LA Vol (A4C):   77.9 ml 35.01 ml/m LA Biplane Vol: 76.3 ml 34.29 ml/m  AORTIC VALVE AV Area (Vmax): 2.29 cm AV Vmax:        148.00 cm/s AV Peak Grad:   8.8 mmHg LVOT Vmax:      108.00 cm/s LVOT Vmean:     68.000 cm/s LVOT VTI:       0.148 m  AORTA Ao Root diam: 3.10 cm Ao Asc diam:  3.30 cm MITRAL VALVE                TRICUSPID VALVE MV Area (PHT): 5.88 cm    TR Peak grad:   32.9 mmHg MV Decel Time: 129 msec    TR Vmax:        287.00 cm/s MV E velocity: 93.80 cm/s MV A velocity: 52.20 cm/s  SHUNTS MV E/A ratio:  1.80        Systemic VTI:  0.15 m                            Systemic Diam: 2.00 cm Toribio Fuel MD Electronically signed by Toribio Fuel MD Signature Date/Time: 10/09/2023/1:35:18 PM    Final    US  Abdomen Limited RUQ (LIVER/GB) Result Date: 10/09/2023 CLINICAL DATA:  Transaminitis EXAM: ULTRASOUND ABDOMEN LIMITED RIGHT UPPER QUADRANT COMPARISON:  CT 10/12/2013 FINDINGS: Gallbladder: No gallstones or wall thickening visualized. No sonographic Murphy sign noted by sonographer. Common bile duct: Diameter: 2.6 mm, normal. Liver: No focal lesion identified. Within normal limits in parenchymal echogenicity. Portal vein is patent on color Doppler imaging with normal direction of blood flow towards the liver. Other: None. IMPRESSION: Normal right upper quadrant ultrasound. Electronically Signed   By: Oneil Officer M.D.   On: 10/09/2023 10:00   CT Angio Chest PE W and/or Wo Contrast Result Date: 10/08/2023 CLINICAL DATA:  Shortness of breath and chest pain EXAM: CT ANGIOGRAPHY CHEST WITH CONTRAST TECHNIQUE: Multidetector CT imaging of the chest was performed using the standard protocol during bolus administration of intravenous contrast. Multiplanar  CT image reconstructions and MIPs were obtained to evaluate the vascular anatomy. RADIATION DOSE REDUCTION: This exam was performed according to the departmental dose-optimization program which includes automated exposure control, adjustment of the mA and/or kV according to patient size and/or use of iterative reconstruction technique. CONTRAST:  75mL OMNIPAQUE  IOHEXOL  350 MG/ML SOLN COMPARISON:  Chest x-ray 10/07/2023 FINDINGS: Cardiovascular: Slightly degraded by motion. Positive for small acute right lower lobe subsegmental embolus, coronal series 9 image 46 and  sagittal series 10 image 65. No other discrete filling defects are visualized. Nonaneurysmal aorta. Cardiomegaly. No sizable pericardial effusion Mediastinum/Nodes: Patent trachea. No suspicious thyroid mass. Subcentimeter mediastinal lymph nodes. Esophagus within normal limits. Lungs/Pleura: No sizable pleural effusion. Wedge shaped and somewhat bandlike density in the left upper lobe with associated minimal bronchiectasis, favored to represent focal scarring. Mild smooth septal thickening most evident at the bases, could be due to mild edema. Minimal subpleural scarring in the left upper lobe. Upper Abdomen: Reflux of contrast into the hepatic veins consistent with elevated right heart pressure. No acute finding. Musculoskeletal: Right breast implant. No acute osseous abnormality. Review of the MIP images confirms the above findings. IMPRESSION: 1. Slightly degraded by motion. Findings suspicious for small acute right lower lobe subsegmental embolus. 2. Cardiomegaly with reflux of contrast into the hepatic veins consistent with elevated right heart pressure. Mild smooth septal thickening most evident at the bases, could be due to mild edema. 3. Wedge shaped and somewhat bandlike density in the left upper lobe with associated minimal bronchiectasis, favored to represent focal scarring. Critical Value/emergent results were called by telephone at the time of interpretation on 10/08/2023 at 1:40 am to provider VICENTA ABLE , who verbally acknowledged these results. Electronically Signed   By: Luke Bun M.D.   On: 10/08/2023 01:40    There are no new results to review at this time.  Previous records (including but not limited to H&P, progress notes, nursing notes, TOC management) were reviewed in assessment of this patient.  Labs: CBC: Recent Labs  Lab 10/08/23 0020 10/09/23 0437 10/10/23 0249  WBC 6.8 7.8 7.9  NEUTROABS 4.1  --   --   HGB 10.2* 10.6* 10.7*  HCT 32.2* 34.0* 34.0*  MCV 87.0 86.5  85.9  PLT 269 260 306   Basic Metabolic Panel: Recent Labs  Lab 10/08/23 0020 10/08/23 1714 10/09/23 0437 10/10/23 0249  NA 138  --  140 137  K 3.9  --  3.6 3.7  CL 106  --  101 100  CO2 20*  --  25 25  GLUCOSE 115*  --  99 110*  BUN 17  --  10 15  CREATININE 0.97  --  0.93 0.89  CALCIUM 8.8*  --  8.6* 9.2  MG  --  1.8  --   --    Liver Function Tests: Recent Labs  Lab 10/08/23 1714 10/09/23 0437 10/10/23 0249  AST 81* 58* 34  ALT 173* 148* 108*  ALKPHOS 107 96 93  BILITOT 2.1* 2.4* 1.2  PROT 6.5 6.3* 6.6  ALBUMIN 3.3* 3.2* 3.2*   CBG: No results for input(s): GLUCAP in the last 168 hours.  Scheduled Meds:  digoxin   0.125 mg Oral Daily   fentaNYL  (SUBLIMAZE ) injection  50 mcg Intravenous Once   gabapentin   300 mg Oral TID   multivitamin with minerals  1 tablet Oral Daily   sodium chloride  flush  3 mL Intravenous Q12H   spironolactone   12.5 mg Oral Daily   Continuous  Infusions:  heparin  Stopped (10/10/23 0939)   PRN Meds:.acetaminophen  **OR** acetaminophen , bisacodyl , hydrALAZINE , ondansetron  **OR** ondansetron  (ZOFRAN ) IV, polyethylene glycol  Family Communication: None at bedside  Disposition: Status is: Inpatient The patient will require care spanning > 2 midnights and should be moved to inpatient because: New CHF and PE     Time spent: 51 minutes  Length of inpatient stay: 1 days  Author: Carliss LELON Canales, DO 10/10/2023 10:17 AM  For on call review [email or web address].

## 2023-10-10 NOTE — Hospital Course (Addendum)
 63 y.o. female with bilateral breast CA, s/p mastectomy, radiation therapy, chemo, BSO, neuropathy.  Presented with worsening lower extremity edema, chest tightness, elevated dimer.  Subsequently diagnosed with PE and HFrEF.   Assessment and Plan:   Acute pulmonary embolism - Currently on heparin  drip.  No hypoxia.  Likely transition to Eliquis  after procedure.   Acute HFrEF - EF on echo noted 20-25%.  LV and RV severely reduced.  Followed closely by cardiology and advanced heart failure.  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  Cardiac cath 8/22 showing non-ischemic cardiomyopathy.  Continues on GDMT per [facility] (spironolactone , digoxin , losartan  post cath).  Cardiac MRI complete   Severe MR/TR - Noted on echo.  Following with cardiology as above.     Transaminitis - Likely secondary to congestion from HFrEF.  Continues downtrending.  Will recheck LFTs in AM.   Iron  deficiency anemia - Noted low iron  stores with elevated TIBC suggesting of chronic iron  deficiency.  Will supplement with IV iron  after possible cardiac MRI.

## 2023-10-10 NOTE — Telephone Encounter (Signed)
 Patient Product/process development scientist completed.    The patient is insured through [facility]. Patient has [employer], may use a copay card, and/or apply for patient assistance if available.    Ran test claim for Jardiance 10 mg and the current 30 day co-pay is $35.00.  Ran test claim for Farxgia 10 mg and Product Not Covered  Ran test claim for Entresto 24-26 mg (Brand and Generic) and Product Not Covered  This test claim was processed through [employer]- copay amounts may vary at other pharmacies due to [employer], or as the patient moves through the different stages of their insurance plan.     Reyes Sharps, CPHT Pharmacy Technician III Certified Patient Advocate [facility] Patient Advocate Team Direct Number: [phone]  Fax: [phone]

## 2023-10-10 NOTE — Interval H&P Note (Signed)
 History and Physical Interval Note:  10/10/2023 10:38 AM  Heidi Sexton  has presented today for surgery, with the diagnosis of heart failure.  The various methods of treatment have been discussed with the patient and family. After consideration of risks, benefits and other options for treatment, the patient has consented to  Procedure(s): RIGHT/LEFT HEART CATH AND CORONARY ANGIOGRAPHY (N/A) as a surgical intervention.  The patient's history has been reviewed, patient examined, no change in status, stable for surgery.  I have reviewed the patient's chart and labs.  Questions were answered to the patient's satisfaction.     Callaway Hailes

## 2023-10-11 ENCOUNTER — Encounter (HOSPITAL_COMMUNITY): Payer: Self-pay | Admitting: Internal Medicine

## 2023-10-11 DIAGNOSIS — I2699 Other pulmonary embolism without acute cor pulmonale: Secondary | ICD-10-CM | POA: Diagnosis not present

## 2023-10-11 DIAGNOSIS — D509 Iron deficiency anemia, unspecified: Secondary | ICD-10-CM | POA: Diagnosis not present

## 2023-10-11 DIAGNOSIS — I5020 Unspecified systolic (congestive) heart failure: Secondary | ICD-10-CM | POA: Diagnosis not present

## 2023-10-11 DIAGNOSIS — I5021 Acute systolic (congestive) heart failure: Principal | ICD-10-CM

## 2023-10-11 LAB — COMPREHENSIVE METABOLIC PANEL WITH GFR
ALT: 79 U/L — ABNORMAL HIGH (ref 0–44)
AST: 25 U/L (ref 15–41)
Albumin: 3.1 g/dL — ABNORMAL LOW (ref 3.5–5.0)
Alkaline Phosphatase: 90 U/L (ref 38–126)
Anion gap: 11 (ref 5–15)
BUN: 17 mg/dL (ref 6–20)
CO2: 26 mmol/L (ref 22–32)
Calcium: 9.1 mg/dL (ref 8.9–10.3)
Chloride: 100 mmol/L (ref 98–111)
Creatinine, Ser: 0.94 mg/dL (ref 0.44–1.00)
GFR, Estimated: >60 mL/min (ref >60)
Glucose, Bld: 109 mg/dL — ABNORMAL HIGH (ref 70–99)
Potassium: 3.9 mmol/L (ref 3.5–5.1)
Sodium: 137 mmol/L (ref 135–145)
Total Bilirubin: 1.1 mg/dL (ref 0.0–1.2)
Total Protein: 5.5 g/dL — ABNORMAL LOW (ref 6.5–8.1)

## 2023-10-11 LAB — CBC
HCT: 34.5 % — ABNORMAL LOW (ref 36.0–46.0)
Hemoglobin: 11.1 g/dL — ABNORMAL LOW (ref 12.0–15.0)
MCH: 27.5 pg (ref 26.0–34.0)
MCHC: 32.2 g/dL (ref 30.0–36.0)
MCV: 85.4 fL (ref 80.0–100.0)
Platelets: 314 K/uL (ref 150–400)
RBC: 4.04 MIL/uL (ref 3.87–5.11)
RDW: 15.6 % — ABNORMAL HIGH (ref 11.5–15.5)
WBC: 6.5 K/uL (ref 4.0–10.5)
nRBC: 0 % (ref 0.0–0.2)

## 2023-10-11 LAB — MAGNESIUM: Magnesium: 2 mg/dL (ref 1.7–2.4)

## 2023-10-11 MED ORDER — IRON SUCROSE 200 MG IVPB - SIMPLE MED
200.0000 mg | Freq: Once | Status: AC
  Administered 2023-10-11: 200 mg via INTRAVENOUS
  Filled 2023-10-11: qty 110

## 2023-10-11 MED ORDER — LOSARTAN POTASSIUM 25 MG PO TABS
25.0000 mg | ORAL_TABLET | Freq: Every day | ORAL | Status: DC
Start: 2023-10-11 — End: 2023-10-12
  Administered 2023-10-11 – 2023-10-12 (×2): 25 mg via ORAL
  Filled 2023-10-11 (×2): qty 1

## 2023-10-11 MED ORDER — EMPAGLIFLOZIN 10 MG PO TABS
10.0000 mg | ORAL_TABLET | Freq: Every day | ORAL | Status: DC
  Administered 2023-10-11 – 2023-10-12 (×2): 10 mg via ORAL
  Filled 2023-10-11 (×2): qty 1

## 2023-10-11 MED ORDER — CYCLOBENZAPRINE HCL 10 MG PO TABS
5.0000 mg | ORAL_TABLET | Freq: Three times a day (TID) | ORAL | Status: DC | PRN
  Administered 2023-10-11: 5 mg via ORAL
  Filled 2023-10-11: qty 1

## 2023-10-11 MED ORDER — BISACODYL 5 MG PO TBEC
10.0000 mg | DELAYED_RELEASE_TABLET | Freq: Once | ORAL | Status: DC
  Filled 2023-10-11: qty 2

## 2023-10-11 MED ORDER — SACUBITRIL-VALSARTAN 24-26 MG PO TABS: 1.0000 | ORAL_TABLET | Freq: Two times a day (BID) | ORAL | Status: DC

## 2023-10-11 MED ORDER — FUROSEMIDE 10 MG/ML IJ SOLN
80.0000 mg | Freq: Once | INTRAMUSCULAR | Status: AC
  Administered 2023-10-11: 80 mg via INTRAVENOUS
  Filled 2023-10-11: qty 8

## 2023-10-11 NOTE — Progress Notes (Signed)
 Advanced Heart Failure Rounding Note  Cardiologist: None  Chief Complaint: Acute systolic heart failuare Subjective:    Family at bedside.   Denies CP or SOB.   Cath yesterday with minimal CAD. Elevated filling pressures. Margianl CO  cMRI done. Results pending  Objective:   Weight Range: 106.6 kg Body mass index is 36.81 kg/m.   Vital Signs:   Temp:  [98.2 F (36.8 C)-98.9 F (37.2 C)] 98.4 F (36.9 C) (08/23 1141) Pulse Rate:  [87-103] 100 (08/23 1141) Resp:  [13-20] 19 (08/23 1141) BP: (105-151)/(66-111) 114/79 (08/23 1141) SpO2:  [93 %-99 %] 99 % (08/23 1141) Weight:  [106.6 kg-108 kg] 106.6 kg (08/23 0531)    Weight change: Filed Weights   10/10/23 0451 10/10/23 1300 10/11/23 0531  Weight: 108 kg 108 kg 106.6 kg    Intake/Output:   Intake/Output Summary (Last 24 hours) at 10/11/2023 1225 Last data filed at 10/11/2023 1150 Gross per 24 hour  Intake 110 ml  Output 2350 ml  Net -2240 ml      Physical Exam    General:Sitting up in bedNo resp difficulty HEENT: normal Neck: supple. JVP 10 Cor: PMI nondisplaced. Regular rate & rhythm. 2/6 TR Lungs: clear Abdomen: obese soft, nontender, nondistended. No hepatosplenomegaly. No bruits or masses. Good bowel sounds. Extremities: no cyanosis, clubbing, rash, tr -1+ edema Neuro: alert & orientedx3, cranial nerves grossly intact. moves all 4 extremities w/o difficulty. Affect pleasant  Telemetry   NSR 90s-100 (Personally reviewed)    Labs    CBC Recent Labs    10/10/23 0249 10/10/23 1051 10/10/23 1056 10/11/23 0215  WBC 7.9  --   --  6.5  HGB 10.7*   < > 11.2*  10.5* 11.1*  HCT 34.0*   < > 33.0*  31.0* 34.5*  MCV 85.9  --   --  85.4  PLT 306  --   --  314   < > = values in this interval not displayed.   Basic Metabolic Panel Recent Labs    91/79/74 1714 10/09/23 0437 10/10/23 0249 10/10/23 1051 10/10/23 1056 10/11/23 0215  NA  --    < > 137   < > 138  134* 137  K  --    < > 3.7   <  > 3.6  3.2* 3.9  CL  --    < > 100  --   --  100  CO2  --    < > 25  --   --  26  GLUCOSE  --    < > 110*  --   --  109*  BUN  --    < > 15  --   --  17  CREATININE  --    < > 0.89  --   --  0.94  CALCIUM   --    < > 9.2  --   --  9.1  MG 1.8  --   --   --   --  2.0   < > = values in this interval not displayed.   Liver Function Tests Recent Labs    10/10/23 0249 10/11/23 0215  AST 34 25  ALT 108* 79*  ALKPHOS 93 90  BILITOT 1.2 1.1  PROT 6.6 5.5*  ALBUMIN 3.2* 3.1*   No results for input(s): LIPASE, AMYLASE in the last 72 hours. Cardiac Enzymes No results for input(s): CKTOTAL, CKMB, CKMBINDEX, TROPONINI in the last 72 hours.  BNP: BNP (last 3 results)  No results for input(s): BNP in the last 8760 hours.  ProBNP (last 3 results) Recent Labs    10/08/23 0020  PROBNP 4,583.0*     D-Dimer No results for input(s): DDIMER in the last 72 hours. Hemoglobin A1C No results for input(s): HGBA1C in the last 72 hours. Fasting Lipid Panel Recent Labs    10/09/23 1650  CHOL 112  HDL 41  LDLCALC 54  TRIG 84  CHOLHDL 2.7   Thyroid Function Tests Recent Labs    10/09/23 1650  TSH 0.879    Other results:   Imaging    No results found.    Medications:     Scheduled Medications:  apixaban   10 mg Oral BID   Followed by   NOREEN ON 10/17/2023] apixaban   5 mg Oral BID   bisacodyl   10 mg Oral Once   digoxin   0.125 mg Oral Daily   fentaNYL  (SUBLIMAZE ) injection  50 mcg Intravenous Once   gabapentin   300 mg Oral TID   losartan   12.5 mg Oral Daily   multivitamin with minerals  1 tablet Oral Daily   sodium chloride  flush  3 mL Intravenous Q12H   sodium chloride  flush  3 mL Intravenous Q12H   sodium chloride  flush  3 mL Intravenous Q12H   spironolactone   12.5 mg Oral Daily    Infusions:  sodium chloride       PRN Medications: sodium chloride , acetaminophen  **OR** acetaminophen , bisacodyl , hydrALAZINE , ondansetron  **OR** ondansetron   (ZOFRAN ) IV, polyethylene glycol, sodium chloride  flush, sodium chloride  flush    Patient Profile   Heidi Sexton is a 63 y.o. female with hx of bilateral breast cancer s/p mastectomy and chemo (adriamycin and cytoxan 96', taxotere  carboplatin  and taxol  15'-16'), s/p bilateral salpingo-oophorectomy. Admitted with PE, found to have acute systolic heart failure on echo.   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  Need to rule out ICM - NYHA IV on admission - Cath 8/22 Minimal non-obs CAD (20%RCA) - Stil mildly volume overlaoded. 1 dose IV lasix  today - Continue Spiro 12.5 mg daily - Continue digoxin  0.125 mcg/daily - Switch losartan  to entresto  24/26 - Add Jardiance    PE - CT on admission consistent with RLL subsegmental embolus - Switch to Eliquis    Valvular heart disease - Severe MR and TR on echo this admission - Follow with diuresis, may need intervention   Chest tightness - HsTrop 29>29> 26> 26 - Cath no sig CAD   Elevated LFTs - AST/ALT 58/148, down trending - Suspect hepatic congestion - RUQ ABD US  with no acute findings   Hx bilateral breast cancer, s/p mastectomy and BSO - Followed by Dr. Odean - s/p adriamycin and cytoxan 96', taxotere  carboplatin  and taxol  15'-16'  7. IDA - tSat 6, ferritin 66. Would benefit from IV iron . Will order   Length of Stay: 2  Toribio Fuel, MD  10/11/2023, 12:25 PM  Advanced Heart Failure Team Pager [phone] (M-F; 7a - 5p)  Please contact [facility] for night-coverage after hours (5p -7a ) and weekends on [email or web address]

## 2023-10-11 NOTE — Plan of Care (Signed)
  Problem: Education: Goal: Knowledge of General Education information will improve Description: Including pain rating scale, medication(s)/side effects and non-pharmacologic comfort measures Outcome: Progressing   Problem: Clinical Measurements: Goal: Ability to maintain clinical measurements within normal limits will improve Outcome: Progressing Goal: Will remain free from infection Outcome: Progressing   Problem: Activity: Goal: Risk for activity intolerance will decrease Outcome: Progressing   Problem: Pain Managment: Goal: General experience of comfort will improve and/or be controlled Outcome: Progressing   Problem: Safety: Goal: Ability to remain free from injury will improve Outcome: Progressing

## 2023-10-11 NOTE — Plan of Care (Signed)
   Problem: Education: Goal: Knowledge of General Education information will improve Description Including pain rating scale, medication(s)/side effects and non-pharmacologic comfort measures Outcome: Progressing   Problem: Health Behavior/Discharge Planning: Goal: Ability to manage health-related needs will improve Outcome: Progressing

## 2023-10-11 NOTE — Progress Notes (Signed)
 Progress Note   Patient: Heidi Sexton FMW:[id number] DOB: 1960-11-29 DOA: 10/07/2023  DOS: the patient was seen and examined on 10/11/2023   Brief hospital course:  63 y.o. female with bilateral breast CA, s/p mastectomy, radiation therapy, chemo, BSO, neuropathy.  Presented with worsening lower extremity edema, chest tightness, elevated dimer.  Subsequently diagnosed with PE and HFrEF.   Assessment and Plan:   Acute pulmonary embolism - Currently on heparin  drip.  No hypoxia.  Likely transition to Eliquis  after procedure.   Acute HFrEF - EF on echo noted 20-25%.  LV and RV severely reduced.  Followed closely by cardiology and advanced heart failure.  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  Cardiac cath 8/22 showing non-ischemic cardiomyopathy.  Continues on GDMT per [facility] (spironolactone , digoxin , losartan  post cath).  Cardiac MRI complete   Severe MR/TR - Noted on echo.  Following with cardiology as above.     Transaminitis - Likely secondary to congestion from HFrEF.  Continues downtrending.  Will recheck LFTs in AM.   Iron  deficiency anemia - Noted low iron  stores with elevated TIBC suggesting of chronic iron  deficiency.  Will supplement with IV iron  after possible cardiac MRI.   Subjective: Patient feeling well this morning.  Denying any fever, chills, chest pain, nausea, vomiting abdominal pain.  Did cardiac MRI this morning.  Motivated to go home if possible.  Physical Exam:  Vitals:   10/10/23 1952 10/10/23 2300 10/11/23 0531 10/11/23 0805  BP: 108/66 123/74 136/78 121/74  Pulse: 100 (!) 103 89 91  Resp: 16 20 16 16   Temp: 98.2 F (36.8 C) 98.9 F (37.2 C) 98.4 F (36.9 C) 98.3 F (36.8 C)  TempSrc: Oral  Oral Oral  SpO2: 97% 93% 94% 97%  Weight:   106.6 kg   Height:        GENERAL:  Alert, pleasant, no acute distress  HEENT:  EOMI CARDIOVASCULAR:  RRR, murmur appreciated RESPIRATORY:  Clear to auscultation, no wheezing, rales, or  rhonchi GASTROINTESTINAL:  Soft, nontender, nondistended EXTREMITIES:  No LE edema bilaterally NEURO:  No new focal deficits appreciated SKIN:  No rashes noted PSYCH:  Appropriate mood and affect    Data Reviewed:  Imaging Studies: CARDIAC CATHETERIZATION Result Date: 10/10/2023   Prox RCA to Mid RCA lesion is 20% stenosed.   The left ventricular ejection fraction is 25-35% by visual estimate. Findings: Ao = 104/72 (87) LV = 117/24 RA =  9 RV = 39/12 PA = 38/27 (30) PCW = 18 Fick cardiac output/index = 7.0/3.2 Thermo CO/CI = 4.3/1.9 PVR = 1.7 (Fick) 2.8 (TD) Ao sat = 94% PA sat = 65% PAPi = 1.2 Assessment: 1. Minimal non-obstructive CAD 2. Severe NICM EF 20-25% 3. Elevated volume status with low cardiac output Plan/Discussion: Continue diuresis and titration of GDMT. cMRI today Toribio Fuel, MD 12:36 PM  VAS US  LOWER EXTREMITY VENOUS (DVT) Result Date: 10/09/2023  Lower Venous DVT Study Patient Name:  Heidi Sexton [facility]  Date of Exam:   10/09/2023 Medical Rec #: [id number]           Accession #:    [id number] Date of Birth: 1960-08-05          Patient Gender: F Patient Age:   58 years Exam Location:  [facility] Procedure:      VAS US  LOWER EXTREMITY VENOUS (DVT) Referring Phys: EKTA PATEL --------------------------------------------------------------------------------  Indications: Edema.  Limitations: Body habitus. Performing Technologist: Elmarie Lindau, RVT  Examination Guidelines: A complete evaluation includes B-mode imaging, spectral  Doppler, color Doppler, and power Doppler as needed of all accessible portions of each vessel. Bilateral testing is considered an integral part of a complete examination. Limited examinations for reoccurring indications may be performed as noted. The reflux portion of the exam is performed with the patient in reverse Trendelenburg.  +---------+---------------+---------+-----------+----------+--------------+ RIGHT     CompressibilityPhasicitySpontaneityPropertiesThrombus Aging +---------+---------------+---------+-----------+----------+--------------+ CFV      Full           Yes      Yes                                 +---------+---------------+---------+-----------+----------+--------------+ SFJ      Full                                                        +---------+---------------+---------+-----------+----------+--------------+ FV Prox  Full                                                        +---------+---------------+---------+-----------+----------+--------------+ FV Mid   Full                                                        +---------+---------------+---------+-----------+----------+--------------+ FV DistalFull                                                        +---------+---------------+---------+-----------+----------+--------------+ PFV      Full                                                        +---------+---------------+---------+-----------+----------+--------------+ POP      Full           Yes      Yes                                 +---------+---------------+---------+-----------+----------+--------------+ PTV      Full                                                        +---------+---------------+---------+-----------+----------+--------------+ PERO     Full                                                        +---------+---------------+---------+-----------+----------+--------------+   +---------+---------------+---------+-----------+----------+--------------+  LEFT     CompressibilityPhasicitySpontaneityPropertiesThrombus Aging +---------+---------------+---------+-----------+----------+--------------+ CFV      Full           Yes      Yes                                 +---------+---------------+---------+-----------+----------+--------------+ SFJ      Full                                                         +---------+---------------+---------+-----------+----------+--------------+ FV Prox  Full                                                        +---------+---------------+---------+-----------+----------+--------------+ FV Mid   Full                                                        +---------+---------------+---------+-----------+----------+--------------+ FV DistalFull                                                        +---------+---------------+---------+-----------+----------+--------------+ PFV      Full                                                        +---------+---------------+---------+-----------+----------+--------------+ POP      Full           Yes      Yes                                 +---------+---------------+---------+-----------+----------+--------------+ PTV      Full                                                        +---------+---------------+---------+-----------+----------+--------------+ PERO     Full                                                        +---------+---------------+---------+-----------+----------+--------------+     Summary: RIGHT: - There is no evidence of deep vein thrombosis in the lower extremity.  - No cystic structure found in the popliteal fossa.  LEFT: - There is no evidence of deep vein thrombosis in the lower extremity.  - No  cystic structure found in the popliteal fossa.  *See table(s) above for measurements and observations. Electronically signed by Debby Robertson on 10/09/2023 at 9:15:31 PM.    Final    ECHOCARDIOGRAM COMPLETE Result Date: 10/09/2023    ECHOCARDIOGRAM REPORT   Patient Name:   BRITTAIN SMITHEY Date of Exam: 10/09/2023 Medical Rec #:  [id number]          Height:       67.0 in Accession #:    [id number]         Weight:       250.5 lb Date of Birth:  07-31-1960         BSA:          2.225 m Patient Age:    62 years           BP:           109/70 mmHg Patient  Gender: F                  HR:           105 bpm. Exam Location:  Inpatient Procedure: 2D Echo, Cardiac Doppler, Color Doppler and Intracardiac            Opacification Agent (Both Spectral and Color Flow Doppler were            utilized during procedure). Indications:    Pulmonary Embolus I26.09, CHF I50.9  History:        Patient has no prior history of Echocardiogram examinations.                 Signs/Symptoms:Shortness of Breath.  Sonographer:    Thea Norlander RCS Referring Phys: TOBIE MODEST, V IMPRESSIONS  1. Left ventricular ejection fraction, by estimation, is 20 to 25%. The left ventricle has severely decreased function. The left ventricle demonstrates global hypokinesis. The left ventricular internal cavity size was moderately dilated. Left ventricular diastolic parameters are indeterminate.  2. Right ventricular systolic function is severely reduced. The right ventricular size is normal. There is moderately elevated pulmonary artery systolic pressure. The estimated right ventricular systolic pressure is 47.9 mmHg.  3. Left atrial size was moderately dilated.  4. Right atrial size was moderately dilated.  5. There is no evidence of cardiac tamponade.  6. The mitral valve is normal in structure. Severe mitral valve regurgitation. No evidence of mitral stenosis.  7. Tricuspid valve regurgitation is severe.  8. The aortic valve is normal in structure. Aortic valve regurgitation is not visualized. No aortic stenosis is present.  9. The inferior vena cava is dilated in size with <50% respiratory variability, suggesting right atrial pressure of 15 mmHg. FINDINGS  Left Ventricle: Left ventricular ejection fraction, by estimation, is 20 to 25%. The left ventricle has severely decreased function. The left ventricle demonstrates global hypokinesis. Definity  contrast agent was given IV to delineate the left ventricular endocardial borders. The left ventricular internal cavity size was moderately dilated. There is  no left ventricular hypertrophy. Left ventricular diastolic parameters are indeterminate. Right Ventricle: The right ventricular size is normal. No increase in right ventricular wall thickness. Right ventricular systolic function is severely reduced. There is moderately elevated pulmonary artery systolic pressure. The tricuspid regurgitant velocity is 2.87 m/s, and with an assumed right atrial pressure of 15 mmHg, the estimated right ventricular systolic pressure is 47.9 mmHg. Left Atrium: Left atrial size was moderately dilated. Right Atrium: Right atrial size was moderately dilated. Pericardium: Trivial pericardial effusion is present. The  pericardial effusion is posterior to the left ventricle. There is no evidence of cardiac tamponade. Mitral Valve: The mitral valve is normal in structure. Severe mitral valve regurgitation, with centrally-directed jet. No evidence of mitral valve stenosis. Tricuspid Valve: The tricuspid valve is normal in structure. Tricuspid valve regurgitation is severe. No evidence of tricuspid stenosis. Aortic Valve: The aortic valve is normal in structure. Aortic valve regurgitation is not visualized. No aortic stenosis is present. Aortic valve peak gradient measures 8.8 mmHg. Pulmonic Valve: The pulmonic valve was normal in structure. Pulmonic valve regurgitation is trivial. No evidence of pulmonic stenosis. Aorta: The aortic root is normal in size and structure. Venous: The inferior vena cava is dilated in size with less than 50% respiratory variability, suggesting right atrial pressure of 15 mmHg. IAS/Shunts: No atrial level shunt detected by color flow Doppler.  LEFT VENTRICLE PLAX 2D LVIDd:         6.40 cm   Diastology LVIDs:         5.30 cm   LV e' medial:    6.74 cm/s LV PW:         1.10 cm   LV E/e' medial:  13.9 LV IVS:        1.00 cm   LV e' lateral:   15.10 cm/s LVOT diam:     2.00 cm   LV E/e' lateral: 6.2 LV SV:         46 LV SV Index:   21 LVOT Area:     3.14 cm  RIGHT  VENTRICLE             IVC RV S prime:     13.90 cm/s  IVC diam: 3.10 cm TAPSE (M-mode): 2.0 cm LEFT ATRIUM             Index        RIGHT ATRIUM           Index LA diam:        5.20 cm 2.34 cm/m   RA Area:     18.10 cm LA Vol (A2C):   67.5 ml 30.33 ml/m  RA Volume:   51.55 ml  23.17 ml/m LA Vol (A4C):   77.9 ml 35.01 ml/m LA Biplane Vol: 76.3 ml 34.29 ml/m  AORTIC VALVE AV Area (Vmax): 2.29 cm AV Vmax:        148.00 cm/s AV Peak Grad:   8.8 mmHg LVOT Vmax:      108.00 cm/s LVOT Vmean:     68.000 cm/s LVOT VTI:       0.148 m  AORTA Ao Root diam: 3.10 cm Ao Asc diam:  3.30 cm MITRAL VALVE               TRICUSPID VALVE MV Area (PHT): 5.88 cm    TR Peak grad:   32.9 mmHg MV Decel Time: 129 msec    TR Vmax:        287.00 cm/s MV E velocity: 93.80 cm/s MV A velocity: 52.20 cm/s  SHUNTS MV E/A ratio:  1.80        Systemic VTI:  0.15 m                            Systemic Diam: 2.00 cm Toribio Fuel MD Electronically signed by Toribio Fuel MD Signature Date/Time: 10/09/2023/1:35:18 PM    Final    US  Abdomen Limited RUQ (LIVER/GB) Result Date: 10/09/2023 CLINICAL DATA:  Transaminitis  EXAM: ULTRASOUND ABDOMEN LIMITED RIGHT UPPER QUADRANT COMPARISON:  CT 10/12/2013 FINDINGS: Gallbladder: No gallstones or wall thickening visualized. No sonographic Murphy sign noted by sonographer. Common bile duct: Diameter: 2.6 mm, normal. Liver: No focal lesion identified. Within normal limits in parenchymal echogenicity. Portal vein is patent on color Doppler imaging with normal direction of blood flow towards the liver. Other: None. IMPRESSION: Normal right upper quadrant ultrasound. Electronically Signed   By: Oneil Officer M.D.   On: 10/09/2023 10:00   CT Angio Chest PE W and/or Wo Contrast Result Date: 10/08/2023 CLINICAL DATA:  Shortness of breath and chest pain EXAM: CT ANGIOGRAPHY CHEST WITH CONTRAST TECHNIQUE: Multidetector CT imaging of the chest was performed using the standard protocol during bolus administration  of intravenous contrast. Multiplanar CT image reconstructions and MIPs were obtained to evaluate the vascular anatomy. RADIATION DOSE REDUCTION: This exam was performed according to the departmental dose-optimization program which includes automated exposure control, adjustment of the mA and/or kV according to patient size and/or use of iterative reconstruction technique. CONTRAST:  75mL OMNIPAQUE  IOHEXOL  350 MG/ML SOLN COMPARISON:  Chest x-ray 10/07/2023 FINDINGS: Cardiovascular: Slightly degraded by motion. Positive for small acute right lower lobe subsegmental embolus, coronal series 9 image 46 and sagittal series 10 image 65. No other discrete filling defects are visualized. Nonaneurysmal aorta. Cardiomegaly. No sizable pericardial effusion Mediastinum/Nodes: Patent trachea. No suspicious thyroid mass. Subcentimeter mediastinal lymph nodes. Esophagus within normal limits. Lungs/Pleura: No sizable pleural effusion. Wedge shaped and somewhat bandlike density in the left upper lobe with associated minimal bronchiectasis, favored to represent focal scarring. Mild smooth septal thickening most evident at the bases, could be due to mild edema. Minimal subpleural scarring in the left upper lobe. Upper Abdomen: Reflux of contrast into the hepatic veins consistent with elevated right heart pressure. No acute finding. Musculoskeletal: Right breast implant. No acute osseous abnormality. Review of the MIP images confirms the above findings. IMPRESSION: 1. Slightly degraded by motion. Findings suspicious for small acute right lower lobe subsegmental embolus. 2. Cardiomegaly with reflux of contrast into the hepatic veins consistent with elevated right heart pressure. Mild smooth septal thickening most evident at the bases, could be due to mild edema. 3. Wedge shaped and somewhat bandlike density in the left upper lobe with associated minimal bronchiectasis, favored to represent focal scarring. Critical Value/emergent results  were called by telephone at the time of interpretation on 10/08/2023 at 1:40 am to provider VICENTA ABLE , who verbally acknowledged these results. Electronically Signed   By: Luke Bun M.D.   On: 10/08/2023 01:40    Results are pending, will review when available.  Previous records (including but not limited to H&P, progress notes, nursing notes, TOC management) were reviewed in assessment of this patient.  Labs: CBC: Recent Labs  Lab 10/08/23 0020 10/09/23 0437 10/10/23 0249 10/10/23 1051 10/10/23 1056 10/11/23 0215  WBC 6.8 7.8 7.9  --   --  6.5  NEUTROABS 4.1  --   --   --   --   --   HGB 10.2* 10.6* 10.7* 8.2* 11.2*  10.5* 11.1*  HCT 32.2* 34.0* 34.0* 24.0* 33.0*  31.0* 34.5*  MCV 87.0 86.5 85.9  --   --  85.4  PLT 269 260 306  --   --  314   Basic Metabolic Panel: Recent Labs  Lab 10/08/23 0020 10/08/23 1714 10/09/23 0437 10/10/23 0249 10/10/23 1051 10/10/23 1056 10/11/23 0215  NA 138  --  140 137 148* 138  134* 137  K 3.9  --  3.6 3.7 2.6* 3.6  3.2* 3.9  CL 106  --  101 100  --   --  100  CO2 20*  --  25 25  --   --  26  GLUCOSE 115*  --  99 110*  --   --  109*  BUN 17  --  10 15  --   --  17  CREATININE 0.97  --  0.93 0.89  --   --  0.94  CALCIUM  8.8*  --  8.6* 9.2  --   --  9.1  MG  --  1.8  --   --   --   --  2.0   Liver Function Tests: Recent Labs  Lab 10/08/23 1714 10/09/23 0437 10/10/23 0249 10/11/23 0215  AST 81* 58* 34 25  ALT 173* 148* 108* 79*  ALKPHOS 107 96 93 90  BILITOT 2.1* 2.4* 1.2 1.1  PROT 6.5 6.3* 6.6 5.5*  ALBUMIN 3.3* 3.2* 3.2* 3.1*   CBG: No results for input(s): GLUCAP in the last 168 hours.  Scheduled Meds:  apixaban   10 mg Oral BID   Followed by   NOREEN ON 10/17/2023] apixaban   5 mg Oral BID   bisacodyl   10 mg Oral Once   digoxin   0.125 mg Oral Daily   fentaNYL  (SUBLIMAZE ) injection  50 mcg Intravenous Once   gabapentin   300 mg Oral TID   losartan   12.5 mg Oral Daily   multivitamin with minerals  1 tablet  Oral Daily   sodium chloride  flush  3 mL Intravenous Q12H   sodium chloride  flush  3 mL Intravenous Q12H   sodium chloride  flush  3 mL Intravenous Q12H   spironolactone   12.5 mg Oral Daily   Continuous Infusions:  sodium chloride      sodium chloride      PRN Meds:.sodium chloride , sodium chloride , acetaminophen  **OR** acetaminophen , bisacodyl , hydrALAZINE , ondansetron  **OR** ondansetron  (ZOFRAN ) IV, polyethylene glycol, sodium chloride  flush, sodium chloride  flush  Family Communication: None at bedside  Disposition: Status is: Inpatient Remains inpatient appropriate because: HFrEF     Time spent: 39 minutes  Length of inpatient stay: 2 days  Author: Carliss LELON Canales, DO 10/11/2023 11:28 AM  For on call review [email or web address].

## 2023-10-12 DIAGNOSIS — D509 Iron deficiency anemia, unspecified: Secondary | ICD-10-CM | POA: Diagnosis not present

## 2023-10-12 DIAGNOSIS — I2699 Other pulmonary embolism without acute cor pulmonale: Secondary | ICD-10-CM | POA: Diagnosis not present

## 2023-10-12 DIAGNOSIS — I5020 Unspecified systolic (congestive) heart failure: Secondary | ICD-10-CM | POA: Diagnosis not present

## 2023-10-12 DIAGNOSIS — I5021 Acute systolic (congestive) heart failure: Secondary | ICD-10-CM | POA: Diagnosis not present

## 2023-10-12 LAB — BASIC METABOLIC PANEL WITH GFR
Anion gap: 12 (ref 5–15)
BUN: 21 mg/dL — ABNORMAL HIGH (ref 6–20)
CO2: 25 mmol/L (ref 22–32)
Calcium: 9.4 mg/dL (ref 8.9–10.3)
Chloride: 97 mmol/L — ABNORMAL LOW (ref 98–111)
Creatinine, Ser: 1.15 mg/dL — ABNORMAL HIGH (ref 0.44–1.00)
GFR, Estimated: 55 mL/min — ABNORMAL LOW (ref >60)
Glucose, Bld: 102 mg/dL — ABNORMAL HIGH (ref 70–99)
Potassium: 3.9 mmol/L (ref 3.5–5.1)
Sodium: 134 mmol/L — ABNORMAL LOW (ref 135–145)

## 2023-10-12 LAB — MAGNESIUM: Magnesium: 2.2 mg/dL (ref 1.7–2.4)

## 2023-10-12 LAB — CBC
HCT: 39 % (ref 36.0–46.0)
Hemoglobin: 12.1 g/dL (ref 12.0–15.0)
MCH: 26.9 pg (ref 26.0–34.0)
MCHC: 31 g/dL (ref 30.0–36.0)
MCV: 86.7 fL (ref 80.0–100.0)
Platelets: 318 K/uL (ref 150–400)
RBC: 4.5 MIL/uL (ref 3.87–5.11)
RDW: 15.7 % — ABNORMAL HIGH (ref 11.5–15.5)
WBC: 5.7 K/uL (ref 4.0–10.5)
nRBC: 0 % (ref 0.0–0.2)

## 2023-10-12 MED ORDER — FUROSEMIDE 40 MG PO TABS: 40.0000 mg | ORAL_TABLET | Freq: Every day | ORAL | 0 refills | Status: AC | PRN

## 2023-10-12 MED ORDER — ATORVASTATIN CALCIUM 40 MG PO TABS: 40.0000 mg | ORAL_TABLET | Freq: Every day | ORAL | 0 refills | Status: DC

## 2023-10-12 MED ORDER — EMPAGLIFLOZIN 10 MG PO TABS: 10.0000 mg | ORAL_TABLET | Freq: Every day | ORAL | 0 refills | Status: DC

## 2023-10-12 MED ORDER — FERROUS SULFATE 325 (65 FE) MG PO TBEC: 325.0000 mg | DELAYED_RELEASE_TABLET | Freq: Every day | ORAL | 0 refills | Status: DC

## 2023-10-12 MED ORDER — ATORVASTATIN CALCIUM 40 MG PO TABS: 40.0000 mg | ORAL_TABLET | Freq: Every day | ORAL | Status: DC

## 2023-10-12 MED ORDER — DIGOXIN 125 MCG PO TABS: 0.1250 mg | ORAL_TABLET | Freq: Every day | ORAL | 0 refills | Status: DC

## 2023-10-12 MED ORDER — FERROUS SULFATE 325 (65 FE) MG PO TBEC
325.0000 mg | DELAYED_RELEASE_TABLET | Freq: Every day | ORAL | Status: DC
  Filled 2023-10-12: qty 1

## 2023-10-12 MED ORDER — SPIRONOLACTONE 25 MG PO TABS: 25.0000 mg | ORAL_TABLET | Freq: Every day | ORAL | Status: DC

## 2023-10-12 MED ORDER — LOSARTAN POTASSIUM 25 MG PO TABS: 25.0000 mg | ORAL_TABLET | Freq: Every day | ORAL | 0 refills | Status: DC

## 2023-10-12 MED ORDER — APIXABAN (ELIQUIS) VTE STARTER PACK (10MG AND 5MG): ORAL_TABLET | ORAL | 0 refills | Status: DC

## 2023-10-12 MED ORDER — SPIRONOLACTONE 25 MG PO TABS
25.0000 mg | ORAL_TABLET | Freq: Every day | ORAL | 0 refills | Status: DC
Start: 2023-10-13 — End: 2023-11-11

## 2023-10-12 NOTE — Discharge Summary (Signed)
 Physician Discharge Summary   Patient: Heidi Sexton MRN: [id number] DOB: July 30, 1960  Admit date:     10/07/2023  Discharge date: 10/12/23  Discharge Physician: Heidi Sexton   PCP: Heidi Other, MD   Recommendations at discharge:    Pt to be discharged home.   If you experience worsening fever, chills, chest pain, shortness of breath, or Sexton concerning symptoms, please call your PCP or go to the emergency department immediately.  Discharge Diagnoses: Principal Problem:   Pulmonary embolism (HCC) Active Problems:   HFrEF (heart failure with reduced ejection fraction) (HCC)   Severe mitral regurgitation   Severe tricuspid regurgitation   Iron  deficiency anemia   Acute systolic heart failure (HCC)  Resolved Problems:   * No resolved hospital problems. *   Hospital Course:  63 y.o. female with bilateral breast CA, s/p mastectomy, radiation therapy, chemo, BSO, neuropathy.  Presented with worsening lower extremity edema, chest tightness, elevated dimer.  Subsequently diagnosed with PE and HFrEF.   Assessment and Plan:   Acute pulmonary embolism - Transitioned from heparin  drip to eliquis  therapy.  Pt to be given prescription for eliquis  starter pack to take as directed:  10mg  BID x 7 days, then 5mg  BID thereafter.   Acute HFrEF - EF on echo noted 20-25%.  LV and RV severely reduced.  Followed closely by cardiology and advanced heart failure.  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  Cardiac cath 8/22 showing non-ischemic cardiomyopathy.  Continues on GDMT per [facility] (spironolactone , digoxin , losartan , Jardiance ).  Cardiac MRI completed.  Prescriptions for 3/4 GDMT provided. Follow-up with cardiology in 1 week.  Referral for CHF clinic provided as well.    Severe MR/TR - Noted on echo.  Following with cardiology as above.     Transaminitis - Likely secondary to congestion from HFrEF.  Continues downtrending.     Iron  deficiency anemia - Noted low iron  stores with  elevated TIBC suggesting of chronic iron  deficiency.  S/p IV.  Will transition to p.o. iron  upon discharge.   Consultants: cardiology Procedures performed: LHC  Disposition: Home Diet recommendation:  Cardiac diet  DISCHARGE MEDICATION: Allergies as of 10/12/2023       Reactions   Adhesive [tape] Hives   Aleve [naproxen] Itching   Advil [ibuprofen]    Itchy red face   Nsaids         Medication List     STOP taking these medications    oxyCODONE  5 MG immediate release tablet Commonly known as: Oxy IR/ROXICODONE        TAKE these medications    Apixaban  Starter Pack (10mg  and 5mg ) Commonly known as: ELIQUIS  STARTER PACK Take as directed on package: start with two-5mg  tablets twice daily for 7 days. On day 8, switch to one-5mg  tablet twice daily.   atorvastatin  40 MG tablet Commonly known as: LIPITOR Take 1 tablet (40 mg total) by mouth daily.   digoxin  0.125 MG tablet Commonly known as: LANOXIN  Take 1 tablet (0.125 mg total) by mouth daily. Start taking on: October 13, 2023   empagliflozin  10 MG Tabs tablet Commonly known as: JARDIANCE  Take 1 tablet (10 mg total) by mouth daily. Start taking on: October 13, 2023   ferrous sulfate  325 (65 FE) MG EC tablet Take 1 tablet (325 mg total) by mouth daily with breakfast. Start taking on: October 13, 2023   furosemide  40 MG tablet Commonly known as: Lasix  Take 1 tablet (40 mg total) by mouth daily as needed for fluid or edema.  gabapentin  300 MG capsule Commonly known as: NEURONTIN  TAKE 1 CAPSULE(300 MG) BY MOUTH THREE TIMES DAILY   loratadine 10 MG tablet Commonly known as: CLARITIN Take 10 mg by mouth daily as needed for allergies.   losartan  25 MG tablet Commonly known as: COZAAR  Take 1 tablet (25 mg total) by mouth daily. Start taking on: October 13, 2023   spironolactone  25 MG tablet Commonly known as: ALDACTONE  Take 1 tablet (25 mg total) by mouth daily. Start taking on: October 13, 2023   Vitamin D  50 MCG (2000 UT) tablet Take 2,000 Units by mouth daily.        Follow-up Information     [facility] Follow up on 10/21/2023.   Specialty: Cardiology Why: [place] up in the [facility] 9/2 at 930am Entrance C, free valet Contact information: [address] [place]  [phone]                Discharge Exam: [name]   10/10/23 1300 10/11/23 0531 10/12/23 0500  Weight: 108 kg 106.6 kg 108.2 kg    GENERAL:  Alert, pleasant, no acute distress  HEENT:  EOMI CARDIOVASCULAR:  RRR, murmur appreciated RESPIRATORY:  Clear to auscultation, no wheezing, rales, or rhonchi GASTROINTESTINAL:  Soft, nontender, nondistended EXTREMITIES:  No LE edema bilaterally NEURO:  No new focal deficits appreciated SKIN:  No rashes noted PSYCH:  Appropriate mood and affect    Condition at discharge: improving  The results of significant diagnostics from this hospitalization (including imaging, microbiology, ancillary and laboratory) are listed below for reference.   Imaging Studies: MR CARDIAC MORPHOLOGY W WO CONTRAST Result Date: 10/11/2023 CLINICAL DATA:  Clinical question of heart failure Study assumes HCT of 34 and BSA of 2.26 m2. EXAM: CARDIAC MRI TECHNIQUE: The patient was scanned on a 1.5 Tesla GE magnet. A dedicated cardiac coil was used. Functional imaging was done using Fiesta sequences. 2,3, and 4 chamber views were done to assess for RWMA's. Modified Simpson's rule using was used to calculate an ejection fraction on a dedicated work Research officer, [employer]. The patient received 10 cc of Gadavist . After 10 minutes inversion recovery sequences were used to assess for infiltration and scar tissue. Flow quantification was performed 2 times during this examination with flow quantification performed at the levels of the ascending aorta above the valve, pulmonary artery above the valve.  CONTRAST:  10 cc  of Gadavist  FINDINGS: 1. Severely dilated left ventricular size, with LVEDD 67 mm, and LVEDVi 131 mL/m2. Normal left ventricular thickness. Severe decrease in left ventricular systolic function (LVEF =28%). There are no regional wall motion abnormalities, but global hypokinesis. Left ventricular parametric mapping notable for normal T2. ECV elevation in the basal inferior septum, 42%. There is no late gadolinium enhancement in the left ventricular myocardium. Normal resting perfusion. 2. Dilated right ventricular size with RVEDVI 109 mL/m2. Normal right ventricular thickness. Moderately decreased right ventricular systolic function (RVEF =32%). Relative septal hypokinesis. 3.  Bi-atrial dilation.  IVC dilation, 2.2 cm. 4. Normal size of the aortic root, ascending aorta and pulmonary artery. 5. Valve assessment: Aortic Valve: Tri-leaflet aortic valve. Qualitatively, there is no significant regurgitation. Regurgitant fraction <1 %. Mean gradient 1.6 Mm Hg. Pulmonic Valve: Mild regurgitation.  Regurgitant fraction 10 %. Tricuspid Valve: Morphology not well visualized. Moderate regurgitation. Regurgitant fraction 28 %. Mitral Valve: Ventricular functional moderate regurgitation. Regurgitant fraction 21 %. 6. Normal pericardium. Small circumferential pericardial effusion without  tamponade physiology. 7. Grossly, no extracardiac findings. Recommended dedicated study if concerned for non-cardiac pathology. 8. Breathhold artifacts noted. This decreased the sensitivity of volumetric and flow assessments. IMPRESSION: 1. Dilated left ventricle with severe decrease in function, LVEF 28%. 2. Dilated right ventricle with moderately decreased function, RVEF 32%. 3.  Biatrial dilation. 4.  Moderate mitral and tricuspid regurgitation. Stanly Leavens MD Reference Ranges for Cardiac MRI (Adult Males) Left Ventricle (LV): LVESVi (mL/m) * ?74: ?30 * 75-86: 31-38 * 87-98: 39-44 * ?99: ?45 LV Mass Index (g/m) *  ?74: 49-115 * 75-86: - * 87-98: - * ?99: - LVEF (%) * ?74: ?58 * 75-86: - * 87-98: - * ?99: - Right Ventricle (RV): RVESVi (mL/m) * ?87: ?44 RVEF (%) * ?87: ?51 Atria: LA maximum volume index (mL/m) * 34 (16-53): 36 (18-55) RAESVi (mL/m) * 34 (16-53): 39 (18-59) RA maximum volume index (mL/m) * 34 (16-53): 40 (19-61) Reference Ranges for Cardiac MRI (Adult Females) LVESVi (mL/m) * ?61: ?24 * 62-70: 25-30 * 71-79: 31-36 * ?80: ?37 LV Mass Index (g/m) * ?61: 43-95 * 62-70: - * 71-79: - * ?80: - LVEF (%) * ?61: ?60 * 62-70: - * 71-79: - * ?80: - Right Ventricle (RV): RVESVi (mL/m) * ?74: ?32 RVEF (%) * ?74: ?54 Atria: LA maximum volume index (mL/m) * 33 (15-52): 35 (17-54) RAESVi (mL/m) * 33 (15-52): 37 (17-57) RA maximum volume index (mL/m) * 33 (15-52): 38 (18-58) Source: Society for Cardiovascular Magnetic Resonance Armed forces training and education officer) and Journal of Cardiovascular Magnetic Resonance (JCMR), 7976-7975 Electronically Signed   By: Stanly Leavens M.D.   On: 10/11/2023 14:18   MR CARDIAC VELOCITY FLOW MAP Result Date: 10/11/2023 CLINICAL DATA:  Clinical question of heart failure Study assumes HCT of 34 and BSA of 2.26 m2. EXAM: CARDIAC MRI TECHNIQUE: The patient was scanned on a 1.5 Tesla GE magnet. A dedicated cardiac coil was used. Functional imaging was done using Fiesta sequences. 2,3, and 4 chamber views were done to assess for RWMA's. Modified Simpson's rule using was used to calculate an ejection fraction on a dedicated work Research officer, trade [employer]. The patient received 10 cc of Gadavist . After 10 minutes inversion recovery sequences were used to assess for infiltration and scar tissue. Flow quantification was performed 2 times during this examination with flow quantification performed at the levels of the ascending aorta above the valve, pulmonary artery above the valve. CONTRAST:  10 cc  of Gadavist  FINDINGS: 1. Severely dilated left ventricular size, with LVEDD 67 mm, and LVEDVi 131 mL/m2. Normal left  ventricular thickness. Severe decrease in left ventricular systolic function (LVEF =28%). There are no regional wall motion abnormalities, but global hypokinesis. Left ventricular parametric mapping notable for normal T2. ECV elevation in the basal inferior septum, 42%. There is no late gadolinium enhancement in the left ventricular myocardium. Normal resting perfusion. 2. Dilated right ventricular size with RVEDVI 109 mL/m2. Normal right ventricular thickness. Moderately decreased right ventricular systolic function (RVEF =32%). Relative septal hypokinesis. 3.  Bi-atrial dilation.  IVC dilation, 2.2 cm. 4. Normal size of the aortic root, ascending aorta and pulmonary artery. 5. Valve assessment: Aortic Valve: Tri-leaflet aortic valve. Qualitatively, there is no significant regurgitation. Regurgitant fraction <1 %. Mean gradient 1.6 Mm Hg. Pulmonic Valve: Mild regurgitation.  Regurgitant fraction 10 %. Tricuspid Valve: Morphology not well visualized. Moderate regurgitation. Regurgitant fraction 28 %. Mitral Valve: Ventricular functional moderate regurgitation. Regurgitant fraction 21 %. 6. Normal pericardium. Small circumferential pericardial effusion without  tamponade physiology. 7. Grossly, no extracardiac findings. Recommended dedicated study if concerned for non-cardiac pathology. 8. Breathhold artifacts noted. This decreased the sensitivity of volumetric and flow assessments. IMPRESSION: 1. Dilated left ventricle with severe decrease in function, LVEF 28%. 2. Dilated right ventricle with moderately decreased function, RVEF 32%. 3.  Biatrial dilation. 4.  Moderate mitral and tricuspid regurgitation. Stanly Leavens MD Reference Ranges for Cardiac MRI (Adult Males) Left Ventricle (LV): LVESVi (mL/m) * ?74: ?30 * 75-86: 31-38 * 87-98: 39-44 * ?99: ?45 LV Mass Index (g/m) * ?74: 49-115 * 75-86: - * 87-98: - * ?99: - LVEF (%) * ?74: ?58 * 75-86: - * 87-98: - * ?99: - Right Ventricle (RV): RVESVi (mL/m) * ?87:  ?44 RVEF (%) * ?87: ?51 Atria: LA maximum volume index (mL/m) * 34 (16-53): 36 (18-55) RAESVi (mL/m) * 34 (16-53): 39 (18-59) RA maximum volume index (mL/m) * 34 (16-53): 40 (19-61) Reference Ranges for Cardiac MRI (Adult Females) LVESVi (mL/m) * ?61: ?24 * 62-70: 25-30 * 71-79: 31-36 * ?80: ?37 LV Mass Index (g/m) * ?61: 43-95 * 62-70: - * 71-79: - * ?80: - LVEF (%) * ?61: ?60 * 62-70: - * 71-79: - * ?80: - Right Ventricle (RV): RVESVi (mL/m) * ?74: ?32 RVEF (%) * ?74: ?54 Atria: LA maximum volume index (mL/m) * 33 (15-52): 35 (17-54) RAESVi (mL/m) * 33 (15-52): 37 (17-57) RA maximum volume index (mL/m) * 33 (15-52): 38 (18-58) Source: Society for Cardiovascular Magnetic Resonance Armed forces training and education officer) and Journal of Cardiovascular Magnetic Resonance (JCMR), 7976-7975 Electronically Signed   By: Stanly Leavens M.D.   On: 10/11/2023 14:18   MR CARDIAC VELOCITY FLOW MAP Result Date: 10/11/2023 CLINICAL DATA:  Clinical question of heart failure Study assumes HCT of 34 and BSA of 2.26 m2. EXAM: CARDIAC MRI TECHNIQUE: The patient was scanned on a 1.5 Tesla GE magnet. A dedicated cardiac coil was used. Functional imaging was done using Fiesta sequences. 2,3, and 4 chamber views were done to assess for RWMA's. Modified Simpson's rule using was used to calculate an ejection fraction on a dedicated work Research officer, trade union. The patient received 10 cc of Gadavist . After 10 minutes inversion recovery sequences were used to assess for infiltration and scar tissue. Flow quantification was performed 2 times during this examination with flow quantification performed at the levels of the ascending aorta above the valve, pulmonary artery above the valve. CONTRAST:  10 cc  of Gadavist  FINDINGS: 1. Severely dilated left ventricular size, with LVEDD 67 mm, and LVEDVi 131 mL/m2. Normal left ventricular thickness. Severe decrease in left ventricular systolic function (LVEF =28%). There are no regional wall motion abnormalities,  but global hypokinesis. Left ventricular parametric mapping notable for normal T2. ECV elevation in the basal inferior septum, 42%. There is no late gadolinium enhancement in the left ventricular myocardium. Normal resting perfusion. 2. Dilated right ventricular size with RVEDVI 109 mL/m2. Normal right ventricular thickness. Moderately decreased right ventricular systolic function (RVEF =32%). Relative septal hypokinesis. 3.  Bi-atrial dilation.  IVC dilation, 2.2 cm. 4. Normal size of the aortic root, ascending aorta and pulmonary artery. 5. Valve assessment: Aortic Valve: Tri-leaflet aortic valve. Qualitatively, there is no significant regurgitation. Regurgitant fraction <1 %. Mean gradient 1.6 Mm Hg. Pulmonic Valve: Mild regurgitation.  Regurgitant fraction 10 %. Tricuspid Valve: Morphology not well visualized. Moderate regurgitation. Regurgitant fraction 28 %. Mitral Valve: Ventricular functional moderate regurgitation. Regurgitant fraction 21 %. 6. Normal pericardium. Small circumferential pericardial effusion without  tamponade physiology. 7. Grossly, no extracardiac findings. Recommended dedicated study if concerned for non-cardiac pathology. 8. Breathhold artifacts noted. This decreased the sensitivity of volumetric and flow assessments. IMPRESSION: 1. Dilated left ventricle with severe decrease in function, LVEF 28%. 2. Dilated right ventricle with moderately decreased function, RVEF 32%. 3.  Biatrial dilation. 4.  Moderate mitral and tricuspid regurgitation. Stanly Leavens MD Reference Ranges for Cardiac MRI (Adult Males) Left Ventricle (LV): LVESVi (mL/m) * ?74: ?30 * 75-86: 31-38 * 87-98: 39-44 * ?99: ?45 LV Mass Index (g/m) * ?74: 49-115 * 75-86: - * 87-98: - * ?99: - LVEF (%) * ?74: ?58 * 75-86: - * 87-98: - * ?99: - Right Ventricle (RV): RVESVi (mL/m) * ?87: ?44 RVEF (%) * ?87: ?51 Atria: LA maximum volume index (mL/m) * 34 (16-53): 36 (18-55) RAESVi (mL/m) * 34 (16-53): 39 (18-59) RA maximum  volume index (mL/m) * 34 (16-53): 40 (19-61) Reference Ranges for Cardiac MRI (Adult Females) LVESVi (mL/m) * ?61: ?24 * 62-70: 25-30 * 71-79: 31-36 * ?80: ?37 LV Mass Index (g/m) * ?61: 43-95 * 62-70: - * 71-79: - * ?80: - LVEF (%) * ?61: ?60 * 62-70: - * 71-79: - * ?80: - Right Ventricle (RV): RVESVi (mL/m) * ?74: ?32 RVEF (%) * ?74: ?54 Atria: LA maximum volume index (mL/m) * 33 (15-52): 35 (17-54) RAESVi (mL/m) * 33 (15-52): 37 (17-57) RA maximum volume index (mL/m) * 33 (15-52): 38 (18-58) Source: Society for Cardiovascular Magnetic Resonance Armed forces training and education officer) and Journal of Cardiovascular Magnetic Resonance (JCMR), 7976-7975 Electronically Signed   By: Stanly Leavens M.D.   On: 10/11/2023 14:18   CARDIAC CATHETERIZATION Result Date: 10/10/2023   Prox RCA to Mid RCA lesion is 20% stenosed.   The left ventricular ejection fraction is 25-35% by visual estimate. Findings: Ao = 104/72 (87) LV = 117/24 RA =  9 RV = 39/12 PA = 38/27 (30) PCW = 18 Fick cardiac output/index = 7.0/3.2 Thermo CO/CI = 4.3/1.9 PVR = 1.7 (Fick) 2.8 (TD) Ao sat = 94% PA sat = 65% PAPi = 1.2 Assessment: 1. Minimal non-obstructive CAD 2. Severe NICM EF 20-25% 3. Elevated volume status with low cardiac output Plan/Discussion: Continue diuresis and titration of GDMT. cMRI today Toribio Fuel, MD 12:36 PM  VAS US  LOWER EXTREMITY VENOUS (DVT) Result Date: 10/09/2023  Lower Venous DVT Study Patient Name:  EILIYAH REH [facility]  Date of Exam:   10/09/2023 Medical Rec #: [id number]           Accession #:    [id number] Date of Birth: 12-25-1960          Patient Gender: F Patient Age:   46 years Exam Location:  [facility] Procedure:      VAS US  LOWER EXTREMITY VENOUS (DVT) Referring Phys: EKTA PATEL --------------------------------------------------------------------------------  Indications: Edema.  Limitations: Body habitus. Performing Technologist: Elmarie Lindau, RVT  Examination Guidelines: A complete evaluation includes B-mode imaging,  spectral Doppler, color Doppler, and power Doppler as needed of all accessible portions of each vessel. Bilateral testing is considered an integral part of a complete examination. Limited examinations for reoccurring indications may be performed as noted. The reflux portion of the exam is performed with the patient in reverse Trendelenburg.  +---------+---------------+---------+-----------+----------+--------------+ RIGHT    CompressibilityPhasicitySpontaneityPropertiesThrombus Aging +---------+---------------+---------+-----------+----------+--------------+ CFV      Full           Yes      Yes                                 +---------+---------------+---------+-----------+----------+--------------+  SFJ      Full                                                        +---------+---------------+---------+-----------+----------+--------------+ FV Prox  Full                                                        +---------+---------------+---------+-----------+----------+--------------+ FV Mid   Full                                                        +---------+---------------+---------+-----------+----------+--------------+ FV DistalFull                                                        +---------+---------------+---------+-----------+----------+--------------+ PFV      Full                                                        +---------+---------------+---------+-----------+----------+--------------+ POP      Full           Yes      Yes                                 +---------+---------------+---------+-----------+----------+--------------+ PTV      Full                                                        +---------+---------------+---------+-----------+----------+--------------+ PERO     Full                                                        +---------+---------------+---------+-----------+----------+--------------+    +---------+---------------+---------+-----------+----------+--------------+ LEFT     CompressibilityPhasicitySpontaneityPropertiesThrombus Aging +---------+---------------+---------+-----------+----------+--------------+ CFV      Full           Yes      Yes                                 +---------+---------------+---------+-----------+----------+--------------+ SFJ      Full                                                        +---------+---------------+---------+-----------+----------+--------------+  FV Prox  Full                                                        +---------+---------------+---------+-----------+----------+--------------+ FV Mid   Full                                                        +---------+---------------+---------+-----------+----------+--------------+ FV DistalFull                                                        +---------+---------------+---------+-----------+----------+--------------+ PFV      Full                                                        +---------+---------------+---------+-----------+----------+--------------+ POP      Full           Yes      Yes                                 +---------+---------------+---------+-----------+----------+--------------+ PTV      Full                                                        +---------+---------------+---------+-----------+----------+--------------+ PERO     Full                                                        +---------+---------------+---------+-----------+----------+--------------+     Summary: RIGHT: - There is no evidence of deep vein thrombosis in the lower extremity.  - No cystic structure found in the popliteal fossa.  LEFT: - There is no evidence of deep vein thrombosis in the lower extremity.  - No cystic structure found in the popliteal fossa.  *See table(s) above for measurements and observations. Electronically signed  by Debby Robertson on 10/09/2023 at 9:15:31 PM.    Final    ECHOCARDIOGRAM COMPLETE Result Date: 10/09/2023    ECHOCARDIOGRAM REPORT   Patient Name:   LIZZET HENDLEY Date of Exam: 10/09/2023 Medical Rec #:  [id number]          Height:       67.0 in Accession #:    [id number]         Weight:       250.5 lb Date of Birth:  1961-01-03         BSA:          2.225 m Patient Age:  62 years           BP:           109/70 mmHg Patient Gender: F                  HR:           105 bpm. Exam Location:  Inpatient Procedure: 2D Echo, Cardiac Doppler, Color Doppler and Intracardiac            Opacification Agent (Both Spectral and Color Flow Doppler were            utilized during procedure). Indications:    Pulmonary Embolus I26.09, CHF I50.9  History:        Patient has no prior history of Echocardiogram examinations.                 Signs/Symptoms:Shortness of Breath.  Sonographer:    Thea Norlander RCS Referring Phys: TOBIE MODEST, V IMPRESSIONS  1. Left ventricular ejection fraction, by estimation, is 20 to 25%. The left ventricle has severely decreased function. The left ventricle demonstrates global hypokinesis. The left ventricular internal cavity size was moderately dilated. Left ventricular diastolic parameters are indeterminate.  2. Right ventricular systolic function is severely reduced. The right ventricular size is normal. There is moderately elevated pulmonary artery systolic pressure. The estimated right ventricular systolic pressure is 47.9 mmHg.  3. Left atrial size was moderately dilated.  4. Right atrial size was moderately dilated.  5. There is no evidence of cardiac tamponade.  6. The mitral valve is normal in structure. Severe mitral valve regurgitation. No evidence of mitral stenosis.  7. Tricuspid valve regurgitation is severe.  8. The aortic valve is normal in structure. Aortic valve regurgitation is not visualized. No aortic stenosis is present.  9. The inferior vena cava is dilated in size with  <50% respiratory variability, suggesting right atrial pressure of 15 mmHg. FINDINGS  Left Ventricle: Left ventricular ejection fraction, by estimation, is 20 to 25%. The left ventricle has severely decreased function. The left ventricle demonstrates global hypokinesis. Definity  contrast agent was given IV to delineate the left ventricular endocardial borders. The left ventricular internal cavity size was moderately dilated. There is no left ventricular hypertrophy. Left ventricular diastolic parameters are indeterminate. Right Ventricle: The right ventricular size is normal. No increase in right ventricular wall thickness. Right ventricular systolic function is severely reduced. There is moderately elevated pulmonary artery systolic pressure. The tricuspid regurgitant velocity is 2.87 m/s, and with an assumed right atrial pressure of 15 mmHg, the estimated right ventricular systolic pressure is 47.9 mmHg. Left Atrium: Left atrial size was moderately dilated. Right Atrium: Right atrial size was moderately dilated. Pericardium: Trivial pericardial effusion is present. The pericardial effusion is posterior to the left ventricle. There is no evidence of cardiac tamponade. Mitral Valve: The mitral valve is normal in structure. Severe mitral valve regurgitation, with centrally-directed jet. No evidence of mitral valve stenosis. Tricuspid Valve: The tricuspid valve is normal in structure. Tricuspid valve regurgitation is severe. No evidence of tricuspid stenosis. Aortic Valve: The aortic valve is normal in structure. Aortic valve regurgitation is not visualized. No aortic stenosis is present. Aortic valve peak gradient measures 8.8 mmHg. Pulmonic Valve: The pulmonic valve was normal in structure. Pulmonic valve regurgitation is trivial. No evidence of pulmonic stenosis. Aorta: The aortic root is normal in size and structure. Venous: The inferior vena cava is dilated in size with less than 50% respiratory variability,  suggesting right atrial pressure  of 15 mmHg. IAS/Shunts: No atrial level shunt detected by color flow Doppler.  LEFT VENTRICLE PLAX 2D LVIDd:         6.40 cm   Diastology LVIDs:         5.30 cm   LV e' medial:    6.74 cm/s LV PW:         1.10 cm   LV E/e' medial:  13.9 LV IVS:        1.00 cm   LV e' lateral:   15.10 cm/s LVOT diam:     2.00 cm   LV E/e' lateral: 6.2 LV SV:         46 LV SV Index:   21 LVOT Area:     3.14 cm  RIGHT VENTRICLE             IVC RV S prime:     13.90 cm/s  IVC diam: 3.10 cm TAPSE (M-mode): 2.0 cm LEFT ATRIUM             Index        RIGHT ATRIUM           Index LA diam:        5.20 cm 2.34 cm/m   RA Area:     18.10 cm LA Vol (A2C):   67.5 ml 30.33 ml/m  RA Volume:   51.55 ml  23.17 ml/m LA Vol (A4C):   77.9 ml 35.01 ml/m LA Biplane Vol: 76.3 ml 34.29 ml/m  AORTIC VALVE AV Area (Vmax): 2.29 cm AV Vmax:        148.00 cm/s AV Peak Grad:   8.8 mmHg LVOT Vmax:      108.00 cm/s LVOT Vmean:     68.000 cm/s LVOT VTI:       0.148 m  AORTA Ao Root diam: 3.10 cm Ao Asc diam:  3.30 cm MITRAL VALVE               TRICUSPID VALVE MV Area (PHT): 5.88 cm    TR Peak grad:   32.9 mmHg MV Decel Time: 129 msec    TR Vmax:        287.00 cm/s MV E velocity: 93.80 cm/s MV A velocity: 52.20 cm/s  SHUNTS MV E/A ratio:  1.80        Systemic VTI:  0.15 m                            Systemic Diam: 2.00 cm Toribio Fuel MD Electronically signed by Toribio Fuel MD Signature Date/Time: 10/09/2023/1:35:18 PM    Final    US  Abdomen Limited RUQ (LIVER/GB) Result Date: 10/09/2023 CLINICAL DATA:  Transaminitis EXAM: ULTRASOUND ABDOMEN LIMITED RIGHT UPPER QUADRANT COMPARISON:  CT 10/12/2013 FINDINGS: Gallbladder: No gallstones or wall thickening visualized. No sonographic Murphy sign noted by sonographer. Common bile duct: Diameter: 2.6 mm, normal. Liver: No focal lesion identified. Within normal limits in parenchymal echogenicity. Portal vein is patent on color Doppler imaging with normal direction of blood  flow towards the liver. Sexton: None. IMPRESSION: Normal right upper quadrant ultrasound. Electronically Signed   By: Oneil Officer M.D.   On: 10/09/2023 10:00   CT Angio Chest PE W and/or Wo Contrast Result Date: 10/08/2023 CLINICAL DATA:  Shortness of breath and chest pain EXAM: CT ANGIOGRAPHY CHEST WITH CONTRAST TECHNIQUE: Multidetector CT imaging of the chest was performed using the standard protocol during bolus administration of intravenous contrast. Multiplanar  CT image reconstructions and MIPs were obtained to evaluate the vascular anatomy. RADIATION DOSE REDUCTION: This exam was performed according to the departmental dose-optimization program which includes automated exposure control, adjustment of the mA and/or kV according to patient size and/or use of iterative reconstruction technique. CONTRAST:  75mL OMNIPAQUE  IOHEXOL  350 MG/ML SOLN COMPARISON:  Chest x-ray 10/07/2023 FINDINGS: Cardiovascular: Slightly degraded by motion. Positive for small acute right lower lobe subsegmental embolus, coronal series 9 image 46 and sagittal series 10 image 65. No Sexton discrete filling defects are visualized. Nonaneurysmal aorta. Cardiomegaly. No sizable pericardial effusion Mediastinum/Nodes: Patent trachea. No suspicious thyroid mass. Subcentimeter mediastinal lymph nodes. Esophagus within normal limits. Lungs/Pleura: No sizable pleural effusion. Wedge shaped and somewhat bandlike density in the left upper lobe with associated minimal bronchiectasis, favored to represent focal scarring. Mild smooth septal thickening most evident at the bases, could be due to mild edema. Minimal subpleural scarring in the left upper lobe. Upper Abdomen: Reflux of contrast into the hepatic veins consistent with elevated right heart pressure. No acute finding. Musculoskeletal: Right breast implant. No acute osseous abnormality. Review of the MIP images confirms the above findings. IMPRESSION: 1. Slightly degraded by motion. Findings  suspicious for small acute right lower lobe subsegmental embolus. 2. Cardiomegaly with reflux of contrast into the hepatic veins consistent with elevated right heart pressure. Mild smooth septal thickening most evident at the bases, could be due to mild edema. 3. Wedge shaped and somewhat bandlike density in the left upper lobe with associated minimal bronchiectasis, favored to represent focal scarring. Critical Value/emergent results were called by telephone at the time of interpretation on 10/08/2023 at 1:40 am to provider VICENTA ABLE , who verbally acknowledged these results. Electronically Signed   By: Luke Bun M.D.   On: 10/08/2023 01:40    Microbiology: Results for orders placed or performed during the hospital encounter of 10/07/23  MRSA Next Gen by PCR, Nasal     Status: None   Collection Time: 10/08/23  6:06 PM   Specimen: Nasal Mucosa; Nasal Swab  Result Value Ref Range Status   MRSA by PCR Next Gen NOT DETECTED NOT DETECTED Final    Comment: (NOTE) The GeneXpert MRSA Assay (FDA approved for NASAL specimens only), is one component of a comprehensive MRSA colonization surveillance program. It is not intended to diagnose MRSA infection nor to guide or monitor treatment for MRSA infections. Test performance is not FDA approved in patients less than 36 years old. Performed at [facility], [address] [place], [place] [address]     Labs: CBC: Recent Labs  Lab 10/08/23 0020 10/09/23 0437 10/10/23 0249 10/10/23 1051 10/10/23 1056 10/11/23 0215 10/12/23 0248  WBC 6.8 7.8 7.9  --   --  6.5 5.7  NEUTROABS 4.1  --   --   --   --   --   --   HGB 10.2* 10.6* 10.7* 8.2* 11.2*  10.5* 11.1* 12.1  HCT 32.2* 34.0* 34.0* 24.0* 33.0*  31.0* 34.5* 39.0  MCV 87.0 86.5 85.9  --   --  85.4 86.7  PLT 269 260 306  --   --  314 318   Basic Metabolic Panel: Recent Labs  Lab 10/08/23 0020 10/08/23 1714 10/09/23 0437 10/10/23 0249 10/10/23 1051 10/10/23 1056  10/11/23 0215 10/12/23 0248  NA 138  --  140 137 148* 138  134* 137 134*  K 3.9  --  3.6 3.7 2.6* 3.6  3.2* 3.9 3.9  CL 106  --  101 100  --   --  100 97*  CO2 20*  --  25 25  --   --  26 25  GLUCOSE 115*  --  99 110*  --   --  109* 102*  BUN 17  --  10 15  --   --  17 21*  CREATININE 0.97  --  0.93 0.89  --   --  0.94 1.15*  CALCIUM  8.8*  --  8.6* 9.2  --   --  9.1 9.4  MG  --  1.8  --   --   --   --  2.0 2.2   Liver Function Tests: Recent Labs  Lab 10/08/23 1714 10/09/23 0437 10/10/23 0249 10/11/23 0215  AST 81* 58* 34 25  ALT 173* 148* 108* 79*  ALKPHOS 107 96 93 90  BILITOT 2.1* 2.4* 1.2 1.1  PROT 6.5 6.3* 6.6 5.5*  ALBUMIN 3.3* 3.2* 3.2* 3.1*   CBG: No results for input(s): GLUCAP in the last 168 hours.  Discharge time spent: 38 minutes.  Length of inpatient stay: 3 days  Signed: Carliss LELON Canales, DO [facility] 10/12/2023

## 2023-10-12 NOTE — Plan of Care (Signed)
   Problem: Education: Goal: Knowledge of General Education information will improve Description: Including pain rating scale, medication(s)/side effects and non-pharmacologic comfort measures Outcome: Progressing   Problem: Clinical Measurements: Goal: Will remain free from infection Outcome: Progressing Goal: Diagnostic test results will improve Outcome: Progressing   Problem: Nutrition: Goal: Adequate nutrition will be maintained Outcome: Progressing   Problem: Coping: Goal: Level of anxiety will decrease Outcome: Progressing

## 2023-10-12 NOTE — Progress Notes (Signed)
 Advanced Heart Failure Rounding Note  Cardiologist: None  Chief Complaint: Acute systolic heart failuare Subjective:    Feels good. Denies CP or SOB,. Diuresed almost 3L overnight.  cMRI LVEF 28% RVEF 32% No LGE  Objective:   Weight Range: 108.2 kg Body mass index is 37.36 kg/m.   Vital Signs:   Temp:  [97.6 F (36.4 C)-98.2 F (36.8 C)] 97.6 F (36.4 C) (08/24 1118) Pulse Rate:  [77-93] 83 (08/24 1118) Resp:  [12-19] 19 (08/24 1118) BP: (101-118)/(61-93) 106/67 (08/24 1118) SpO2:  [94 %-99 %] 98 % (08/24 0733) Weight:  [108.2 kg] 108.2 kg (08/24 0500)    Weight change: Filed Weights   10/10/23 1300 10/11/23 0531 10/12/23 0500  Weight: 108 kg 106.6 kg 108.2 kg    Intake/Output:   Intake/Output Summary (Last 24 hours) at 10/12/2023 1326 Last data filed at 10/12/2023 1120 Gross per 24 hour  Intake 960 ml  Output 3400 ml  Net -2440 ml      Physical Exam    General:  Well appearing. No resp difficulty HEENT: normal Neck: supple. no JVD. Carotids 2+ bilat; no bruits. No lymphadenopathy or thryomegaly appreciated. Cor: PMI nondisplaced. Regular rate & rhythm.2/6 TR Lungs: clear Abdomen: soft, nontender, nondistended. No hepatosplenomegaly. No bruits or masses. Good bowel sounds. Extremities: no cyanosis, clubbing, rash, edema Neuro: alert & orientedx3, cranial nerves grossly intact. moves all 4 extremities w/o difficulty. Affect pleasant   Telemetry   NSR 80s Personally reviewed  Labs    CBC Recent Labs    10/11/23 0215 10/12/23 0248  WBC 6.5 5.7  HGB 11.1* 12.1  HCT 34.5* 39.0  MCV 85.4 86.7  PLT 314 318   Basic Metabolic Panel Recent Labs    91/76/74 0215 10/12/23 0248  NA 137 134*  K 3.9 3.9  CL 100 97*  CO2 26 25  GLUCOSE 109* 102*  BUN 17 21*  CREATININE 0.94 1.15*  CALCIUM  9.1 9.4  MG 2.0 2.2   Liver Function Tests Recent Labs    10/10/23 0249 10/11/23 0215  AST 34 25  ALT 108* 79*  ALKPHOS 93 90  BILITOT 1.2 1.1   PROT 6.6 5.5*  ALBUMIN 3.2* 3.1*   No results for input(s): LIPASE, AMYLASE in the last 72 hours. Cardiac Enzymes No results for input(s): CKTOTAL, CKMB, CKMBINDEX, TROPONINI in the last 72 hours.  BNP: BNP (last 3 results) No results for input(s): BNP in the last 8760 hours.  ProBNP (last 3 results) Recent Labs    10/08/23 0020  PROBNP 4,583.0*     D-Dimer No results for input(s): DDIMER in the last 72 hours. Hemoglobin A1C No results for input(s): HGBA1C in the last 72 hours. Fasting Lipid Panel Recent Labs    10/09/23 1650  CHOL 112  HDL 41  LDLCALC 54  TRIG 84  CHOLHDL 2.7   Thyroid Function Tests Recent Labs    10/09/23 1650  TSH 0.879    Other results:   Imaging    No results found.    Medications:     Scheduled Medications:  apixaban   10 mg Oral BID   Followed by   NOREEN ON 10/17/2023] apixaban   5 mg Oral BID   bisacodyl   10 mg Oral Once   digoxin   0.125 mg Oral Daily   empagliflozin   10 mg Oral Daily   fentaNYL  (SUBLIMAZE ) injection  50 mcg Intravenous Once   [START ON 10/13/2023] ferrous sulfate   325 mg Oral Q breakfast  gabapentin   300 mg Oral TID   losartan   25 mg Oral Daily   multivitamin with minerals  1 tablet Oral Daily   sodium chloride  flush  3 mL Intravenous Q12H   sodium chloride  flush  3 mL Intravenous Q12H   sodium chloride  flush  3 mL Intravenous Q12H   spironolactone   12.5 mg Oral Daily    Infusions:    PRN Medications: acetaminophen  **OR** acetaminophen , bisacodyl , cyclobenzaprine , hydrALAZINE , ondansetron  **OR** ondansetron  (ZOFRAN ) IV, polyethylene glycol, sodium chloride  flush, sodium chloride  flush    Patient Profile   Heidi Sexton is a 63 y.o. female with hx of bilateral breast cancer s/p mastectomy and chemo (adriamycin and cytoxan 96', taxotere  carboplatin  and taxol  15'-16'), s/p bilateral salpingo-oophorectomy. Admitted with PE, found to have acute systolic heart failure on  echo.   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  Need to rule out ICM - NYHA IV on admission - Cath 8/22 Minimal non-obs CAD (20%RCA) - cMRI LVEF 28% RVEF 32% No LGE - Increase Spiro to 25 mg daily - Continue digoxin  0.125 mcg/daily - Continue losartan  25 daily  - Continue Jardiance  - No b-blocker yet - would d/c on lasix  40mg  on prn basis only.    PE - CT on admission consistent with RLL subsegmental embolus - Continue Eliquis    Valvular heart disease - Severe MR and TR on echo this admission - Will need repeat echo as outpatient when HF optimized   Chest tightness - HsTrop 29>29> 26> 26 - Cath no sig CAD   Elevated LFTs - AST/ALT 58/148, down trending - Suspect hepatic congestion - RUQ ABD US  with no acute findings   Hx bilateral breast cancer, s/p mastectomy and BSO - Followed by Dr. Odean - s/p adriamycin and cytoxan 96', taxotere  carboplatin  and taxol  15'-16'  7. IDA - tSat 6, ferritin 66. Has rec'd IV iron    8. CAD - minimal - add atorva 40  Ok to go home today on current meds. Will arrange f/u in HF Clinic    Length of Stay: 3  Toribio Fuel, MD  10/12/2023, 1:26 PM  Advanced Heart Failure Team Pager [phone] (M-F; 7a - 5p)  Please contact [facility] for night-coverage after hours (5p -7a ) and weekends on [email or web address]

## 2023-10-12 NOTE — Progress Notes (Signed)
 Progress Note   Patient: Heidi Sexton FMW:[id number] DOB: Jan 04, 1961 DOA: 10/07/2023  DOS: the patient was seen and examined on 10/12/2023   Brief hospital course:  63 y.o. female with bilateral breast CA, s/p mastectomy, radiation therapy, chemo, BSO, neuropathy.  Presented with worsening lower extremity edema, chest tightness, elevated dimer.  Subsequently diagnosed with PE and HFrEF.   Assessment and Plan:   Acute pulmonary embolism - Currently on heparin  drip.  No hypoxia.  Eliquis .   Acute HFrEF - EF on echo noted 20-25%.  LV and RV severely reduced.  Followed closely by cardiology and advanced heart failure.  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  Cardiac cath 8/22 showing non-ischemic cardiomyopathy.  Continues on GDMT per [facility] (spironolactone , digoxin , losartan , Jardiance ).  Cardiac MRI completed.  Dissipate discharge later today or tomorrow.  Follow-up with cardiology in 1 week.   Severe MR/TR - Noted on echo.  Following with cardiology as above.     Transaminitis - Likely secondary to congestion from HFrEF.  Continues downtrending.  Will recheck LFTs in AM.   Iron  deficiency anemia - Noted low iron  stores with elevated TIBC suggesting of chronic iron  deficiency.  S/p IV.  Will transition to p.o. iron .   Subjective: Patient feeling well this morning.  Denying any fever, chills, chest pain, nausea, vomiting abdominal pain.  Motivated to go home if possible.  Physical Exam:  Vitals:   10/12/23 0400 10/12/23 0500 10/12/23 0733 10/12/23 1118  BP: 105/61  101/61 106/67  Pulse: 77 78 89 83  Resp: 14 12 19 19   Temp: 97.8 F (36.6 C)  98.2 F (36.8 C) 97.6 F (36.4 C)  TempSrc: Oral  Oral Oral  SpO2: 94% 95% 98%   Weight:  108.2 kg    Height:        GENERAL:  Alert, pleasant, no acute distress  HEENT:  EOMI CARDIOVASCULAR:  RRR, murmur appreciated RESPIRATORY:  Clear to auscultation, no wheezing, rales, or rhonchi GASTROINTESTINAL:  Soft, nontender,  nondistended EXTREMITIES:  No LE edema bilaterally NEURO:  No new focal deficits appreciated SKIN:  No rashes noted PSYCH:  Appropriate mood and affect    Data Reviewed:  Imaging Studies: MR CARDIAC MORPHOLOGY W WO CONTRAST Result Date: 10/11/2023 CLINICAL DATA:  Clinical question of heart failure Study assumes HCT of 34 and BSA of 2.26 m2. EXAM: CARDIAC MRI TECHNIQUE: The patient was scanned on a 1.5 Tesla GE magnet. A dedicated cardiac coil was used. Functional imaging was done using Fiesta sequences. 2,3, and 4 chamber views were done to assess for RWMA's. Modified Simpson's rule using was used to calculate an ejection fraction on a dedicated work Research officer, [employer]. The patient received 10 cc of Gadavist . After 10 minutes inversion recovery sequences were used to assess for infiltration and scar tissue. Flow quantification was performed 2 times during this examination with flow quantification performed at the levels of the ascending aorta above the valve, pulmonary artery above the valve. CONTRAST:  10 cc  of Gadavist  FINDINGS: 1. Severely dilated left ventricular size, with LVEDD 67 mm, and LVEDVi 131 mL/m2. Normal left ventricular thickness. Severe decrease in left ventricular systolic function (LVEF =28%). There are no regional wall motion abnormalities, but global hypokinesis. Left ventricular parametric mapping notable for normal T2. ECV elevation in the basal inferior septum, 42%. There is no late gadolinium enhancement in the left ventricular myocardium. Normal resting perfusion. 2. Dilated right ventricular size with RVEDVI 109 mL/m2. Normal right ventricular thickness. Moderately decreased right ventricular  systolic function (RVEF =32%). Relative septal hypokinesis. 3.  Bi-atrial dilation.  IVC dilation, 2.2 cm. 4. Normal size of the aortic root, ascending aorta and pulmonary artery. 5. Valve assessment: Aortic Valve: Tri-leaflet aortic valve. Qualitatively, there is no significant  regurgitation. Regurgitant fraction <1 %. Mean gradient 1.6 Mm Hg. Pulmonic Valve: Mild regurgitation.  Regurgitant fraction 10 %. Tricuspid Valve: Morphology not well visualized. Moderate regurgitation. Regurgitant fraction 28 %. Mitral Valve: Ventricular functional moderate regurgitation. Regurgitant fraction 21 %. 6. Normal pericardium. Small circumferential pericardial effusion without tamponade physiology. 7. Grossly, no extracardiac findings. Recommended dedicated study if concerned for non-cardiac pathology. 8. Breathhold artifacts noted. This decreased the sensitivity of volumetric and flow assessments. IMPRESSION: 1. Dilated left ventricle with severe decrease in function, LVEF 28%. 2. Dilated right ventricle with moderately decreased function, RVEF 32%. 3.  Biatrial dilation. 4.  Moderate mitral and tricuspid regurgitation. Stanly Leavens MD Reference Ranges for Cardiac MRI (Adult Males) Left Ventricle (LV): LVESVi (mL/m) * ?74: ?30 * 75-86: 31-38 * 87-98: 39-44 * ?99: ?45 LV Mass Index (g/m) * ?74: 49-115 * 75-86: - * 87-98: - * ?99: - LVEF (%) * ?74: ?58 * 75-86: - * 87-98: - * ?99: - Right Ventricle (RV): RVESVi (mL/m) * ?87: ?44 RVEF (%) * ?87: ?51 Atria: LA maximum volume index (mL/m) * 34 (16-53): 36 (18-55) RAESVi (mL/m) * 34 (16-53): 39 (18-59) RA maximum volume index (mL/m) * 34 (16-53): 40 (19-61) Reference Ranges for Cardiac MRI (Adult Females) LVESVi (mL/m) * ?61: ?24 * 62-70: 25-30 * 71-79: 31-36 * ?80: ?37 LV Mass Index (g/m) * ?61: 43-95 * 62-70: - * 71-79: - * ?80: - LVEF (%) * ?61: ?60 * 62-70: - * 71-79: - * ?80: - Right Ventricle (RV): RVESVi (mL/m) * ?74: ?32 RVEF (%) * ?74: ?54 Atria: LA maximum volume index (mL/m) * 33 (15-52): 35 (17-54) RAESVi (mL/m) * 33 (15-52): 37 (17-57) RA maximum volume index (mL/m) * 33 (15-52): 38 (18-58) Source: Society for Cardiovascular Magnetic Resonance Armed forces training and education officer) and Journal of Cardiovascular Magnetic Resonance (JCMR), 7976-7975 Electronically Signed    By: Stanly Leavens M.D.   On: 10/11/2023 14:18   MR CARDIAC VELOCITY FLOW MAP Result Date: 10/11/2023 CLINICAL DATA:  Clinical question of heart failure Study assumes HCT of 34 and BSA of 2.26 m2. EXAM: CARDIAC MRI TECHNIQUE: The patient was scanned on a 1.5 Tesla GE magnet. A dedicated cardiac coil was used. Functional imaging was done using Fiesta sequences. 2,3, and 4 chamber views were done to assess for RWMA's. Modified Simpson's rule using was used to calculate an ejection fraction on a dedicated work Research officer, trade [employer]. The patient received 10 cc of Gadavist . After 10 minutes inversion recovery sequences were used to assess for infiltration and scar tissue. Flow quantification was performed 2 times during this examination with flow quantification performed at the levels of the ascending aorta above the valve, pulmonary artery above the valve. CONTRAST:  10 cc  of Gadavist  FINDINGS: 1. Severely dilated left ventricular size, with LVEDD 67 mm, and LVEDVi 131 mL/m2. Normal left ventricular thickness. Severe decrease in left ventricular systolic function (LVEF =28%). There are no regional wall motion abnormalities, but global hypokinesis. Left ventricular parametric mapping notable for normal T2. ECV elevation in the basal inferior septum, 42%. There is no late gadolinium enhancement in the left ventricular myocardium. Normal resting perfusion. 2. Dilated right ventricular size with RVEDVI 109 mL/m2. Normal right ventricular thickness. Moderately decreased right ventricular  systolic function (RVEF =32%). Relative septal hypokinesis. 3.  Bi-atrial dilation.  IVC dilation, 2.2 cm. 4. Normal size of the aortic root, ascending aorta and pulmonary artery. 5. Valve assessment: Aortic Valve: Tri-leaflet aortic valve. Qualitatively, there is no significant regurgitation. Regurgitant fraction <1 %. Mean gradient 1.6 Mm Hg. Pulmonic Valve: Mild regurgitation.  Regurgitant fraction 10 %. Tricuspid  Valve: Morphology not well visualized. Moderate regurgitation. Regurgitant fraction 28 %. Mitral Valve: Ventricular functional moderate regurgitation. Regurgitant fraction 21 %. 6. Normal pericardium. Small circumferential pericardial effusion without tamponade physiology. 7. Grossly, no extracardiac findings. Recommended dedicated study if concerned for non-cardiac pathology. 8. Breathhold artifacts noted. This decreased the sensitivity of volumetric and flow assessments. IMPRESSION: 1. Dilated left ventricle with severe decrease in function, LVEF 28%. 2. Dilated right ventricle with moderately decreased function, RVEF 32%. 3.  Biatrial dilation. 4.  Moderate mitral and tricuspid regurgitation. Stanly Leavens MD Reference Ranges for Cardiac MRI (Adult Males) Left Ventricle (LV): LVESVi (mL/m) * ?74: ?30 * 75-86: 31-38 * 87-98: 39-44 * ?99: ?45 LV Mass Index (g/m) * ?74: 49-115 * 75-86: - * 87-98: - * ?99: - LVEF (%) * ?74: ?58 * 75-86: - * 87-98: - * ?99: - Right Ventricle (RV): RVESVi (mL/m) * ?87: ?44 RVEF (%) * ?87: ?51 Atria: LA maximum volume index (mL/m) * 34 (16-53): 36 (18-55) RAESVi (mL/m) * 34 (16-53): 39 (18-59) RA maximum volume index (mL/m) * 34 (16-53): 40 (19-61) Reference Ranges for Cardiac MRI (Adult Females) LVESVi (mL/m) * ?61: ?24 * 62-70: 25-30 * 71-79: 31-36 * ?80: ?37 LV Mass Index (g/m) * ?61: 43-95 * 62-70: - * 71-79: - * ?80: - LVEF (%) * ?61: ?60 * 62-70: - * 71-79: - * ?80: - Right Ventricle (RV): RVESVi (mL/m) * ?74: ?32 RVEF (%) * ?74: ?54 Atria: LA maximum volume index (mL/m) * 33 (15-52): 35 (17-54) RAESVi (mL/m) * 33 (15-52): 37 (17-57) RA maximum volume index (mL/m) * 33 (15-52): 38 (18-58) Source: Society for Cardiovascular Magnetic Resonance Armed forces training and education officer) and Journal of Cardiovascular Magnetic Resonance (JCMR), 7976-7975 Electronically Signed   By: Stanly Leavens M.D.   On: 10/11/2023 14:18   MR CARDIAC VELOCITY FLOW MAP Result Date: 10/11/2023 CLINICAL DATA:  Clinical  question of heart failure Study assumes HCT of 34 and BSA of 2.26 m2. EXAM: CARDIAC MRI TECHNIQUE: The patient was scanned on a 1.5 Tesla GE magnet. A dedicated cardiac coil was used. Functional imaging was done using Fiesta sequences. 2,3, and 4 chamber views were done to assess for RWMA's. Modified Simpson's rule using was used to calculate an ejection fraction on a dedicated work Research officer, trade union. The patient received 10 cc of Gadavist . After 10 minutes inversion recovery sequences were used to assess for infiltration and scar tissue. Flow quantification was performed 2 times during this examination with flow quantification performed at the levels of the ascending aorta above the valve, pulmonary artery above the valve. CONTRAST:  10 cc  of Gadavist  FINDINGS: 1. Severely dilated left ventricular size, with LVEDD 67 mm, and LVEDVi 131 mL/m2. Normal left ventricular thickness. Severe decrease in left ventricular systolic function (LVEF =28%). There are no regional wall motion abnormalities, but global hypokinesis. Left ventricular parametric mapping notable for normal T2. ECV elevation in the basal inferior septum, 42%. There is no late gadolinium enhancement in the left ventricular myocardium. Normal resting perfusion. 2. Dilated right ventricular size with RVEDVI 109 mL/m2. Normal right ventricular thickness. Moderately decreased right ventricular  systolic function (RVEF =32%). Relative septal hypokinesis. 3.  Bi-atrial dilation.  IVC dilation, 2.2 cm. 4. Normal size of the aortic root, ascending aorta and pulmonary artery. 5. Valve assessment: Aortic Valve: Tri-leaflet aortic valve. Qualitatively, there is no significant regurgitation. Regurgitant fraction <1 %. Mean gradient 1.6 Mm Hg. Pulmonic Valve: Mild regurgitation.  Regurgitant fraction 10 %. Tricuspid Valve: Morphology not well visualized. Moderate regurgitation. Regurgitant fraction 28 %. Mitral Valve: Ventricular functional moderate  regurgitation. Regurgitant fraction 21 %. 6. Normal pericardium. Small circumferential pericardial effusion without tamponade physiology. 7. Grossly, no extracardiac findings. Recommended dedicated study if concerned for non-cardiac pathology. 8. Breathhold artifacts noted. This decreased the sensitivity of volumetric and flow assessments. IMPRESSION: 1. Dilated left ventricle with severe decrease in function, LVEF 28%. 2. Dilated right ventricle with moderately decreased function, RVEF 32%. 3.  Biatrial dilation. 4.  Moderate mitral and tricuspid regurgitation. Stanly Leavens MD Reference Ranges for Cardiac MRI (Adult Males) Left Ventricle (LV): LVESVi (mL/m) * ?74: ?30 * 75-86: 31-38 * 87-98: 39-44 * ?99: ?45 LV Mass Index (g/m) * ?74: 49-115 * 75-86: - * 87-98: - * ?99: - LVEF (%) * ?74: ?58 * 75-86: - * 87-98: - * ?99: - Right Ventricle (RV): RVESVi (mL/m) * ?87: ?44 RVEF (%) * ?87: ?51 Atria: LA maximum volume index (mL/m) * 34 (16-53): 36 (18-55) RAESVi (mL/m) * 34 (16-53): 39 (18-59) RA maximum volume index (mL/m) * 34 (16-53): 40 (19-61) Reference Ranges for Cardiac MRI (Adult Females) LVESVi (mL/m) * ?61: ?24 * 62-70: 25-30 * 71-79: 31-36 * ?80: ?37 LV Mass Index (g/m) * ?61: 43-95 * 62-70: - * 71-79: - * ?80: - LVEF (%) * ?61: ?60 * 62-70: - * 71-79: - * ?80: - Right Ventricle (RV): RVESVi (mL/m) * ?74: ?32 RVEF (%) * ?74: ?54 Atria: LA maximum volume index (mL/m) * 33 (15-52): 35 (17-54) RAESVi (mL/m) * 33 (15-52): 37 (17-57) RA maximum volume index (mL/m) * 33 (15-52): 38 (18-58) Source: Society for Cardiovascular Magnetic Resonance Armed forces training and education officer) and Journal of Cardiovascular Magnetic Resonance (JCMR), 7976-7975 Electronically Signed   By: Stanly Leavens M.D.   On: 10/11/2023 14:18   CARDIAC CATHETERIZATION Result Date: 10/10/2023   Prox RCA to Mid RCA lesion is 20% stenosed.   The left ventricular ejection fraction is 25-35% by visual estimate. Findings: Ao = 104/72 (87) LV = 117/24 RA =  9 RV  = 39/12 PA = 38/27 (30) PCW = 18 Fick cardiac output/index = 7.0/3.2 Thermo CO/CI = 4.3/1.9 PVR = 1.7 (Fick) 2.8 (TD) Ao sat = 94% PA sat = 65% PAPi = 1.2 Assessment: 1. Minimal non-obstructive CAD 2. Severe NICM EF 20-25% 3. Elevated volume status with low cardiac output Plan/Discussion: Continue diuresis and titration of GDMT. cMRI today Toribio Fuel, MD 12:36 PM  VAS US  LOWER EXTREMITY VENOUS (DVT) Result Date: 10/09/2023  Lower Venous DVT Study Patient Name:  Heidi Sexton [facility]  Date of Exam:   10/09/2023 Medical Rec #: [id number]           Accession #:    [id number] Date of Birth: 14-Mar-1960          Patient Gender: F Patient Age:   40 years Exam Location:  [facility] Procedure:      VAS US  LOWER EXTREMITY VENOUS (DVT) Referring Phys: EKTA PATEL --------------------------------------------------------------------------------  Indications: Edema.  Limitations: Body habitus. Performing Technologist: Elmarie Lindau, RVT  Examination Guidelines: A complete evaluation includes B-mode imaging, spectral Doppler, color  Doppler, and power Doppler as needed of all accessible portions of each vessel. Bilateral testing is considered an integral part of a complete examination. Limited examinations for reoccurring indications may be performed as noted. The reflux portion of the exam is performed with the patient in reverse Trendelenburg.  +---------+---------------+---------+-----------+----------+--------------+ RIGHT    CompressibilityPhasicitySpontaneityPropertiesThrombus Aging +---------+---------------+---------+-----------+----------+--------------+ CFV      Full           Yes      Yes                                 +---------+---------------+---------+-----------+----------+--------------+ SFJ      Full                                                        +---------+---------------+---------+-----------+----------+--------------+ FV Prox  Full                                                         +---------+---------------+---------+-----------+----------+--------------+ FV Mid   Full                                                        +---------+---------------+---------+-----------+----------+--------------+ FV DistalFull                                                        +---------+---------------+---------+-----------+----------+--------------+ PFV      Full                                                        +---------+---------------+---------+-----------+----------+--------------+ POP      Full           Yes      Yes                                 +---------+---------------+---------+-----------+----------+--------------+ PTV      Full                                                        +---------+---------------+---------+-----------+----------+--------------+ PERO     Full                                                        +---------+---------------+---------+-----------+----------+--------------+   +---------+---------------+---------+-----------+----------+--------------+  LEFT     CompressibilityPhasicitySpontaneityPropertiesThrombus Aging +---------+---------------+---------+-----------+----------+--------------+ CFV      Full           Yes      Yes                                 +---------+---------------+---------+-----------+----------+--------------+ SFJ      Full                                                        +---------+---------------+---------+-----------+----------+--------------+ FV Prox  Full                                                        +---------+---------------+---------+-----------+----------+--------------+ FV Mid   Full                                                        +---------+---------------+---------+-----------+----------+--------------+ FV DistalFull                                                         +---------+---------------+---------+-----------+----------+--------------+ PFV      Full                                                        +---------+---------------+---------+-----------+----------+--------------+ POP      Full           Yes      Yes                                 +---------+---------------+---------+-----------+----------+--------------+ PTV      Full                                                        +---------+---------------+---------+-----------+----------+--------------+ PERO     Full                                                        +---------+---------------+---------+-----------+----------+--------------+     Summary: RIGHT: - There is no evidence of deep vein thrombosis in the lower extremity.  - No cystic structure found in the popliteal fossa.  LEFT: - There is no evidence of deep vein thrombosis in the lower extremity.  - No  cystic structure found in the popliteal fossa.  *See table(s) above for measurements and observations. Electronically signed by Debby Robertson on 10/09/2023 at 9:15:31 PM.    Final    ECHOCARDIOGRAM COMPLETE Result Date: 10/09/2023    ECHOCARDIOGRAM REPORT   Patient Name:   Heidi Sexton Date of Exam: 10/09/2023 Medical Rec #:  [id number]          Height:       67.0 in Accession #:    [id number]         Weight:       250.5 lb Date of Birth:  [birth year]         BSA:          2.225 m Patient Age:    62 years           BP:           109/70 mmHg Patient Gender: F                  HR:           105 bpm. Exam Location:  Inpatient Procedure: 2D Echo, Cardiac Doppler, Color Doppler and Intracardiac            Opacification Agent (Both Spectral and Color Flow Doppler were            utilized during procedure). Indications:    Pulmonary Embolus I26.09, CHF I50.9  History:        Patient has no prior history of Echocardiogram examinations.                 Signs/Symptoms:Shortness of Breath.  Sonographer:    Thea Norlander RCS  Referring Phys: TOBIE MODEST, V IMPRESSIONS  1. Left ventricular ejection fraction, by estimation, is 20 to 25%. The left ventricle has severely decreased function. The left ventricle demonstrates global hypokinesis. The left ventricular internal cavity size was moderately dilated. Left ventricular diastolic parameters are indeterminate.  2. Right ventricular systolic function is severely reduced. The right ventricular size is normal. There is moderately elevated pulmonary artery systolic pressure. The estimated right ventricular systolic pressure is 47.9 mmHg.  3. Left atrial size was moderately dilated.  4. Right atrial size was moderately dilated.  5. There is no evidence of cardiac tamponade.  6. The mitral valve is normal in structure. Severe mitral valve regurgitation. No evidence of mitral stenosis.  7. Tricuspid valve regurgitation is severe.  8. The aortic valve is normal in structure. Aortic valve regurgitation is not visualized. No aortic stenosis is present.  9. The inferior vena cava is dilated in size with <50% respiratory variability, suggesting right atrial pressure of 15 mmHg. FINDINGS  Left Ventricle: Left ventricular ejection fraction, by estimation, is 20 to 25%. The left ventricle has severely decreased function. The left ventricle demonstrates global hypokinesis. Definity  contrast agent was given IV to delineate the left ventricular endocardial borders. The left ventricular internal cavity size was moderately dilated. There is no left ventricular hypertrophy. Left ventricular diastolic parameters are indeterminate. Right Ventricle: The right ventricular size is normal. No increase in right ventricular wall thickness. Right ventricular systolic function is severely reduced. There is moderately elevated pulmonary artery systolic pressure. The tricuspid regurgitant velocity is 2.87 m/s, and with an assumed right atrial pressure of 15 mmHg, the estimated right ventricular systolic pressure is 47.9  mmHg. Left Atrium: Left atrial size was moderately dilated. Right Atrium: Right atrial size was moderately dilated. Pericardium: Trivial pericardial effusion is present. The  pericardial effusion is posterior to the left ventricle. There is no evidence of cardiac tamponade. Mitral Valve: The mitral valve is normal in structure. Severe mitral valve regurgitation, with centrally-directed jet. No evidence of mitral valve stenosis. Tricuspid Valve: The tricuspid valve is normal in structure. Tricuspid valve regurgitation is severe. No evidence of tricuspid stenosis. Aortic Valve: The aortic valve is normal in structure. Aortic valve regurgitation is not visualized. No aortic stenosis is present. Aortic valve peak gradient measures 8.8 mmHg. Pulmonic Valve: The pulmonic valve was normal in structure. Pulmonic valve regurgitation is trivial. No evidence of pulmonic stenosis. Aorta: The aortic root is normal in size and structure. Venous: The inferior vena cava is dilated in size with less than 50% respiratory variability, suggesting right atrial pressure of 15 mmHg. IAS/Shunts: No atrial level shunt detected by color flow Doppler.  LEFT VENTRICLE PLAX 2D LVIDd:         6.40 cm   Diastology LVIDs:         5.30 cm   LV e' medial:    6.74 cm/s LV PW:         1.10 cm   LV E/e' medial:  13.9 LV IVS:        1.00 cm   LV e' lateral:   15.10 cm/s LVOT diam:     2.00 cm   LV E/e' lateral: 6.2 LV SV:         46 LV SV Index:   21 LVOT Area:     3.14 cm  RIGHT VENTRICLE             IVC RV S prime:     13.90 cm/s  IVC diam: 3.10 cm TAPSE (M-mode): 2.0 cm LEFT ATRIUM             Index        RIGHT ATRIUM           Index LA diam:        5.20 cm 2.34 cm/m   RA Area:     18.10 cm LA Vol (A2C):   67.5 ml 30.33 ml/m  RA Volume:   51.55 ml  23.17 ml/m LA Vol (A4C):   77.9 ml 35.01 ml/m LA Biplane Vol: 76.3 ml 34.29 ml/m  AORTIC VALVE AV Area (Vmax): 2.29 cm AV Vmax:        148.00 cm/s AV Peak Grad:   8.8 mmHg LVOT Vmax:      108.00  cm/s LVOT Vmean:     68.000 cm/s LVOT VTI:       0.148 m  AORTA Ao Root diam: 3.10 cm Ao Asc diam:  3.30 cm MITRAL VALVE               TRICUSPID VALVE MV Area (PHT): 5.88 cm    TR Peak grad:   32.9 mmHg MV Decel Time: 129 msec    TR Vmax:        287.00 cm/s MV E velocity: 93.80 cm/s MV A velocity: 52.20 cm/s  SHUNTS MV E/A ratio:  1.80        Systemic VTI:  0.15 m                            Systemic Diam: 2.00 cm Toribio Fuel MD Electronically signed by Toribio Fuel MD Signature Date/Time: 10/09/2023/1:35:18 PM    Final    US  Abdomen Limited RUQ (LIVER/GB) Result Date: 10/09/2023 CLINICAL DATA:  Transaminitis  EXAM: ULTRASOUND ABDOMEN LIMITED RIGHT UPPER QUADRANT COMPARISON:  CT 10/12/2013 FINDINGS: Gallbladder: No gallstones or wall thickening visualized. No sonographic Murphy sign noted by sonographer. Common bile duct: Diameter: 2.6 mm, normal. Liver: No focal lesion identified. Within normal limits in parenchymal echogenicity. Portal vein is patent on color Doppler imaging with normal direction of blood flow towards the liver. Other: None. IMPRESSION: Normal right upper quadrant ultrasound. Electronically Signed   By: Oneil Officer M.D.   On: 10/09/2023 10:00   CT Angio Chest PE W and/or Wo Contrast Result Date: 10/08/2023 CLINICAL DATA:  Shortness of breath and chest pain EXAM: CT ANGIOGRAPHY CHEST WITH CONTRAST TECHNIQUE: Multidetector CT imaging of the chest was performed using the standard protocol during bolus administration of intravenous contrast. Multiplanar CT image reconstructions and MIPs were obtained to evaluate the vascular anatomy. RADIATION DOSE REDUCTION: This exam was performed according to the departmental dose-optimization program which includes automated exposure control, adjustment of the mA and/or kV according to patient size and/or use of iterative reconstruction technique. CONTRAST:  75mL OMNIPAQUE  IOHEXOL  350 MG/ML SOLN COMPARISON:  Chest x-ray 10/07/2023 FINDINGS:  Cardiovascular: Slightly degraded by motion. Positive for small acute right lower lobe subsegmental embolus, coronal series 9 image 46 and sagittal series 10 image 65. No other discrete filling defects are visualized. Nonaneurysmal aorta. Cardiomegaly. No sizable pericardial effusion Mediastinum/Nodes: Patent trachea. No suspicious thyroid mass. Subcentimeter mediastinal lymph nodes. Esophagus within normal limits. Lungs/Pleura: No sizable pleural effusion. Wedge shaped and somewhat bandlike density in the left upper lobe with associated minimal bronchiectasis, favored to represent focal scarring. Mild smooth septal thickening most evident at the bases, could be due to mild edema. Minimal subpleural scarring in the left upper lobe. Upper Abdomen: Reflux of contrast into the hepatic veins consistent with elevated right heart pressure. No acute finding. Musculoskeletal: Right breast implant. No acute osseous abnormality. Review of the MIP images confirms the above findings. IMPRESSION: 1. Slightly degraded by motion. Findings suspicious for small acute right lower lobe subsegmental embolus. 2. Cardiomegaly with reflux of contrast into the hepatic veins consistent with elevated right heart pressure. Mild smooth septal thickening most evident at the bases, could be due to mild edema. 3. Wedge shaped and somewhat bandlike density in the left upper lobe with associated minimal bronchiectasis, favored to represent focal scarring. Critical Value/emergent results were called by telephone at the time of interpretation on 10/08/2023 at 1:40 am to provider VICENTA ABLE , who verbally acknowledged these results. Electronically Signed   By: Luke Bun M.D.   On: 10/08/2023 01:40    Results are pending, will review when available.  Previous records (including but not limited to H&P, progress notes, nursing notes, TOC management) were reviewed in assessment of this patient.  Labs: CBC: Recent Labs  Lab 10/08/23 0020  10/09/23 0437 10/10/23 0249 10/10/23 1051 10/10/23 1056 10/11/23 0215 10/12/23 0248  WBC 6.8 7.8 7.9  --   --  6.5 5.7  NEUTROABS 4.1  --   --   --   --   --   --   HGB 10.2* 10.6* 10.7* 8.2* 11.2*  10.5* 11.1* 12.1  HCT 32.2* 34.0* 34.0* 24.0* 33.0*  31.0* 34.5* 39.0  MCV 87.0 86.5 85.9  --   --  85.4 86.7  PLT 269 260 306  --   --  314 318   Basic Metabolic Panel: Recent Labs  Lab 10/08/23 0020 10/08/23 1714 10/09/23 0437 10/10/23 0249 10/10/23 1051 10/10/23 1056 10/11/23 0215 10/12/23 0248  NA 138  --  140 137 148* 138  134* 137 134*  K 3.9  --  3.6 3.7 2.6* 3.6  3.2* 3.9 3.9  CL 106  --  101 100  --   --  100 97*  CO2 20*  --  25 25  --   --  26 25  GLUCOSE 115*  --  99 110*  --   --  109* 102*  BUN 17  --  10 15  --   --  17 21*  CREATININE 0.97  --  0.93 0.89  --   --  0.94 1.15*  CALCIUM  8.8*  --  8.6* 9.2  --   --  9.1 9.4  MG  --  1.8  --   --   --   --  2.0 2.2   Liver Function Tests: Recent Labs  Lab 10/08/23 1714 10/09/23 0437 10/10/23 0249 10/11/23 0215  AST 81* 58* 34 25  ALT 173* 148* 108* 79*  ALKPHOS 107 96 93 90  BILITOT 2.1* 2.4* 1.2 1.1  PROT 6.5 6.3* 6.6 5.5*  ALBUMIN 3.3* 3.2* 3.2* 3.1*   CBG: No results for input(s): GLUCAP in the last 168 hours.  Scheduled Meds:  apixaban   10 mg Oral BID   Followed by   NOREEN ON 10/17/2023] apixaban   5 mg Oral BID   bisacodyl   10 mg Oral Once   digoxin   0.125 mg Oral Daily   empagliflozin   10 mg Oral Daily   fentaNYL  (SUBLIMAZE ) injection  50 mcg Intravenous Once   gabapentin   300 mg Oral TID   losartan   25 mg Oral Daily   multivitamin with minerals  1 tablet Oral Daily   sodium chloride  flush  3 mL Intravenous Q12H   sodium chloride  flush  3 mL Intravenous Q12H   sodium chloride  flush  3 mL Intravenous Q12H   spironolactone   12.5 mg Oral Daily   Continuous Infusions:   PRN Meds:.acetaminophen  **OR** acetaminophen , bisacodyl , cyclobenzaprine , hydrALAZINE , ondansetron  **OR**  ondansetron  (ZOFRAN ) IV, polyethylene glycol, sodium chloride  flush, sodium chloride  flush  Family Communication: None at bedside  Disposition: Status is: Inpatient Remains inpatient appropriate because: HFrEF     Time spent: 31 minutes  Length of inpatient stay: 3 days  Author: Carliss LELON Canales, DO 10/12/2023 1:17 PM  For on call review [email or web address].

## 2023-10-13 LAB — LIPOPROTEIN A (LPA): Lipoprotein (a): 70 nmol/L — ABNORMAL HIGH (ref <75.0)

## 2023-10-17 ENCOUNTER — Telehealth (HOSPITAL_COMMUNITY): Payer: Self-pay | Admitting: *Deleted

## 2023-10-17 NOTE — Progress Notes (Signed)
 [facility] CONSULT NOTE  Primary Care: Ransom Other, MD HF Cardiologist: Dr. Cherrie  HPI: Heidi Sexton is a 63 y.o.Heidi Sexton female with hx of bilateral breast cancer s/p mastectomy and chemo (adriamycin and cytoxan 96', taxotere  carboplatin  and taxol  15'-16'), s/p bilateral salpingo-oophorectomy, newly diagnosed with systolic heart failure and PE.   Admitted 8/25 with new systolic heart failure and PE. Echo showed EF 20-25%, LV with GHK, RV severely reduced, LA/RA mod dilated, severe MR/TR. CT showed RLL subsegmental embolus, started on AC. Diuresed with IV lasix . Underwent R/LHC showed minimal nonobstructive CAD, elevated volume with low CO. cMRI showed LVEF 28% RVEF 32% No LGE. GDMT titrated and she was discharged home, weight 238 lbs.  Today she returns for HF follow up. Overall feeling fine, continues to do water  aerobics 4x/week without dyspnea.  Denies  palpitations, abnormal bleeding, CP, dizziness, edema, or PND/Orthopnea. Appetite ok. Weight at home 228 pounds. Taking all medications. Retired, lives with husband. Took Lasix  once since discharge.  Family Hx: no family history of hear disease Social Hx: quit smoking 30 years ago; smoked THC until 05/2023  Cardiac Studies - cMRI (8/25): LVEF 28% RVEF 32% No LGE, moderate TR and MR  - R/LHC (8/25): mild, non-obs CAD; RA 9, PA 38/27 (30), PCWP 18, CO/CI (Fick) 7.0/3.2, PVR 1.7 WU, PAPi 1.2  - Echo (8/25): EF 20-25%, RV severely reduced, severe MR and TR    Past Medical History:  Diagnosis Date   Anxiety    h/o panic attacks - relative to migranie headaches -pt denies? anxiety related to Chemotherapy   Breast cancer [facility])    both breasts 4'16- Cancer rt.breast '2015-with chemo last Tx. 4'16-Dr. Livesay follows.   Family history of anesthesia complication    daughter got nauseated also   H/O seasonal allergies    OTC   Heart murmur    during pregnancy only- then resolved    Migraine    maybe 2/yr  (11/16/2013)   PONV (postoperative nausea and vomiting)    Wears glasses     Current Outpatient Medications  Medication Sig Dispense Refill   APIXABAN  (ELIQUIS ) VTE STARTER PACK (10MG  AND 5MG ) Take as directed on package: start with two-5mg  tablets twice daily for 7 days. On day 8, switch to one-5mg  tablet twice daily. 222 each 0   atorvastatin  (LIPITOR) 40 MG tablet Take 1 tablet (40 mg total) by mouth daily. 30 tablet 0   Cholecalciferol (VITAMIN D) 2000 UNITS tablet Take 2,000 Units by mouth daily.     digoxin  (LANOXIN ) 0.125 MG tablet Take 1 tablet (0.125 mg total) by mouth daily. 30 tablet 0   empagliflozin  (JARDIANCE ) 10 MG TABS tablet Take 1 tablet (10 mg total) by mouth daily. 30 tablet 0   ferrous sulfate  325 (65 FE) MG EC tablet Take 1 tablet (325 mg total) by mouth daily with breakfast. 30 tablet 0   furosemide  (LASIX ) 40 MG tablet Take 1 tablet (40 mg total) by mouth daily as needed for fluid or edema. 30 tablet 0   gabapentin  (NEURONTIN ) 300 MG capsule TAKE 1 CAPSULE(300 MG) BY MOUTH THREE TIMES DAILY 90 capsule 2   losartan  (COZAAR ) 25 MG tablet Take 1 tablet (25 mg total) by mouth daily. 30 tablet 0   spironolactone  (ALDACTONE ) 25 MG tablet Take 1 tablet (25 mg total) by mouth daily. 30 tablet 0   loratadine (CLARITIN) 10 MG tablet Take 10 mg by mouth daily as needed for allergies. (Patient not taking: Reported on  10/21/2023)     No current facility-administered medications for this encounter.    Allergies  Allergen Reactions   Adhesive [Tape] Hives   Aleve [Naproxen] Itching   Advil [Ibuprofen]     Itchy red face   Nsaids       Social History   Socioeconomic History   Marital status: Married    Spouse name: Not on file   Number of children: Not on file   Years of education: Not on file   Highest education level: Not on file  Occupational History   Not on file  Tobacco Use   Smoking status: Former    Current packs/day: 0.00    Average packs/day: 0.1 packs/day  for 15.0 years (1.5 ttl pk-yrs)    Types: Cigarettes    Start date: 11/12/1978    Quit date: 11/11/1993    Years since quitting: 29.9   Smokeless tobacco: Never  Substance and Sexual Activity   Alcohol use: Yes    Comment: 11/16/2013 glass of wine maybe twice/month, if that   Drug use: No   Sexual activity: Not Currently  Other Topics Concern   Not on file  Social History Narrative   Not on file   Social Drivers of Health   Financial Resource Strain: Not on file  Food Insecurity: No Food Insecurity (10/08/2023)   Hunger Vital Sign    Worried About Running Out of Food in the Last Year: Never true    Ran Out of Food in the Last Year: Never true  Transportation Needs: No Transportation Needs (10/08/2023)   PRAPARE - Administrator, Civil Service (Medical): No    Lack of Transportation (Non-Medical): No  Physical Activity: Not on file  Stress: Not on file  Social Connections: Not on file  Intimate Partner Violence: Not At Risk (10/08/2023)   Humiliation, Afraid, Rape, and Kick questionnaire    Fear of Current or Ex-Partner: No    Emotionally Abused: No    Physically Abused: No    Sexually Abused: No   Family History  Problem Relation Age of Onset   Liver cancer Mother    Wt Readings from Last 3 Encounters:  10/21/23 104.9 kg (231 lb 4.2 oz)  10/12/23 108.2 kg (238 lb 8.6 oz)  09/18/20 107.7 kg (237 lb 6.4 oz)   BP 122/78   Pulse 91   Ht 5' 7 (1.702 m)   Wt 104.9 kg (231 lb 4.2 oz)   SpO2 100%   BMI 36.22 kg/m   PHYSICAL EXAM: General:  NAD. No resp difficulty, walked into clinic HEENT: Normal Neck: Supple. No JVD. Cor: Regular rate & rhythm. No rubs, gallops, 2/6 TR Lungs: Clear Abdomen: Soft, nontender, nondistended.  Extremities: No cyanosis, clubbing, rash, edema Neuro: Alert & oriented x 3, moves all 4 extremities w/o difficulty. Affect pleasant.  ECG (personally reviewed): NSR 78 bpm  ASSESSMENT & PLAN: Chronic systolic heart failure with  severe RV dysfunction - Suspect chemo induced CM, last chemo was in 2016.   - Echo 8/25: EF 20-25%, LV with GHK, RV severely reduced, LA/RA mod dilated, severe MR/TR - Cath 8/25: Minimal non-obs CAD (20%RCA), elevated volume and low CI (thermo) 1.9 - cMRI 8/25: LVEF 28% RVEF 32% No LGE - NYHA I, volume looks good today. - Stop losartan . - Start Entresto  24/26 mg bid - Continue Lasix  40 mg PRN - Continue spiro 25 mg daily - Continue digoxin  0.125 mcg/daily - Continue Jardiance  10 mg daily - No b-blocker  yet - Labs today - Repeat echo in 3 months. - Discussed CR, but she is already active with her water  aerobics, so will defer.   PE - CT consistent with RLL subsegmental embolus - Continue Eliquis . No bleeding issues - CBC today   Valvular heart disease - Severe MR and TR on echo this admission - HF optimization as above - Repeat echo in 3 months   4. Hx bilateral breast cancer - s/p mastectomy and BSO - s/p adriamycin and cytoxan 96', taxotere  carboplatin  and taxol  '15-'16 - Followed by Dr. Odean   5. CAD - LHC 8/25 with minimal CAD - No chest pain - No asa with AC - Continue atorvastatin  40 mg daily - Check LFTs/lipids in 6-8 weeks   Follow up in 3 weeks with PharmD, 6 weeks with APP and 3 months with Dr. Cherrie + echo  Zion, FNP-BC 10/21/23

## 2023-10-17 NOTE — Telephone Encounter (Signed)
 Called to confirm/remind patient of their appointment at the [facility] on 10/21/23.       Appointment:              [x] Confirmed             [] Left mess              [] No answer/No voice mail             [] Phone not in service   Patient reminded to bring all medications and/or complete list.   Confirmed patient has transportation. Gave directions, instructed to utilize valet parking.

## 2023-10-21 ENCOUNTER — Ambulatory Visit (HOSPITAL_COMMUNITY): Payer: Self-pay | Admitting: Family Medicine

## 2023-10-21 ENCOUNTER — Encounter: Payer: Self-pay | Admitting: Oncology

## 2023-10-21 ENCOUNTER — Encounter (HOSPITAL_COMMUNITY): Payer: Self-pay

## 2023-10-21 ENCOUNTER — Telehealth (HOSPITAL_COMMUNITY): Payer: Self-pay | Admitting: *Deleted

## 2023-10-21 ENCOUNTER — Ambulatory Visit (HOSPITAL_COMMUNITY)
Admission: RE | Admit: 2023-10-21 | Discharge: 2023-10-21 | Disposition: A | Discharge status: Home / Self Care | Source: Ambulatory Visit | Attending: Family Medicine | Admitting: Family Medicine

## 2023-10-21 ENCOUNTER — Other Ambulatory Visit (HOSPITAL_COMMUNITY): Payer: Self-pay

## 2023-10-21 ENCOUNTER — Telehealth (HOSPITAL_COMMUNITY): Payer: Self-pay

## 2023-10-21 VITALS — BP 122/78 | HR 91 | Ht 67.0 in | Wt 231.3 lb

## 2023-10-21 DIAGNOSIS — I5022 Chronic systolic (congestive) heart failure: Secondary | ICD-10-CM

## 2023-10-21 DIAGNOSIS — Z9221 Personal history of antineoplastic chemotherapy: Secondary | ICD-10-CM | POA: Insufficient documentation

## 2023-10-21 DIAGNOSIS — I2699 Other pulmonary embolism without acute cor pulmonale: Secondary | ICD-10-CM | POA: Insufficient documentation

## 2023-10-21 DIAGNOSIS — I081 Rheumatic disorders of both mitral and tricuspid valves: Secondary | ICD-10-CM | POA: Diagnosis not present

## 2023-10-21 DIAGNOSIS — I38 Endocarditis, valve unspecified: Secondary | ICD-10-CM | POA: Diagnosis not present

## 2023-10-21 DIAGNOSIS — Z9013 Acquired absence of bilateral breasts and nipples: Secondary | ICD-10-CM | POA: Insufficient documentation

## 2023-10-21 DIAGNOSIS — I2510 Atherosclerotic heart disease of native coronary artery without angina pectoris: Secondary | ICD-10-CM | POA: Diagnosis not present

## 2023-10-21 DIAGNOSIS — Z90722 Acquired absence of ovaries, bilateral: Secondary | ICD-10-CM | POA: Diagnosis not present

## 2023-10-21 DIAGNOSIS — Z7901 Long term (current) use of anticoagulants: Secondary | ICD-10-CM | POA: Diagnosis not present

## 2023-10-21 DIAGNOSIS — Z87891 Personal history of nicotine dependence: Secondary | ICD-10-CM | POA: Insufficient documentation

## 2023-10-21 DIAGNOSIS — Z79899 Other long term (current) drug therapy: Secondary | ICD-10-CM | POA: Insufficient documentation

## 2023-10-21 DIAGNOSIS — Z853 Personal history of malignant neoplasm of breast: Secondary | ICD-10-CM | POA: Diagnosis not present

## 2023-10-21 LAB — CBC
HCT: 41.1 % (ref 36.0–46.0)
Hemoglobin: 12.8 g/dL (ref 12.0–15.0)
MCH: 26.9 pg (ref 26.0–34.0)
MCHC: 31.1 g/dL (ref 30.0–36.0)
MCV: 86.5 fL (ref 80.0–100.0)
Platelets: 384 K/uL (ref 150–400)
RBC: 4.75 MIL/uL (ref 3.87–5.11)
RDW: 15 % (ref 11.5–15.5)
WBC: 4.1 K/uL (ref 4.0–10.5)
nRBC: 0 % (ref 0.0–0.2)

## 2023-10-21 LAB — BASIC METABOLIC PANEL WITH GFR
Anion gap: 11 (ref 5–15)
BUN: 16 mg/dL (ref 6–20)
CO2: 21 mmol/L — ABNORMAL LOW (ref 22–32)
Calcium: 9.7 mg/dL (ref 8.9–10.3)
Chloride: 103 mmol/L (ref 98–111)
Creatinine, Ser: 0.99 mg/dL (ref 0.44–1.00)
GFR, Estimated: >60 mL/min (ref >60)
Glucose, Bld: 137 mg/dL — ABNORMAL HIGH (ref 70–99)
Potassium: 4 mmol/L (ref 3.5–5.1)
Sodium: 135 mmol/L (ref 135–145)

## 2023-10-21 LAB — BRAIN NATRIURETIC PEPTIDE: B Natriuretic Peptide: 88.9 pg/mL (ref 0.0–100.0)

## 2023-10-21 LAB — DIGOXIN LEVEL: Digoxin Level: 1.1 ng/mL (ref 0.8–2.0)

## 2023-10-21 MED ORDER — SACUBITRIL-VALSARTAN 24-26 MG PO TABS: 1.0000 | ORAL_TABLET | Freq: Two times a day (BID) | ORAL | 11 refills | Status: DC

## 2023-10-21 NOTE — Patient Instructions (Addendum)
 Stop Losartan . Start Entresto  24/26 mg twice daily. Rx sent. Labs today - will call you if abnormal. Return to Heart Failure Pharmacy Clinic in 3 weeks - see below. Return to Heart Failure APP Clinic in 6 weeks - see below. Return to see Dr. Cherrie with an echo in 3 months. PLEASE CALL [phone] IN JANUARY TO SCHEDULE THIS APPOINTMENT. Please call us  at [phone] option 2 if any questions or concerns prior to your next visit.

## 2023-10-21 NOTE — Telephone Encounter (Signed)
 Called patient per Harlene Gainer, NP with following lab results and instructions:  Dig level elevated but this was not a trough. Arrange a digoxin  trough at next appt 11/11/23.  Patient verbalized understanding of same. She will hold her Digoxin  morning of her appointment on 11/11/23. Digoxin  order placed, request placed on her appointment note.

## 2023-10-21 NOTE — Telephone Encounter (Signed)
 Advanced [facility] Failure Patient Advocate Encounter  Prior authorization for Sacubitril -Valsartan  24-26 MG has been submitted and approved. Test billing returns $0 for 30 day supply. This plan limits 30 day fill.  Key: [id number] Effective: 10/21/2023 to 10/20/2024  Rachel DEL, CPhT Rx Patient Advocate Phone: [phone]

## 2023-10-22 ENCOUNTER — Other Ambulatory Visit (HOSPITAL_COMMUNITY): Payer: Self-pay

## 2023-10-28 NOTE — Progress Notes (Signed)
 [facility] Note   Primary Care: Ransom Other, MD HF Cardiologist: Dr. Cherrie  HPI:  Heidi Sexton is a 63 y.o.[name] female with hx of bilateral breast cancer s/p mastectomy and chemo (adriamycin and cytoxan 96', taxotere  carboplatin  and taxol  15'-16'), s/p bilateral salpingo-oophorectomy, newly diagnosed with systolic heart failure and PE.   Admitted 09/2023 with new systolic heart failure and PE. Echo showed EF 20-25%, LV with GHK, RV severely reduced, LA/RA mod dilated, severe MR/TR. CT showed RLL subsegmental embolus, started on AC. Diuresed with IV Lasix . Underwent R/LHC showed minimal nonobstructive CAD, elevated volume with low CO. cMRI showed LVEF 28% RVEF 32% No LGE. GDMT titrated and she was discharged home, weight 238 lbs.   Presented to [facility] Clinic for HF follow up 10/21/23. Overall was feeling fine, continued to do water  aerobics 4x/week without dyspnea.  Denied palpitations, abnormal bleeding, CP, dizziness, edema, or PND/Orthopnea. Appetite was ok. Weight at home 228 pounds. Reported taking all medications. Retired, lives with husband. Had only taken Lasix  once since discharge.  Today she returns to HF clinic for pharmacist medication titration. At last visit with APP, losartan  was stopped and Entresto  24/26 mg BID was initiated. Overall she is feeling well today. She is in the middle of remodeling her house, which is stressful and tiring, but otherwise doing well. Has some chest pain/tightness related to prior scar tissue, but this is not new. No palpitations. No SOB/DOE. Does water  aerobics to stay active. Weight at home has been stable, 227-229 lbs. She has only needed one dose of Lasix  for a weight gain of 3 lbs overnight. No LEE, PND or orthopnea. Has been trying to follow a low salt diet, but does note this has been difficult. Congratulated her on her efforts. Accidentally has only been taking Entresto  once a day rather than twice daily. Taking all other  medications as prescribed. She held digoxin  as instructed before today's visit so a true trough could be obtained.    HF Medications: Entresto  24/26 mg BID Spironolactone  25 mg daily Jardiance  10 mg daily Digoxin  0.125 mg daily Lasix  40 mg PRN  Has the patient been experiencing any side effects to the medications prescribed?  no  Does the patient have any problems obtaining medications due to transportation or finances?   No; [employer] Nurse, learning disability  Understanding of regimen: good Understanding of indications: good Potential of compliance: good Patient understands to avoid NSAIDs. Patient understands to avoid decongestants.    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   - Echo 09/2023: EF 20-25%, LV with GHK, RV severely reduced, LA/RA mod dilated, severe MR/TR - Cath 09/2023: Minimal non-obs CAD (20% RCA), elevated volume and low CI (thermo) 1.9 - cMRI 09/2023: LVEF 28% RVEF 32% No LGE - NYHA I, volume looks good today. - BMET today pending - Continue Lasix  40 mg PRN - Start carvedilol  3.125 mg BID - Has only been taking Entresto  24/26 mg once daily (did not realize it was dosed twice daily. Instructed to increase to Entresto  24/26 mg BID.  - Continue spironolactone  25 mg daily - Continue Jardiance  10 mg daily - Continue digoxin  0.125 mg daily. Digoxin  level today  pending, she held dose this morning as instructed so level will be a true trough.  - Repeat echo scheduled for 01/30/24 - Previously discussed CR, but she was already active with her water  aerobics, so was deferred.   PE - CT consistent with RLL subsegmental embolus - Continue Eliquis . No bleeding  issues. Refills sent today   Valvular heart disease - Severe MR and TR on echo this admission - HF optimization as above - Repeat echo scheduled for 01/30/24   4. Hx bilateral breast cancer - s/p mastectomy and BSO - s/p adriamycin and cytoxan 96', taxotere  carboplatin  and taxol  '15-'16 - Followed by Dr. Odean   5. CAD - LHC 09/2023 with minimal CAD - No chest pain - No asa with AC - Continue atorvastatin  40 mg daily - Check LFTs/lipids in 6-8 weeks  Follow up in 3 weeks with APP Clinic and 3 months with Dr. Cherrie + echo.    Tinnie Redman, PharmD, BCPS, BCCP, CPP Heart Failure Clinic Pharmacist [phone]

## 2023-11-11 ENCOUNTER — Ambulatory Visit (HOSPITAL_COMMUNITY)
Admission: RE | Admit: 2023-11-11 | Discharge: 2023-11-11 | Disposition: A | Discharge status: Home / Self Care | Source: Ambulatory Visit | Attending: Cardiology | Admitting: Cardiology

## 2023-11-11 ENCOUNTER — Ambulatory Visit (HOSPITAL_COMMUNITY): Payer: Self-pay | Admitting: Family Medicine

## 2023-11-11 VITALS — BP 118/82 | HR 92 | Wt 231.0 lb

## 2023-11-11 DIAGNOSIS — Z79899 Other long term (current) drug therapy: Secondary | ICD-10-CM | POA: Insufficient documentation

## 2023-11-11 DIAGNOSIS — Z90722 Acquired absence of ovaries, bilateral: Secondary | ICD-10-CM | POA: Insufficient documentation

## 2023-11-11 DIAGNOSIS — I2510 Atherosclerotic heart disease of native coronary artery without angina pectoris: Secondary | ICD-10-CM | POA: Diagnosis not present

## 2023-11-11 DIAGNOSIS — I5022 Chronic systolic (congestive) heart failure: Secondary | ICD-10-CM | POA: Diagnosis not present

## 2023-11-11 DIAGNOSIS — Z853 Personal history of malignant neoplasm of breast: Secondary | ICD-10-CM | POA: Insufficient documentation

## 2023-11-11 DIAGNOSIS — Z9079 Acquired absence of other genital organ(s): Secondary | ICD-10-CM | POA: Insufficient documentation

## 2023-11-11 DIAGNOSIS — I081 Rheumatic disorders of both mitral and tricuspid valves: Secondary | ICD-10-CM | POA: Insufficient documentation

## 2023-11-11 DIAGNOSIS — Z9013 Acquired absence of bilateral breasts and nipples: Secondary | ICD-10-CM | POA: Insufficient documentation

## 2023-11-11 LAB — DIGOXIN LEVEL: Digoxin Level: <0.6 ng/mL — ABNORMAL LOW (ref 0.8–2.0)

## 2023-11-11 LAB — BASIC METABOLIC PANEL WITH GFR
Anion gap: 12 (ref 5–15)
BUN: 15 mg/dL (ref 6–20)
CO2: 21 mmol/L — ABNORMAL LOW (ref 22–32)
Calcium: 9.3 mg/dL (ref 8.9–10.3)
Chloride: 106 mmol/L (ref 98–111)
Creatinine, Ser: 0.9 mg/dL (ref 0.44–1.00)
GFR, Estimated: >60 mL/min (ref >60)
Glucose, Bld: 139 mg/dL — ABNORMAL HIGH (ref 70–99)
Potassium: 4.3 mmol/L (ref 3.5–5.1)
Sodium: 139 mmol/L (ref 135–145)

## 2023-11-11 MED ORDER — SPIRONOLACTONE 25 MG PO TABS: 25.0000 mg | ORAL_TABLET | Freq: Every day | ORAL | 5 refills | Status: DC

## 2023-11-11 MED ORDER — DIGOXIN 125 MCG PO TABS: 0.1250 mg | ORAL_TABLET | Freq: Every day | ORAL | 5 refills | Status: DC

## 2023-11-11 MED ORDER — APIXABAN 5 MG PO TABS: 5.0000 mg | ORAL_TABLET | Freq: Two times a day (BID) | ORAL | 5 refills | Status: DC

## 2023-11-11 MED ORDER — EMPAGLIFLOZIN 10 MG PO TABS: 10.0000 mg | ORAL_TABLET | Freq: Every day | ORAL | 5 refills | Status: DC

## 2023-11-11 MED ORDER — FERROUS SULFATE 325 (65 FE) MG PO TBEC: 325.0000 mg | DELAYED_RELEASE_TABLET | Freq: Every day | ORAL | 11 refills | Status: AC

## 2023-11-11 MED ORDER — CARVEDILOL 3.125 MG PO TABS
3.1250 mg | ORAL_TABLET | Freq: Two times a day (BID) | ORAL | 5 refills | Status: DC
Start: 2023-11-11 — End: 2023-12-02

## 2023-11-11 MED ORDER — ATORVASTATIN CALCIUM 40 MG PO TABS: 40.0000 mg | ORAL_TABLET | Freq: Every day | ORAL | 5 refills | Status: DC

## 2023-11-11 NOTE — Patient Instructions (Signed)
 It was a pleasure seeing you today!  MEDICATIONS: -We are changing your medications today -Start carvedilol  3.125 mg (1 tablet) twice daily -Start taking Entresto  twice daily instead of just once a day. -Call if you have questions about your medications.  LABS: -We will call you if your labs need attention.  NEXT APPOINTMENT: Return to clinic in 3 weeks with APP Clinic.  In general, to take care of your heart failure: -Limit your fluid intake to 2 Liters (half-gallon) per day.   -Limit your salt intake to ideally 2-3 grams (2000-3000 mg) per day. -Weigh yourself daily and record, and bring that weight diary to your next appointment.  (Weight gain of 2-3 pounds in 1 day typically means fluid weight.) -The medications for your heart are to help your heart and help you live longer.   -Please contact us  before stopping any of your heart medications.  Call the clinic at [phone] with questions or to reschedule future appointments.

## 2023-11-26 NOTE — Progress Notes (Incomplete)
 ADVANCED HF CLINIC CONSULT NOTE  Primary Care: Heidi Other, MD HF Cardiologist: Dr. Cherrie  HPI: Heidi Sexton is a 63 y.o.Heidi Sexton female with hx of bilateral breast cancer s/p mastectomy and chemo (adriamycin and cytoxan 96', taxotere  carboplatin  and taxol  15'-16'), s/p bilateral salpingo-oophorectomy, newly diagnosed with systolic heart failure and PE.   Admitted 8/25 with new systolic heart failure and PE. Echo showed EF 20-25%, LV with GHK, RV severely reduced, LA/RA mod dilated, severe MR/TR. CT showed RLL subsegmental embolus, started on AC. Diuresed with IV lasix . Underwent R/LHC showed minimal nonobstructive CAD, elevated volume with low CO. cMRI showed LVEF 28% RVEF 32% No LGE. GDMT titrated and she was discharged home, weight 238 lbs.  Today she returns for HF follow up. Overall feeling fine, continues to do water  aerobics 4x/week without dyspnea.  Denies  palpitations, abnormal bleeding, CP, dizziness, edema, or PND/Orthopnea. Appetite ok. Weight at home 228 pounds. Taking all medications. Retired, lives with husband. Took Lasix  once since discharge.  Family Hx: no family history of hear disease Social Hx: quit smoking 30 years ago; smoked THC until 05/2023  Cardiac Studies - cMRI (8/25): LVEF 28% RVEF 32% No LGE, moderate TR and MR  - R/LHC (8/25): mild, non-obs CAD; RA 9, PA 38/27 (30), PCWP 18, CO/CI (Fick) 7.0/3.2, PVR 1.7 WU, PAPi 1.2  - Echo (8/25): EF 20-25%, RV severely reduced, severe MR and TR    Past Medical History:  Diagnosis Date   Anxiety    h/o panic attacks - relative to migranie headaches -pt denies? anxiety related to Chemotherapy   Breast cancer [facility])    both breasts 4'16- Cancer rt.breast '2015-with chemo last Tx. 4'16-Dr. Livesay follows.   Family history of anesthesia complication    daughter got nauseated also   H/O seasonal allergies    OTC   Heart murmur    during pregnancy only- then resolved    Migraine    maybe 2/yr  (11/16/2013)   PONV (postoperative nausea and vomiting)    Wears glasses     Current Outpatient Medications  Medication Sig Dispense Refill   apixaban  (ELIQUIS ) 5 MG TABS tablet Take 1 tablet (5 mg total) by mouth 2 (two) times daily. 60 tablet 5   atorvastatin  (LIPITOR) 40 MG tablet Take 1 tablet (40 mg total) by mouth daily. 30 tablet 5   carvedilol  (COREG ) 3.125 MG tablet Take 1 tablet (3.125 mg total) by mouth 2 (two) times daily. 60 tablet 5   Cholecalciferol (VITAMIN D) 2000 UNITS tablet Take 2,000 Units by mouth daily.     digoxin  (LANOXIN ) 0.125 MG tablet Take 1 tablet (0.125 mg total) by mouth daily. 30 tablet 5   empagliflozin  (JARDIANCE ) 10 MG TABS tablet Take 1 tablet (10 mg total) by mouth daily. 30 tablet 5   ferrous sulfate  325 (65 FE) MG EC tablet Take 1 tablet (325 mg total) by mouth daily with breakfast. 30 tablet 11   furosemide  (LASIX ) 40 MG tablet Take 1 tablet (40 mg total) by mouth daily as needed for fluid or edema. 30 tablet 0   gabapentin  (NEURONTIN ) 300 MG capsule TAKE 1 CAPSULE(300 MG) BY MOUTH THREE TIMES DAILY 90 capsule 2   loratadine (CLARITIN) 10 MG tablet Take 10 mg by mouth daily as needed for allergies.     sacubitril -valsartan  (ENTRESTO ) 24-26 MG Take 1 tablet by mouth 2 (two) times daily. 60 tablet 11   spironolactone  (ALDACTONE ) 25 MG tablet Take 1 tablet (25 mg total) by  mouth daily. 30 tablet 5   No current facility-administered medications for this visit.    Allergies  Allergen Reactions   Adhesive [Tape] Hives   Aleve [Naproxen] Itching   Advil [Ibuprofen]     Itchy red face   Nsaids       Social History   Socioeconomic History   Marital status: Married    Spouse name: Not on file   Number of children: Not on file   Years of education: Not on file   Highest education level: Not on file  Occupational History   Not on file  Tobacco Use   Smoking status: Former    Current packs/day: 0.00    Average packs/day: 0.1 packs/day for 15.0  years (1.5 ttl pk-yrs)    Types: Cigarettes    Start date: 11/12/1978    Quit date: 11/11/1993    Years since quitting: 30.0   Smokeless tobacco: Never  Substance and Sexual Activity   Alcohol use: Yes    Comment: 11/16/2013 glass of wine maybe twice/month, if that   Drug use: No   Sexual activity: Not Currently  [place] Topics Concern   Not on file  Social History Narrative   Not on file   Social Drivers of Health   Financial Resource Strain: Not on file  Food Insecurity: No Food Insecurity (10/08/2023)   Hunger Vital Sign    Worried About Running Out of Food in the Last Year: Never true    Ran Out of Food in the Last Year: Never true  Transportation Needs: No Transportation Needs (10/08/2023)   PRAPARE - Administrator, Civil Service (Medical): No    Lack of Transportation (Non-Medical): No  Physical Activity: Not on file  Stress: Not on file  Social Connections: Not on file  Intimate Partner Violence: Not At Risk (10/08/2023)   Humiliation, Afraid, Rape, and Kick questionnaire    Fear of Current or Ex-Partner: No    Emotionally Abused: No    Physically Abused: No    Sexually Abused: No   Family History  Problem Relation Age of Onset   Liver cancer Mother    Wt Readings from Last 3 Encounters:  11/11/23 104.8 kg (231 lb)  10/21/23 104.9 kg (231 lb 4.2 oz)  10/12/23 108.2 kg (238 lb 8.6 oz)   There were no vitals taken for this visit.  PHYSICAL EXAM: General:  NAD. No resp difficulty, walked into clinic HEENT: Normal Neck: Supple. No JVD. Cor: Regular rate & rhythm. No rubs, gallops, 2/6 TR Lungs: Clear Abdomen: Soft, nontender, nondistended.  Extremities: No cyanosis, clubbing, rash, edema Neuro: Alert & oriented x 3, moves all 4 extremities w/o difficulty. Affect pleasant.  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   -  Echo 8/25: EF 20-25%, LV with GHK, RV severely reduced, LA/RA mod dilated, severe MR/TR - Cath 8/25: Minimal non-obs CAD (20%RCA), elevated volume and low CI (thermo) 1.9 - cMRI 8/25: LVEF 28% RVEF 32% No LGE - NYHA I, volume looks good today. - Stop losartan . - Start Entresto  24/26 mg bid - Continue Lasix  40 mg PRN - Continue spiro 25 mg daily - Continue digoxin  0.125 mcg/daily - Continue Jardiance  10 mg daily - No b-blocker yet - Labs today - Repeat echo in 3 months. - Discussed CR, but she is already active with her water  aerobics, so will  defer.   PE - CT consistent with RLL subsegmental embolus - Continue Eliquis . No bleeding issues - CBC today   Valvular heart disease - Severe MR and TR on echo this admission - HF optimization as above - Repeat echo in 3 months   4. Hx bilateral breast cancer - s/p mastectomy and BSO - s/p adriamycin and cytoxan 96', taxotere  carboplatin  and taxol  '15-'16 - Followed by Dr. Odean   5. CAD - LHC 8/25 with minimal CAD - No chest pain - No asa with AC - Continue atorvastatin  40 mg daily - Check LFTs/lipids in 6-8 weeks   Follow up in 3 weeks with PharmD, 6 weeks with APP and 3 months with Dr. Cherrie + echo  [place], FNP-BC 11/26/23

## 2023-12-01 ENCOUNTER — Telehealth (HOSPITAL_COMMUNITY): Payer: Self-pay

## 2023-12-01 NOTE — Telephone Encounter (Signed)
 Called to confirm/remind patient of their appointment at the [facility] on 12/02/23.   Appointment:   [] Confirmed  [x] Left mess   [] No answer/No voice mail  [] VM Full/unable to leave message  [] Phone not in service  And  to bring in all medications and/or complete list.

## 2023-12-02 ENCOUNTER — Ambulatory Visit (HOSPITAL_COMMUNITY): Payer: Self-pay | Admitting: Family Medicine

## 2023-12-02 ENCOUNTER — Ambulatory Visit (HOSPITAL_COMMUNITY)
Admission: RE | Admit: 2023-12-02 | Discharge: 2023-12-02 | Disposition: A | Discharge status: Home / Self Care | Source: Ambulatory Visit | Attending: Family Medicine | Admitting: Family Medicine

## 2023-12-02 ENCOUNTER — Encounter (HOSPITAL_COMMUNITY): Payer: Self-pay

## 2023-12-02 VITALS — BP 118/72 | HR 57 | Ht 67.0 in | Wt 228.0 lb

## 2023-12-02 DIAGNOSIS — I2699 Other pulmonary embolism without acute cor pulmonale: Secondary | ICD-10-CM | POA: Diagnosis not present

## 2023-12-02 DIAGNOSIS — Z9221 Personal history of antineoplastic chemotherapy: Secondary | ICD-10-CM | POA: Insufficient documentation

## 2023-12-02 DIAGNOSIS — I340 Nonrheumatic mitral (valve) insufficiency: Secondary | ICD-10-CM | POA: Insufficient documentation

## 2023-12-02 DIAGNOSIS — Z853 Personal history of malignant neoplasm of breast: Secondary | ICD-10-CM | POA: Insufficient documentation

## 2023-12-02 DIAGNOSIS — Z7901 Long term (current) use of anticoagulants: Secondary | ICD-10-CM | POA: Insufficient documentation

## 2023-12-02 DIAGNOSIS — I361 Nonrheumatic tricuspid (valve) insufficiency: Secondary | ICD-10-CM | POA: Diagnosis not present

## 2023-12-02 DIAGNOSIS — I2510 Atherosclerotic heart disease of native coronary artery without angina pectoris: Secondary | ICD-10-CM | POA: Diagnosis not present

## 2023-12-02 DIAGNOSIS — Z90722 Acquired absence of ovaries, bilateral: Secondary | ICD-10-CM | POA: Diagnosis not present

## 2023-12-02 DIAGNOSIS — Z87891 Personal history of nicotine dependence: Secondary | ICD-10-CM | POA: Insufficient documentation

## 2023-12-02 DIAGNOSIS — I5022 Chronic systolic (congestive) heart failure: Secondary | ICD-10-CM | POA: Insufficient documentation

## 2023-12-02 DIAGNOSIS — Z9013 Acquired absence of bilateral breasts and nipples: Secondary | ICD-10-CM | POA: Diagnosis not present

## 2023-12-02 DIAGNOSIS — I38 Endocarditis, valve unspecified: Secondary | ICD-10-CM

## 2023-12-02 DIAGNOSIS — Z79899 Other long term (current) drug therapy: Secondary | ICD-10-CM | POA: Insufficient documentation

## 2023-12-02 DIAGNOSIS — I2693 Single subsegmental pulmonary embolism without acute cor pulmonale: Secondary | ICD-10-CM | POA: Insufficient documentation

## 2023-12-02 LAB — COMPREHENSIVE METABOLIC PANEL WITH GFR
ALT: 28 U/L (ref 0–44)
AST: 24 U/L (ref 15–41)
Albumin: 3.6 g/dL (ref 3.5–5.0)
Alkaline Phosphatase: 66 U/L (ref 38–126)
Anion gap: 11 (ref 5–15)
BUN: 11 mg/dL (ref 6–20)
CO2: 23 mmol/L (ref 22–32)
Calcium: 9.2 mg/dL (ref 8.9–10.3)
Chloride: 104 mmol/L (ref 98–111)
Creatinine, Ser: 0.81 mg/dL (ref 0.44–1.00)
GFR, Estimated: >60 mL/min (ref >60)
Glucose, Bld: 91 mg/dL (ref 70–99)
Potassium: 4.3 mmol/L (ref 3.5–5.1)
Sodium: 138 mmol/L (ref 135–145)
Total Bilirubin: 1 mg/dL (ref 0.0–1.2)
Total Protein: 7.1 g/dL (ref 6.5–8.1)

## 2023-12-02 LAB — LIPID PANEL
Cholesterol: 117 mg/dL (ref 0–200)
HDL: 50 mg/dL (ref >40)
LDL Cholesterol: 55 mg/dL (ref 0–99)
Total CHOL/HDL Ratio: 2.3 ratio
Triglycerides: 58 mg/dL (ref <150)
VLDL: 12 mg/dL (ref 0–40)

## 2023-12-02 MED ORDER — SPIRONOLACTONE 25 MG PO TABS: 25.0000 mg | ORAL_TABLET | Freq: Every day | ORAL | 5 refills | Status: AC

## 2023-12-02 MED ORDER — SACUBITRIL-VALSARTAN 24-26 MG PO TABS: 1.0000 | ORAL_TABLET | Freq: Two times a day (BID) | ORAL | 11 refills | Status: DC

## 2023-12-02 MED ORDER — EMPAGLIFLOZIN 10 MG PO TABS: 10.0000 mg | ORAL_TABLET | Freq: Every day | ORAL | 5 refills | Status: AC

## 2023-12-02 MED ORDER — CARVEDILOL 3.125 MG PO TABS: 3.1250 mg | ORAL_TABLET | Freq: Two times a day (BID) | ORAL | 5 refills | Status: AC

## 2023-12-02 MED ORDER — ATORVASTATIN CALCIUM 40 MG PO TABS: 40.0000 mg | ORAL_TABLET | Freq: Every day | ORAL | 5 refills | Status: AC

## 2023-12-02 MED ORDER — DIGOXIN 125 MCG PO TABS: 0.1250 mg | ORAL_TABLET | Freq: Every day | ORAL | 5 refills | Status: AC

## 2023-12-02 MED ORDER — APIXABAN 5 MG PO TABS: 5.0000 mg | ORAL_TABLET | Freq: Two times a day (BID) | ORAL | 5 refills | Status: AC

## 2023-12-02 NOTE — Patient Instructions (Signed)
Medication Changes:  No Changes In Medications at this time.   Lab Work:  Labs done today, your results will be available in MyChart, we will contact you for abnormal readings.  Follow-Up in: AS SCHEDULED WITH DR. Gala Romney   At the [facility], you and your health needs are our priority. We have a designated team specialized in the treatment of Heart Failure. This Care Team includes your primary Heart Failure Specialized Cardiologist (physician), Advanced Practice Providers (APPs- Physician Assistants and Nurse Practitioners), and Pharmacist who all work together to provide you with the care you need, when you need it.   You may see any of the following providers on your designated Care Team at your next follow up:  Dr. Arvilla Meres Dr. Marca Ancona Dr. Dorthula Nettles Dr. Theresia Bough Tonye Becket, NP Robbie Lis, [place] [facility] [place], [place] Brynda Peon, NP [place] Lee, NP Karle Plumber, PharmD   Please be sure to bring in all your medications bottles to every appointment.   Need to Contact us:  If you have any questions or concerns before your next appointment please send [place] a message through [place] or call our office at [phone].    TO LEAVE A MESSAGE FOR THE NURSE SELECT OPTION 2, PLEASE LEAVE A MESSAGE INCLUDING: YOUR NAME DATE OF BIRTH CALL BACK NUMBER REASON FOR CALL**this is important as we prioritize the call backs  YOU WILL RECEIVE A CALL BACK THE SAME DAY AS LONG AS YOU CALL BEFORE 4:00 PM

## 2023-12-12 ENCOUNTER — Inpatient Hospital Stay: Attending: Medical Genetics

## 2023-12-12 ENCOUNTER — Other Ambulatory Visit: Payer: Self-pay

## 2023-12-12 ENCOUNTER — Inpatient Hospital Stay

## 2023-12-12 DIAGNOSIS — Z803 Family history of malignant neoplasm of breast: Secondary | ICD-10-CM

## 2023-12-12 DIAGNOSIS — C50912 Malignant neoplasm of unspecified site of left female breast: Secondary | ICD-10-CM

## 2023-12-12 DIAGNOSIS — Z8042 Family history of malignant neoplasm of prostate: Secondary | ICD-10-CM

## 2023-12-12 DIAGNOSIS — Z8041 Family history of malignant neoplasm of ovary: Secondary | ICD-10-CM

## 2023-12-12 DIAGNOSIS — C50919 Malignant neoplasm of unspecified site of unspecified female breast: Secondary | ICD-10-CM

## 2023-12-12 DIAGNOSIS — Z15068 Genetic susceptibility to other malignant neoplasm of digestive system: Secondary | ICD-10-CM

## 2023-12-12 LAB — GENETIC SCREENING ORDER

## 2023-12-16 ENCOUNTER — Encounter: Payer: Self-pay | Admitting: Oncology

## 2023-12-16 DIAGNOSIS — Z803 Family history of malignant neoplasm of breast: Secondary | ICD-10-CM | POA: Insufficient documentation

## 2023-12-16 DIAGNOSIS — Z8042 Family history of malignant neoplasm of prostate: Secondary | ICD-10-CM | POA: Insufficient documentation

## 2023-12-16 DIAGNOSIS — Z8041 Family history of malignant neoplasm of ovary: Secondary | ICD-10-CM | POA: Insufficient documentation

## 2023-12-16 NOTE — Progress Notes (Addendum)
 REFERRING PROVIDER: Ransom Other, MD [address] [place],  [place] [address]  PRIMARY PROVIDER:  Ransom Other, MD  PRIMARY REASON FOR VISIT:  1. Malignant neoplasm of left female breast, unspecified estrogen receptor status, unspecified site of breast (HCC)   2. BRCA positive   3. Family history of ovarian cancer   4. Family history of breast cancer   5. Family history of prostate cancer    HISTORY OF PRESENT ILLNESS:   Heidi Sexton, a 63 y.o. female, was seen for a [facility] cancer genetics consultation as she attended her daughter's visit today. She has a personal history of two primary breast cancer diagnoses and has a known pathogenic BRCA1 variant.  Original Genetic Testing    Diagnosis: In May 1996, at the age of 88, Heidi Sexton was diagnosed with breast cancer and treatment included a left mastectomy. She was diagnosed with breast cancer again in August 2015 in the right breast and completed a right mastectomy.  CANCER HISTORY:  Oncology History  Breast cancer, left breast (HCC)  07/17/1994 Initial Diagnosis   Left breast cancer when she was [redacted] weeks pregnant 4 cm poorly differentiated IDC and comedocarcinoma, ER/PR negative (HER-2 was not tested in 1996); Adriamycin 2 cycles, T2 N1b stage IIB   07/26/1994 Surgery   Left mastectomy 12/23 lymph nodes positive, IDC with DCIS   08/19/1994 - 12/20/1994 Chemotherapy   Adriamycin and Cytoxan 1 followed by 3 cycles of Adriamycin and Taxol  followed by high-dose chemotherapy with Cytoxan, C PDP an autologous transplant at [phone] (Dr J.Vredenburgh) , and local radiation on left   Breast cancer of upper-outer quadrant of right female breast (HCC)  09/24/2013 Initial Diagnosis   Right breast cancer: 9 mm right breast UOQ, EUS biopsy: Invasive mammary cancer, triple negative, Ki-67 87%    Miscellaneous   BRCA1 and 2 variants   11/16/2013 Surgery   Right mastectomy with immediate reconstruction with a  right latissimus myocutaneous flap with saline implant: Grade 3 IDC 0.6 cm 0/4 lymph nodes, T1 BN 0 stage IA   12/28/2013 - 05/17/2014 Chemotherapy   Taxotere  carboplatin  4 followed by Taxol  7 stopped for neuropathy    RISK FACTORS:  Menarche was at age 48.  First live birth at age 28.  Oophorectomy/Hysterectomy: Yes;TAH/BSO in 08/2009  Menopausal status: postmenopausal.  Colonoscopy: yes; 06/05/2015. Final Diagnosis (06/05/2015) - TUBULAR ADENOMA, ONE FRAGMENT. - BENIGN POLYPOID COLORECTAL MUCOSA, THREE FRAGMENTS. - NO HIGH GRADE DYSPLASIA OR MALIGNANCY IDENTIFIED  Past Medical History:  Diagnosis Date   Anxiety    h/o panic attacks - relative to migranie headaches -pt denies? anxiety related to Chemotherapy   Breast cancer [facility])    both breasts 4'16- Cancer rt.breast '2015-with chemo last Tx. 4'16-Dr. Livesay follows.   Family history of anesthesia complication    daughter got nauseated also   Family history of breast cancer    Family history of ovarian cancer    Family history of prostate cancer    H/O seasonal allergies    OTC   Heart murmur    during pregnancy only- then resolved    Migraine    maybe 2/yr (11/16/2013)   PONV (postoperative nausea and vomiting)    Wears glasses     Past Surgical History:  Procedure Laterality Date   ABDOMINAL HYSTERECTOMY  2011   bso   BREAST BIOPSY Left 1996   BREAST BIOPSY Right 09/2013   BREAST RECONSTRUCTION Left 2001   BREAST RECONSTRUCTION Right 11/16/2013  Procedure: BREAST RECONSTRUCTION;  Surgeon: Alm Sick, MD;  [facility] OR;  Service: Plastics;  Laterality: Right;   BREAST SURGERY  1996   left mast-axillary node dissection   COLONOSCOPY WITH PROPOFOL  N/A 06/05/2015   Procedure: COLONOSCOPY WITH PROPOFOL ;  Surgeon: Gladis MARLA Louder, MD;  Location: [facility];  Service: Endoscopy;  Laterality: N/A;   LATISSIMUS FLAP TO BREAST Right 11/16/2013   Procedure: LATISSIMUS FLAP TO BREAST WITH SALINE IMPLANTS;   Surgeon: Alm Sick, MD;  Location: [facility] OR;  Service: Plastics;  Laterality: Right;/ Tram Flap on Left.   MASTECTOMY Left 1996   MASTECTOMY COMPLETE / SIMPLE W/ SENTINEL NODE BIOPSY Right 11/16/2013   MASTECTOMY W/ SENTINEL NODE BIOPSY Right 11/16/2013   Procedure: RIGHT MASTECTOMY WITH SENTINEL LYMPH NODE BIOPSY;  Surgeon: Jina Nephew, MD;  Location: [facility] OR;  Service: General;  Laterality: Right;   PORT-A-CATH REMOVAL  1996   PORT-A-CATH REMOVAL Right 09/26/2015   Procedure: REMOVAL PORT-A-CATH;  Surgeon: Jina Nephew, MD;  Location: [facility];  Service: General;  Laterality: Right;   PORTACATH PLACEMENT  1996   PORTACATH PLACEMENT Right 12/27/2013   Procedure: INSERTION PORT-A-CATH;  Surgeon: Jina Nephew, MD;  Location: [facility];  Service: General;  Laterality: Right;   RECONSTRUCTION BREAST W/ LATISSIMUS DORSI FLAP Right 11/16/2013   w/saline implants   RIGHT/LEFT HEART CATH AND CORONARY ANGIOGRAPHY N/A 10/10/2023   Procedure: RIGHT/LEFT HEART CATH AND CORONARY ANGIOGRAPHY;  Surgeon: Cherrie Toribio SAUNDERS, MD;  Location: [facility];  Service: Cardiovascular;  Laterality: N/A;   TUNNELED VENOUS CATHETER PLACEMENT  1996   [place]; removed later in 1996   Social History   Socioeconomic History   Marital status: Married    Spouse name: Not on file   Number of children: Not on file   Years of education: Not on file   Highest education level: Not on file  Occupational History   Not on file  Tobacco Use   Smoking status: Former    Current packs/day: 0.00    Average packs/day: 0.1 packs/day for 15.0 years (1.5 ttl pk-yrs)    Types: Cigarettes    Start date: 11/12/1978    Quit date: 11/11/1993    Years since quitting: 30.1   Smokeless tobacco: Never  Substance and Sexual Activity   Alcohol use: Yes    Comment: 11/16/2013 glass of wine maybe twice/month, if that   Drug use: No   Sexual activity: Not Currently  Other Topics Concern   Not on file   Social History Narrative   Not on file   Social Drivers of Health   Financial Resource Strain: Not on file  Food Insecurity: No Food Insecurity (10/08/2023)   Hunger Vital Sign    Worried About Running Out of Food in the Last Year: Never true    Ran Out of Food in the Last Year: Never true  Transportation Needs: No Transportation Needs (10/08/2023)   PRAPARE - Administrator, Civil Service (Medical): No    Lack of Transportation (Non-Medical): No  Physical Activity: Not on file  Stress: Not on file  Social Connections: Not on file     FAMILY HISTORY:  We obtained a detailed, 4-generation family history pasted below.   Mariaeduarda Defranco and numerous family members have completed genetic testing and have been found to have a pathogenetic BRCA1 variant.  Significant diagnoses are listed below: Family History  Problem Relation Age of Onset   Liver cancer Mother  Breast cancer Maternal Aunt    Ovarian cancer Maternal Aunt    Breast cancer Maternal Aunt        Metastatic Breast Cancer   Prostate cancer Maternal Uncle    BRCA 1/2 Maternal Cousin        BRCA1+ Metastatic Breast Cancer   Breast cancer Maternal Cousin      GENETIC COUNSELING ASSESSMENT: Raye Wiens is a 63 y.o. female with a personal and family history of breast cancer which is consistent with Hereditary Breast and Ovarian Cancer Syndrome. We, therefore, discussed and recommended the following at today's visit.   DISCUSSION: We discussed that, her personal and family history is consistent with Hereditary Breast and Ovarian Cancer Syndrome. While [employer] personal and family history can be explained by the know BRCA1 variant she and several members of her family have been identified to have. We discussed the American Society of Breast Surgeons recommendation for updated genetic testing individuals with breast cancer who completed genetic testing that only analyzed the BRCA1 and BRCA2 genes. Cancer  genetic testing ordered prior to 2014 largely only included analysis for the BRCA1 and BRCA2 genes. Since Asley's testing was originally completed in 2004 and only included analysis of 7 variants in the BRCA1 and BRCA2 genes she qualified for updated testing.  We discussed genetic testing, including the appropriate family members to test, the process of testing, insurance coverage and turn-around-time for results. We discussed the implications of a negative, positive, carrier and/or variant of uncertain significant result. Malala Trenkamp  was offered a common hereditary cancer panel (40 genes) and an expanded pan-cancer panel (77 genes). Sharicka Pogorzelski was informed of the benefits and limitations of each panel, including that expanded pan-cancer panels contain genes that do not have clear management guidelines at this point in time.  We also discussed that as the number of genes included on a panel increases, the chances of variants of uncertain significance increases.  GENETIC TESTING NATIONAL CRITERIA: Based on Shawn Monforte's personal history of breast cancer at 24 and and completion of genetic testing with analysis of only the BRCA1 and BRCA2 genes shes meets medical criteria for genetic testing based on the [employer] (NCCN) guidelines. Despite that she meets criteria, she may still have an out of pocket cost. We discussed that if her out of pocket cost for testing is over $100, the laboratory will call and confirm whether she wants to proceed with testing.  If the out of pocket cost of testing is less than $100 she will be billed by the genetic testing laboratory.   GENETIC TESTING CONSENT:  After considering the risks, benefits, and limitations, Raymona Boss provided informed consent to pursue updated genetic testing. A blood sample was sent to [facility] for analysis of the CancerNext-Expanded+RNA Panel. Results should be available within approximately 2-3  weeks' time, at which point they will be disclosed by telephone to Shanda Greek , as will any additional recommendations warranted by these results. Velvia Mehrer will receive a summary of her genetic counseling visit and a copy of her results once available. This information will also be available in Epic.  [facility] CancerNext-Expanded + RNAinsight gene panel which includes sequencing, rearrangement, and RNA analysis for the following 77 genes: AIP, ALK, APC, ATM, AXIN2, BAP1, BARD1, BMPR1A, BRCA1, BRCA2, BRIP1, CDC73, CDH1, CDK4, CDKN1B, CDKN2A, CEBPA, CHEK2, CTNNA1, DDX41, DICER1, ETV6, FH, FLCN, GATA2, LZTR1, MAX, MBD4, MEN1, MET, MLH1, MSH2, MSH3, MSH6, MUTYH, NF1, NF2, NTHL1, PALB2, PHOX2B, PMS2, POT1, PRKAR1A, PTCH1,  PTEN, RAD51C, RAD51D, RB1, RET, RPS20, RUNX1, SDHA, SDHAF2, SDHB, SDHC, SDHD, SMAD4, SMARCA4, SMARCB1, SMARCE1, STK11, SUFU, TMEM127, TP53, TSC1, TSC2, VHL, and WT1 (sequencing and deletion/duplication); EGFR, HOXB13, KIT, MITF, PDGFRA, POLD1, and POLE (sequencing only); EPCAM and GREM1 (deletion/duplication only).   GENETIC INFORMATION NONDISCRIMINATION ACT (GINA): We discussed that some people do not want to undergo genetic testing due to fear of genetic discrimination.  The Genetic Information Nondiscrimination Act (GINA) was signed into federal law in 2008. GINA prohibits health insurers and most employers from discriminating against individuals based on genetic information (including the results of genetic tests and family history information). According to GINA, health insurance companies cannot consider genetic information to be a preexisting condition, nor can they use it to make decisions regarding coverage or rates. GINA also makes it illegal for most employers to use genetic information in making decisions about hiring, firing, promotion, or terms of employment. It is important to note that GINA does not offer protections for life insurance, disability insurance, or long-term  care insurance. GINA does not apply to those in the [employer], those who work for companies with less than 15 employees, and new life insurance or long-term disability insurance policies.  Health status due to a cancer diagnosis is not protected under GINA. More information about GINA can be found by visiting eliteclients.be.  Lastly, we encouraged Paityn Balsam to remain in contact with cancer genetics annually so that we can continuously update the family history and inform her of any changes in cancer genetics and testing that may be of benefit for this family.   Chau Savell questions were answered to her satisfaction today. Our contact information was provided should additional questions or concerns arise. Thank you for the referral and allowing us  to share in the care of your patient.   Resources:  Dara Beidleman was provided with the following:  [facility] Billing information  [facility] Hereditary Cancer Testing Patient Guide [facility] Patient Assistance Information Sheet  PLAN:  Testing Ordered: CancerNext-Expanded+RNA Panel Clinic Note Faxed/Routed to Shanda Learned PCP Ransom Other, MD   Santana Fryer, MS, [facility]  Certified Genetic Counselor  Email: [email or web address]  Phone: [phone]  I personally spent a total of 15 minutes in the care of the patient today including counseling and educating, placing orders, and documenting clinical information in the EHR.  The patient was seen during her daughter's scheduled appointment.  Drs. Lanny Stalls, and/or Gudena were available for questions, if needed. _______________________________________________________________________ For Office Staff:  Number of people involved in session: 2 Was an Intern/ student involved with case: no

## 2024-01-08 ENCOUNTER — Telehealth: Payer: Self-pay

## 2024-01-08 NOTE — Telephone Encounter (Signed)
 I contacted  Ms. Hechavarria to discuss her genetic testing results. Her testing identified her known pathogenic variant in the BRCA1 genes. Consistent with her personal and family history of Hereditary Breast and Ovarian Cancer.   Additionally the testing identified a variant of uncertain significance in the ATM gene.  The test report will be scanned into EPIC and will be located under the Molecular Pathology section of the Results Review tab.  A portion of the result report is included below for reference.     Santana Fryer, MS, CGC  Certified Genetic Counselor  Email: [email or web address]  Phone: [phone]

## 2024-01-12 ENCOUNTER — Encounter: Payer: Self-pay | Admitting: Licensed Clinical Social Worker

## 2024-01-27 ENCOUNTER — Ambulatory Visit: Payer: Self-pay

## 2024-01-28 ENCOUNTER — Encounter: Payer: Self-pay | Admitting: Oncology

## 2024-01-28 DIAGNOSIS — Z1379 Encounter for other screening for genetic and chromosomal anomalies: Secondary | ICD-10-CM | POA: Insufficient documentation

## 2024-01-28 NOTE — Progress Notes (Signed)
 GENETIC TEST RESULTS  HPI:  Heidi Sexton was previously seen in the  Cancer Genetics clinic due to a personal history of breast cancer and family history of breast and ovarian cancer. She previously tested positive for a pathogenic BRCA1 variant c.5072C>T (p.T1691I) in 2015. At that time she was tested for BRCA1/2 variants and she pursued updated comprehensive testing during her daughter's genetic counseling appointment. regarding a hereditary predisposition to cancer. Please refer to our prior cancer genetics clinic note for more information regarding our discussion, assessment and recommendations, at the time. Heidi Sexton recent genetic test results were disclosed to her, as were recommendations warranted by these results. These results and recommendations are discussed in more detail below.  CANCER HISTORY:  Oncology History  Breast cancer, left breast (HCC)  07/17/1994 Initial Diagnosis   Left breast cancer when she was [redacted] weeks pregnant 4 cm poorly differentiated IDC and comedocarcinoma, ER/PR negative (HER-2 was not tested in 1996); Adriamycin 2 cycles, T2 N1b stage IIB   07/26/1994 Surgery   Left mastectomy 12/23 lymph nodes positive, IDC with DCIS   08/19/1994 - 12/20/1994 Chemotherapy   Adriamycin and Cytoxan 1 followed by 3 cycles of Adriamycin and Taxol  followed by high-dose chemotherapy with Cytoxan, C PDP an autologous transplant at [facility] [phone] (Dr J.Vredenburgh) , and local radiation on left   Breast cancer of upper-outer quadrant of right female breast (HCC)  09/24/2013 Initial Diagnosis   Right breast cancer: 9 mm right breast UOQ, EUS biopsy: Invasive mammary cancer, triple negative, Ki-67 87%    Miscellaneous   BRCA1 and 2 variants   11/16/2013 Surgery   Right mastectomy with immediate reconstruction with a right latissimus myocutaneous flap with saline implant: Grade 3 IDC 0.6 cm 0/4 lymph nodes, T1 BN 0 stage IA   12/28/2013 - 05/17/2014 Chemotherapy   Taxotere   carboplatin  4 followed by Taxol  7 stopped for neuropathy    FAMILY HISTORY:  Pedigree    Family History We obtained a detailed, 4-generation family history.  Significant diagnoses are listed below: Family History  Problem Relation Age of Onset   Liver cancer Mother    Breast cancer Maternal Aunt    Ovarian cancer Maternal Aunt    Breast cancer Maternal Aunt        Metastatic Breast Cancer   Prostate cancer Maternal Uncle    BRCA 1/2 Maternal Cousin        BRCA1+ Metastatic Breast Cancer   Breast cancer Maternal Cousin    GENETIC TESTING:  Heidi Sexton tested positive for the BRCA1 pathogenic variant that was previously detected in BRCA1 c.5072C>T (p.T1691I).  Genetic testing did identify a variant of uncertain significance (VUS) was identified in the ATM gene variant ATM c.2158C>T (p.R720C). At this time, it is unknown if this variant is associated with increased cancer risk or if this is a normal finding, but most variants such as this get reclassified to being inconsequential. It should not be used to make medical management decisions. With time, we suspect the lab will determine the significance of this variant, if any. If we do learn more about it, we will try to contact Heidi Sexton to discuss it further. However, it is important to stay in [place] with us  periodically and keep the address and phone number up to date.  The test report has been scanned into EPIC and is located under the Molecular Pathology section of the Results Review tab.  A portion of the result report is included below for reference. Genetic  testing reported out on 01/05/2024.    Clinical Information: Hereditary breast and ovarian cancer (HBOC) syndrome is characterized by an increased lifetime risk for, generally, adult-onset cancers including, breast, contralateral breast, female breast, ovarian, prostate, melanoma and pancreatic.  The cancers associated with BRCA1 are: Female breast cancer, 60-72% risk Up  to 73% of the breast cancers diagnosed in women with BRCA1 mutations are triple negative breast cancer In women with a history of breast cancer, the cumulative risk for contralateral breast cancer 5 years after breast cancer diagnosis is 15%. The cumulative 20- year risk is 40% or greater. Female breast cancer, up to a 1.2% risk Ovarian cancer, up to a 58% risk Pancreatic cancer, up to a 5% risk Prostate cancer, up to a 26% risk BRCA1 also has preliminary evidence for an association with melanoma Limited data suggest there may be a slightly increased risk of serous uterine cancer   Management Recommendations:  Breast Screening/Risk Reduction: Women (assigned female at birth): Breast cancer screening includes: Breast awareness beginning at age 89 Monthly self-breast examination beginning at age 70 Clinical breast examination every 6-12 months beginning at age 68 or at the age of the earliest diagnosed breast cancer in the family, if onset was before age 66 Annual breast MRI with contrast beginning at age 75-29 (or annual mammograms with consideration of tomosynthesis if MRI is unavailable), although the age to initiate screening may be individualized based on family history Annual mammogram beginning at age 57 until age 64 with consideration of tomosynthesis with continuation of annual breast MRI with contrast The option of prophylactic bilateral risk-reducing mastectomy (RRM), removal of the breast tissue before cancer develops, is the best option for significantly decreasing the risk of developing breast cancer. Studies have shown mastectomies reduce the risk of breast cancer by 90-95% in women with a BRCA1 mutation. Breast reconstruction can also be performed immediately following the mastectomy, depending on the type of reconstruction chosen. For women with a BRCA1 pathogenic or likely pathogenic variant who are treated for breast cancer and have not had a bilateral mastectomy, screening with  annual mammogram with consideration of tomosynthesis and breast MRI should continue as described above.  Males (assigned female at birth): Breast self-exam training and education starting at age 1 years Annual clinical breast exam starting at age 32 years  Consider annual mammogram starting at age 48 or 10 years before the earliest known female breast cancer in the family (whichever comes first).   Gynecological Cancer Screening/Risk Reduction: It is recommended that women with a BRCA1 mutation consider having a risk-reducing salpingo oophorectomy (RRSO), removal of the ovaries and fallopian tubes, between the ages of 44-40 or once childbearing is completed. Having a RRSO is estimated to reduce the risk of ovarian cancer by up to 96%. There is still a small risk of developing an ovarian-like cancer in the lining of the abdomen, called the peritoneum. Another benefit to having the ovaries removed is the risk reduction for breast cancer. If the ovaries are removed before menopause, the risk of developing breast cancer is reduced. Women undergoing a RRSO should be aware of the potential risks and benefits of concurrent hysterectomy. Hormone replacement therapy could be considered based on the physician's discretion. Individuals at risk for developing breast and ovarian cancer may benefit from the use of medication to reduce their risk for cancer. These medications are referred to as chemoprevention. For example, oral contraceptive use has been shown to reduce the risk of ovarian cancer by approximately 60% in  BRCA1 mutation carriers if taken for at least 5 years. This risk reduction remains even after discontinuation of oral contraceptives. Ovarian cancer screening is an option for women who chose not to have a RRSO or who, as of yet, have not completed their family. Current screening methods for ovarian cancer are neither sensitive nor specific, meaning that often early stage ovarian cancer cannot be  diagnosed through this screening.  Screening can also be falsely positive with no cancer present. For this reason, RRSO is recommended over screening. If ovarian cancer screening is recommended by your physician, it could include: CA-125 blood tests Transvaginal ultrasounds Clinical pelvic exams   Skin Cancer Screening and Risk Reduction: Regular skin self-examinations Individuals should notify their physicians of any changes to moles such as increasing in size, darkening in color, or other change in appearance. Annual skin examinations by a dermatologist  Follow sun-safety recommendations such as: Using UVA and UVB 30 SPF or higher sunscreen Avoiding sunburns Limiting sun exposure, especially during the hours of 11am-4pm  Wearing protective clothing and sunglasses Avoid using tanning beds For more information about the prevention of melanoma visit [email or web address]   Prostate Cancer Screening: Annual digital rectal exam (DRE) at age 16 Annual PSA blood test at age 38  Pancreatic Cancer Screening/Risk Reduction: Avoid smoking, heavy alcohol use, and obesity. It has been suggested that pancreatic cancer screening be limited to those with a family history of pancreatic cancer (first- or second-degree relative). Ideally, screening should be performed in experienced centers utilizing a multidisciplinary approach under research conditions. Recommended screening could include annual endoscopic ultrasound (preferred) and/or MRI of the pancreas starting at age 25 or 54 years younger than the earliest age diagnosis in the family. CA19-9 testing may be considered based on the physician's discretion.  Additional Considerations: Recent studies have suggested PARP inhibitors may be a beneficial chemotherapeutic agent for a subset of patients with BRCA1-associated breast, ovarian, prostate, and pancreatic cancers. Clinical trials are currently in process to determine if and how these agents can be  useful in the treatment of BRCA1 cancer patients. Patients of reproductive age should be made aware of options for prenatal diagnosis and assisted reproduction including pre-implantation genetic diagnosis.  This information is based on current understanding of the gene and may change in the future.  Implications for Family Members: Hereditary predisposition to cancer due to pathogenic variants in the BRCA1 gene has autosomal dominant inheritance. This means that an individual with a pathogenic variant has a 50% chance of passing the condition on to his/her offspring. Identification of a pathogenic variant allows for the recognition of at-risk relatives who can pursue testing for the familial variant.  Family members are encouraged to consider genetic testing for this familial pathogenic variant. As there are generally no childhood cancer risks associated with pathogenic variants in the BRCA1 gene, individuals in the family are not recommended to have testing until they reach at least 61 years of age. They may contact our office at [phone] for more information or to schedule an appointment.  Complimentary testing for the familial variant is available for 90 days.  Family members who live outside of the area are encouraged to find a genetic counselor in their area by visiting: [email or web address].  Resources: FORCE (Facing Our Risk of Cancer Empowered) is a resource for those with a hereditary predisposition to develop cancer.  FORCE provides information about risk reduction, advocacy, legislation, and clinical trials.  Additionally, FORCE provides a platform for collaboration and support; which includes: peer navigation,  message boards, local support groups, a toll-free helpline, research registry and recruitment, advocate training, published medical research, webinars, brochures, mastectomy photos, and more.  For more information, visit www.facingourrisk.org  We encouraged  Heidi Sexton to remain in contact with us  on an annual basis so we can update her personal and family histories, and let her know of advances in cancer genetics that may benefit the family. Our contact number was provided. Heidi Sexton questions were answered to her satisfaction today, and she knows she is welcome to call anytime with additional questions.   Santana Fryer, MS, CGC  Certified Genetic Counselor  Email: [email or web address]  Phone: [phone]

## 2024-01-30 ENCOUNTER — Encounter (HOSPITAL_COMMUNITY): Payer: Self-pay | Admitting: Internal Medicine

## 2024-01-30 ENCOUNTER — Inpatient Hospital Stay (HOSPITAL_COMMUNITY): Admission: RE | Admit: 2024-01-30 | Discharge: 2024-01-30 | Attending: Internal Medicine | Admitting: Internal Medicine

## 2024-01-30 ENCOUNTER — Ambulatory Visit (HOSPITAL_COMMUNITY)
Admission: RE | Admit: 2024-01-30 | Discharge: 2024-01-30 | Disposition: A | Source: Ambulatory Visit | Attending: *Deleted | Admitting: *Deleted

## 2024-01-30 VITALS — BP 126/60 | HR 59 | Ht 67.0 in | Wt 226.2 lb

## 2024-01-30 DIAGNOSIS — I5022 Chronic systolic (congestive) heart failure: Secondary | ICD-10-CM

## 2024-01-30 DIAGNOSIS — I2699 Other pulmonary embolism without acute cor pulmonale: Secondary | ICD-10-CM | POA: Diagnosis not present

## 2024-01-30 DIAGNOSIS — I38 Endocarditis, valve unspecified: Secondary | ICD-10-CM

## 2024-01-30 LAB — BASIC METABOLIC PANEL WITH GFR
Anion gap: 10 (ref 5–15)
BUN: 12 mg/dL (ref 8–23)
CO2: 23 mmol/L (ref 22–32)
Calcium: 9.1 mg/dL (ref 8.9–10.3)
Chloride: 103 mmol/L (ref 98–111)
Creatinine, Ser: 0.87 mg/dL (ref 0.44–1.00)
GFR, Estimated: >60 mL/min (ref >60)
Glucose, Bld: 81 mg/dL (ref 70–99)
Potassium: 4.6 mmol/L (ref 3.5–5.1)
Sodium: 136 mmol/L (ref 135–145)

## 2024-01-30 LAB — CBC
HCT: 39.2 % (ref 36.0–46.0)
Hemoglobin: 12.4 g/dL (ref 12.0–15.0)
MCH: 27.7 pg (ref 26.0–34.0)
MCHC: 31.6 g/dL (ref 30.0–36.0)
MCV: 87.7 fL (ref 80.0–100.0)
Platelets: 305 K/uL (ref 150–400)
RBC: 4.47 MIL/uL (ref 3.87–5.11)
RDW: 16.6 % — ABNORMAL HIGH (ref 11.5–15.5)
WBC: 5.5 K/uL (ref 4.0–10.5)
nRBC: 0 % (ref 0.0–0.2)

## 2024-01-30 LAB — BRAIN NATRIURETIC PEPTIDE: B Natriuretic Peptide: 49.7 pg/mL (ref 0.0–100.0)

## 2024-01-30 LAB — ECHOCARDIOGRAM COMPLETE: S' Lateral: 4.3 cm

## 2024-01-30 MED ORDER — SACUBITRIL-VALSARTAN 49-51 MG PO TABS: 1.0000 | ORAL_TABLET | Freq: Two times a day (BID) | ORAL | 3 refills | Status: AC

## 2024-01-30 NOTE — Progress Notes (Signed)
°  Echocardiogram 2D Echocardiogram has been performed.  Norleen ORN [facility] 01/30/2024, 10:27 AM

## 2024-01-30 NOTE — Addendum Note (Signed)
 Encounter addended by: Buell Powell HERO, RN on: 01/30/2024 12:02 PM  Actions taken: Pharmacy for encounter modified, Diagnosis association updated, Order list changed, Clinical Note Signed, Charge Capture section accepted

## 2024-01-30 NOTE — Patient Instructions (Signed)
 Medication Changes:  INCREASE Entresto  49/51 mg Twice daily   Lab Work:  Labs done today, your results will be available in MyChart, we will contact you for abnormal readings.  Special Instructions // Education:  Do the following things EVERYDAY: Weigh yourself in the morning before breakfast. Write it down and keep it in a log. Take your medicines as prescribed Eat low salt foods--Limit salt (sodium) to 2000 mg per day.  Stay as active as you can everyday Limit all fluids for the day to less than 2 liters   **IF you continue to have palpitations please let us  know and we can place a heart monitor     Advanced Cardiac Care Program Guidelines for over-the-counter treatments for mild illnesses  Medications You can Use  Brand Name Generic Name Uses Comments  Benadryl  Diphenhydramine  Cold/allergy symptoms Do not use any Bendaryl products that say congestion  Loratadine Claritin Cold/allergy symptoms --  Coricidin HBP Varies with formulation Cold/flu  symptoms --  Mussinex Guaifenesin Mucus removal  --  Delsym Dextromethorphan Cough suppression --  Robitussin Dextromethorphan and guaifenesin Cough suppression + mucus removal Do not use robitussin products that include PE, PM, or CF  Vicks VapoRub Camphor, eucalyptus oil, menthol Cough and congestion  --  Vicks 44 Varies with formulation Cough suppression Do not use Vicks 44 D  Saline Nasal Spray Saline Congestion relief --   Medications to AVOID  Brand Name Generic Name  Alka-Seltzer Calcium  carbonate + aspirin   Sudafed Pseudoephedrine  Comtrex Acetaminophen  + pseudoephedrine  Theraflu Acetaminophen  + phenylephrine  Dimetapp Brompheniramine + dextromethorphan + phenylephrine  Medicated nasal sprays Oxymetazoline, phenylephrine, etc.  Drixoral Dexbrompheniramine + pseudoephedrine  Chlor-Trimeton Chlorpheniramine  *AVOID AS THESE MEDICATIONS CAN CAUSE HIGH BLOOD PRESSURE AND HEART RATE  Common ingredients to avoid:  pseudoephedrine, epinephrine , phenylephrine, brompheniramine, chlorpheniramine (ALWAYS READ LABLES CAREFULLY)  THIS IS NOT A COMPREHENSIVE LIST - If unsure about a medicine, PLEASE CALL AND ASK!   Follow-Up in: 4 months  At the [facility], you and your health needs are our priority. We have a designated team specialized in the treatment of Heart Failure. This Care Team includes your primary Heart Failure Specialized Cardiologist (physician), Advanced Practice Providers (APPs- Physician Assistants and Nurse Practitioners), and Pharmacist who all work together to provide you with the care you need, when you need it.   You may see any of the following providers on your designated Care Team at your next follow up:  Dr. Toribio Fuel Dr. Ezra Shuck Dr. Odis Brownie Greig Mosses, NP Caffie Shed, [place] [facility] [place], [place] Beckey Coe, NP Jordan Lee, NP Tinnie Redman, PharmD   Please be sure to bring in all your medications bottles to every appointment.   Need to Contact Us :  If you have any questions or concerns before your next appointment please send us  a message through [place] or call our office at [phone].    TO LEAVE A MESSAGE FOR THE NURSE SELECT OPTION 2, PLEASE LEAVE A MESSAGE INCLUDING: YOUR NAME DATE OF BIRTH CALL BACK NUMBER REASON FOR CALL**this is important as we prioritize the call backs  YOU WILL RECEIVE A CALL BACK THE SAME DAY AS LONG AS YOU CALL BEFORE 4:00 PM

## 2024-01-30 NOTE — Progress Notes (Addendum)
 ADVANCED HF CLINIC NOTE  Primary Care: Ransom Other, MD HF Cardiologist: Dr. Cherrie  HPI: Heidi Sexton is a 63 y.o.[name] female with hx of bilateral breast cancer s/p mastectomy and chemo (adriamycin and cytoxan 96', taxotere  carboplatin  and taxol  15'-16'), s/p bilateral salpingo-oophorectomy, systolic heart failure and PE.   Admitted 8/25 with new systolic heart failure and PE. Echo showed EF 20-25%, LV with GHK, RV severely reduced, LA/RA mod dilated, severe MR/TR. CT showed RLL subsegmental embolus, started on AC. Diuresed with IV lasix . Underwent R/LHC showed minimal nonobstructive CAD, elevated volume with low CO. cMRI showed LVEF 28% RVEF 32% No LGE. GDMT titrated and she was discharged home, weight 238 lbs.  Today she returns for HF follow up. Feels very good. Denies CP or SOB No edema. Occasional brief palpitations. No problems with meds. Not requiring lasix   Echo today 01/30/24: EF 35-40% RV ok Mild MR/TR Personally reviewed   Family Hx: no family history of heart disease Social Hx: quit smoking 30 years ago; smoked THC until 05/2023  Cardiac Studies - cMRI (8/25): LVEF 28% RVEF 32% No LGE, moderate TR and MR  - R/LHC (8/25): mild, non-obs CAD; RA 9, PA 38/27 (30), PCWP 18, CO/CI (Fick) 7.0/3.2, PVR 1.7 WU, PAPi 1.2  - Echo (8/25): EF 20-25%, RV severely reduced, severe MR and TR  Past Medical History:  Diagnosis Date   Anxiety    h/o panic attacks - relative to migranie headaches -pt denies? anxiety related to Chemotherapy   Breast cancer [facility])    both breasts 4'16- Cancer rt.breast '2015-with chemo last Tx. 4'16-Dr. Livesay follows.   Family history of anesthesia complication    daughter got nauseated also   Family history of breast cancer    Family history of ovarian cancer    Family history of prostate cancer    H/O seasonal allergies    OTC   Heart murmur    during pregnancy only- then resolved    Migraine    maybe 2/yr (11/16/2013)   PONV  (postoperative nausea and vomiting)    Wears glasses     Current Outpatient Medications  Medication Sig Dispense Refill   apixaban  (ELIQUIS ) 5 MG TABS tablet Take 1 tablet (5 mg total) by mouth 2 (two) times daily. 60 tablet 5   atorvastatin  (LIPITOR) 40 MG tablet Take 1 tablet (40 mg total) by mouth daily. 30 tablet 5   carvedilol  (COREG ) 3.125 MG tablet Take 1 tablet (3.125 mg total) by mouth 2 (two) times daily. 60 tablet 5   Cholecalciferol (VITAMIN D) 2000 UNITS tablet Take 2,000 Units by mouth daily.     digoxin  (LANOXIN ) 0.125 MG tablet Take 1 tablet (0.125 mg total) by mouth daily. 30 tablet 5   empagliflozin  (JARDIANCE ) 10 MG TABS tablet Take 1 tablet (10 mg total) by mouth daily. 30 tablet 5   ferrous sulfate  325 (65 FE) MG EC tablet Take 1 tablet (325 mg total) by mouth daily with breakfast. 30 tablet 11   furosemide  (LASIX ) 40 MG tablet Take 1 tablet (40 mg total) by mouth daily as needed for fluid or edema. 30 tablet 0   gabapentin  (NEURONTIN ) 300 MG capsule TAKE 1 CAPSULE(300 MG) BY MOUTH THREE TIMES DAILY 90 capsule 2   sacubitril -valsartan  (ENTRESTO ) 24-26 MG Take 1 tablet by mouth 2 (two) times daily. 60 tablet 11   spironolactone  (ALDACTONE ) 25 MG tablet Take 1 tablet (25 mg total) by mouth daily. 30 tablet 5   No current facility-administered  medications for this encounter.   Allergies  Allergen Reactions   Adhesive [Tape] Hives   Aleve [Naproxen] Itching   Advil [Ibuprofen]     Itchy red face   Nsaids    Social History   Socioeconomic History   Marital status: Married    Spouse name: Not on file   Number of children: Not on file   Years of education: Not on file   Highest education level: Not on file  Occupational History   Not on file  Tobacco Use   Smoking status: Former    Current packs/day: 0.00    Average packs/day: 0.1 packs/day for 15.0 years (1.5 ttl pk-yrs)    Types: Cigarettes    Start date: 11/12/1978    Quit date: 11/11/1993    Years since  quitting: 30.2   Smokeless tobacco: Never  Substance and Sexual Activity   Alcohol use: Yes    Comment: 11/16/2013 glass of wine maybe twice/month, if that   Drug use: No   Sexual activity: Not Currently  Other Topics Concern   Not on file  Social History Narrative   Not on file   Social Drivers of Health   Tobacco Use: Medium Risk (01/30/2024)   Patient History    Smoking Tobacco Use: Former    Smokeless Tobacco Use: Never    Passive Exposure: Not on Actuary Strain: Not on file  Food Insecurity: No Food Insecurity (10/08/2023)   Epic    Worried About Programme Researcher, Broadcasting/film/video in the Last Year: Never true    Ran Out of Food in the Last Year: Never true  Transportation Needs: No Transportation Needs (10/08/2023)   Epic    Lack of Transportation (Medical): No    Lack of Transportation (Non-Medical): No  Physical Activity: Not on file  Stress: Not on file  Social Connections: Not on file  Intimate Partner Violence: Not At Risk (10/08/2023)   Epic    Fear of Current or Ex-Partner: No    Emotionally Abused: No    Physically Abused: No    Sexually Abused: No  Depression (PHQ2-9): Not on file  Alcohol Screen: Not on file  Housing: Low Risk (10/08/2023)   Epic    Unable to Pay for Housing in the Last Year: No    Number of Times Moved in the Last Year: 0    Homeless in the Last Year: No  Utilities: Not At Risk (10/08/2023)   Epic    Threatened with loss of utilities: No  Health Literacy: Not on file   Family History  Problem Relation Age of Onset   Liver cancer Mother    Breast cancer Maternal Aunt    Ovarian cancer Maternal Aunt    Breast cancer Maternal Aunt        Metastatic Breast Cancer   Prostate cancer Maternal Uncle    BRCA 1/2 Maternal Cousin        BRCA1+ Metastatic Breast Cancer   Breast cancer Maternal Cousin    Wt Readings from Last 3 Encounters:  01/30/24 102.6 kg (226 lb 3.2 oz)  12/02/23 103.4 kg (228 lb)  11/11/23 104.8 kg (231 lb)    BP 126/60   Pulse (!) 59   Ht 5' 7 (1.702 m)   Wt 102.6 kg (226 lb 3.2 oz)   SpO2 98%   BMI 35.43 kg/m   PHYSICAL EXAM: General:  Sitting up. No resp difficulty HEENT: normal Neck: supple. no JVD.  Cor: Regular rate &  rhythm. No rubs, gallops or murmurs. Lungs: clear Abdomen: obese soft, nontender, nondistended.Good bowel sounds. Extremities: no cyanosis, clubbing, rash, edema Neuro: alert & orientedx3, cranial nerves grossly intact. moves all 4 extremities w/o difficulty. Affect pleasant   ASSESSMENT & PLAN: Chronic systolic heart failure with severe RV dysfunction - Suspect chemo induced CM, last chemo was in 2016.   - Echo 8/25: EF 20-25%, LV with GHK, RV severely reduced, LA/RA mod dilated, severe MR/TR - Cath 8/25: Minimal non-obs CAD (20%RCA), elevated volume and low CI (thermo) 1.9 - cMRI 8/25: LVEF 28% RVEF 32% No LGE - Echo today 01/30/24: EF 35-40% RV ok Mild MR/TR Personally reviewed - NYHA I volume ok  - Increase Entresto  24/26 mg bid -> 49/51 bid - Continue Lasix  40 mg PRN - Continue spiro 25 mg daily - Continue digoxin  0.125 mcg/daily.  - Continue Jardiance  10 mg daily - Continue Coreg  3.125 mg bid - EF improving with GDMT - will continue   PE - CT consistent with RLL subsegmental embolus - Continue Eliquis  5 mg bid. No recent bleeding - suspect related to low EF. Will continue therapy for at least 6 months. If EF recovers fully can consider stopping blood thinner after that   Valvular heart disease - Severe MR and TR on echo this admission - Much improved on echo today   4. Hx bilateral breast cancer - s/p mastectomy and BSO - s/p adriamycin and cytoxan 96', taxotere  carboplatin  and taxol  '15-'16 - Followed by Dr. Odean   5. CAD - LHC 8/25 with minimal CAD - No s/s angina - No asa with AC - Continue atorvastatin  40 mg daily  6. Palpitations - mild. She wants to defer monitor for now - if persists will place Zio    7. Morbid obesity -  Body mass index is 35.43 kg/m. - consider GLP1RA  Aspyn Warnke, MD  11:18 AM

## 2024-03-23 DIAGNOSIS — Z1501 Genetic susceptibility to malignant neoplasm of breast: Secondary | ICD-10-CM | POA: Insufficient documentation

## 2024-06-03 ENCOUNTER — Ambulatory Visit (HOSPITAL_COMMUNITY)
